# Patient Record
Sex: Female | Born: 1965
Health system: Southern US, Community
[De-identification: ages and names within clinical notes are randomized; demographics above are authoritative.]

## PROBLEM LIST (undated history)

## (undated) DIAGNOSIS — F419 Anxiety disorder, unspecified: Secondary | ICD-10-CM

## (undated) DIAGNOSIS — I10 Essential (primary) hypertension: Secondary | ICD-10-CM

## (undated) DIAGNOSIS — E039 Hypothyroidism, unspecified: Secondary | ICD-10-CM

## (undated) DIAGNOSIS — F329 Major depressive disorder, single episode, unspecified: Secondary | ICD-10-CM

## (undated) DIAGNOSIS — R002 Palpitations: Secondary | ICD-10-CM

## (undated) DIAGNOSIS — M069 Rheumatoid arthritis, unspecified: Secondary | ICD-10-CM

## (undated) DIAGNOSIS — K219 Gastro-esophageal reflux disease without esophagitis: Secondary | ICD-10-CM

## (undated) DIAGNOSIS — F32A Depression, unspecified: Secondary | ICD-10-CM

## (undated) DIAGNOSIS — E119 Type 2 diabetes mellitus without complications: Secondary | ICD-10-CM

## (undated) HISTORY — PX: CHOLECYSTECTOMY: SHX55

## (undated) HISTORY — DX: Gastro-esophageal reflux disease without esophagitis: K21.9

## (undated) HISTORY — PX: BLADDER SURGERY: SHX569

## (undated) HISTORY — DX: Essential (primary) hypertension: I10

## (undated) HISTORY — DX: Palpitations: R00.2

## (undated) HISTORY — DX: Major depressive disorder, single episode, unspecified: F32.9

## (undated) HISTORY — DX: Hypothyroidism, unspecified: E03.9

## (undated) HISTORY — DX: Depression, unspecified: F32.A

## (undated) HISTORY — DX: Anxiety disorder, unspecified: F41.9

---

## 2001-03-02 ENCOUNTER — Emergency Department (HOSPITAL_COMMUNITY): Admission: EM | Admit: 2001-03-02 | Discharge: 2001-03-02 | Payer: Self-pay | Admitting: Emergency Medicine

## 2001-07-10 ENCOUNTER — Emergency Department (HOSPITAL_COMMUNITY): Admission: EM | Admit: 2001-07-10 | Discharge: 2001-07-10 | Payer: Self-pay | Admitting: Emergency Medicine

## 2002-04-05 ENCOUNTER — Encounter: Admission: RE | Admit: 2002-04-05 | Discharge: 2002-04-05 | Payer: Self-pay | Admitting: Pulmonary Disease

## 2003-01-15 ENCOUNTER — Emergency Department (HOSPITAL_COMMUNITY): Admission: EM | Admit: 2003-01-15 | Discharge: 2003-01-15 | Payer: Self-pay | Admitting: Emergency Medicine

## 2003-01-21 ENCOUNTER — Encounter: Admission: RE | Admit: 2003-01-21 | Discharge: 2003-01-21 | Payer: Self-pay | Admitting: Pulmonary Disease

## 2003-02-09 ENCOUNTER — Ambulatory Visit (HOSPITAL_COMMUNITY): Admission: RE | Admit: 2003-02-09 | Discharge: 2003-02-09 | Payer: Self-pay | Admitting: Pulmonary Disease

## 2004-08-10 ENCOUNTER — Ambulatory Visit (HOSPITAL_BASED_OUTPATIENT_CLINIC_OR_DEPARTMENT_OTHER): Admission: RE | Admit: 2004-08-10 | Discharge: 2004-08-10 | Payer: Self-pay | Admitting: Urology

## 2004-08-10 ENCOUNTER — Ambulatory Visit (HOSPITAL_COMMUNITY): Admission: RE | Admit: 2004-08-10 | Discharge: 2004-08-10 | Payer: Self-pay | Admitting: Urology

## 2005-06-07 ENCOUNTER — Ambulatory Visit: Payer: Self-pay | Admitting: Cardiology

## 2005-06-19 ENCOUNTER — Ambulatory Visit: Payer: Self-pay | Admitting: Cardiology

## 2007-04-16 ENCOUNTER — Encounter: Admission: RE | Admit: 2007-04-16 | Discharge: 2007-04-16 | Payer: Self-pay | Admitting: Endocrinology

## 2007-07-01 ENCOUNTER — Ambulatory Visit: Payer: Self-pay | Admitting: Cardiology

## 2007-07-07 ENCOUNTER — Ambulatory Visit: Payer: Self-pay | Admitting: Cardiology

## 2007-07-28 ENCOUNTER — Ambulatory Visit: Payer: Self-pay | Admitting: Cardiology

## 2010-08-21 NOTE — Assessment & Plan Note (Signed)
[facility]                          [facility] OFFICE NOTE   DUHA, ABAIR                        MRN:          [id number]  DATE:07/28/2007                            DOB:          07-18-1965    HISTORY OF PRESENT ILLNESS:  The patient is a 45 year old female with a  history of a prior CT angiogram which __________.  The patient was  admitted with atypical chest pain on July 01, 2007, with a myocardial  infarction.  She underwent stress testing which was essentially within  normal limits.  The patient has been doing well.  She reports occasional  palpitations but no chest pain.  She does describe an epigastric pain.  I have recommended for her to go see the gastroenterologist.   MEDICATIONS:  Benicar/hydrochlorothiazide 20/12.5 mg q.a.m.   PHYSICAL EXAMINATION:  VITAL SIGNS:  Blood pressure 126/84, heart rate  84, weight 234 pounds.  NECK:  Normal carotid upstroke.  No carotid bruits.  LUNGS:  Clear.  Breath sounds bilaterally.  HEART:  A regular rate and rhythm with normal S1 and S2.  No murmurs,  rubs or gallops.  ABDOMEN:  Soft, nontender.  No rebound or guarding.  Good bowel sounds.  EXTREMITIES:  No clubbing, cyanosis or edema.  NEUROLOGIC:  The patient is alert and oriented and grossly nonfocal.   PROBLEMS:  1. Sinus tachycardia.  2. Atypical chest pain.  3. A lot of epigastric pain.   PLAN:  1. The patient was recommended to go see Dr. Lionel December for the      recurrent epigastric pain.  This does not appear to be angina or      chest pain.  2. The patient was given a prescription for atenolol 12.5 mg p.o.      daily.  3. The patient can follow up with [place] in six months to one year.     Learta Codding, MD,FACC  Electronically Signed    GED/MedQ  DD: 07/28/2007  DT: 07/28/2007  Job #: [id number]   cc:   Doreen Beam, MD

## 2010-08-24 NOTE — Op Note (Signed)
Linda Richmond, Linda Richmond                 ACCOUNT NO.:  [id number]   MEDICAL RECORD NO.:  [id number]          PATIENT TYPE:  AMB   LOCATION:  [facility]                         FACILITY:  [facility]   PHYSICIAN:  Jamison Neighbor, M.D.  DATE OF BIRTH:  [birth year]   DATE OF PROCEDURE:  08/10/2004  DATE OF DISCHARGE:                                 OPERATIVE REPORT   SERVICE:  Urology.   PREOPERATIVE DIAGNOSES:  1.  Interstitial cystitis.  2.  Hematuria.   POSTOPERATIVE DIAGNOSES:  1.  Interstitial cystitis.  2.  Hematuria.   PROCEDURE:  Cystoscopy, urethral calibration, hydrodistention of the  bladder, Marcaine and Pyridium installation, Marcaine and Kenalog injection.   SURGEON:  Jamison Neighbor, M.D.   ANESTHESIA:  General.   COMPLICATIONS:  None.   DRAINS:  None.   BRIEF HISTORY:  This 45 year old female has a past history of chronic pelvic  pain that was evaluated and found to be consistent with interstitial  cystitis. The patient was diagnosed by cystoscopy and hydrodistention and  had such a complete and total resolution of her symptoms that she did not  return for over five years. The patient had no symptoms until recently when  she began to develop recurrence of her pelvic pain, bloating, distention as  well as some pain on the right hand side. The patient underwent upper tract  evaluation with CT which showed no signs of stones or any other source for  hematuria. The hematuria settled down but the patient's pain is persistent.  She is now to undergo repeat hydrodistention to determine if she actually  still has problems with interstitial cystitis. She understands the risks and  benefits of the procedure. Specifically she realizes there is no guarantee  that her pain will respond in the same way that it did the last time and  that if she does a response it may not last as long. She also understands  that there are options for installation therapy and oral therapy to treat  this  disease long-term. She gave full and informed consent.   DESCRIPTION OF PROCEDURE:  After successful induction of general anesthesia,  the patient was placed in the dorsal lithotomy position, prepped with  Betadine and draped in the usual sterile fashion. Careful bimanual  examination revealed no cystocele or enterocele but there was a modest  rectocele. There were no masses on bimanual exam. The urethra was normal in  its appearance. There was no evidence of stenosis or stricture. It accepted  a 56 female urethral sound. The cystoscope was inserted, the bladder was  carefully inspected and was free of any tumor or stones. Both ureteral  orifices are normal in configuration and location. The patient had no  abnormalities of the mucosa. Hydrodistention of the bladder was performed.  The bladder was distended for 10 minutes at a pressure of 100 cmH2O. When  the bladder was drained, glomerulations were seen throughout the bladder,  the bladder capacity was normal at 1000 mL, there were no ulcers seen. The  patient did not require a biopsy. A mixture of Marcaine  and Pyridium was  left within the bladder, Marcaine and Kenalog were injected periurethrally.  The patient received intraoperative Toradol, zofran and a B&O suppository.  She will be sent home with Lorcet plus, Pyridium plus and Macrobid due to  numerous other drug allergies. We note that because of her multiple  allergies, she was given gentamycin preoperatively as opposed to a more  standard antibiotic prior to the cystoscopy. The patient will return to see  me in followup. If she wishes to embark on therapy will start her on a  combination of installation therapy plus Elmiron and Atarax in combination  with appropriate agents for pain management such as Effexor, Lyrica, Elavil,  etc.      RJE/MEDQ  D:  08/10/2004  T:  08/10/2004  Job:  [id number]   cc:   Oneal Deputy. Juanetta Gosling, M.D.  [address]  [place]  [place] [address]  Fax:  [phone]

## 2013-09-27 ENCOUNTER — Encounter: Payer: Self-pay | Admitting: *Deleted

## 2013-09-28 ENCOUNTER — Encounter: Payer: Self-pay | Admitting: *Deleted

## 2013-09-30 ENCOUNTER — Encounter: Payer: Self-pay | Admitting: Cardiology

## 2013-09-30 ENCOUNTER — Ambulatory Visit (INDEPENDENT_AMBULATORY_CARE_PROVIDER_SITE_OTHER): Payer: BC Managed Care – PPO | Admitting: Cardiology

## 2013-09-30 VITALS — BP 110/77 | HR 92 | Ht 61.0 in | Wt 258.0 lb

## 2013-09-30 DIAGNOSIS — R002 Palpitations: Secondary | ICD-10-CM

## 2013-09-30 MED ORDER — METOPROLOL SUCCINATE ER 50 MG PO TB24: 50.0000 mg | ORAL_TABLET | Freq: Every day | ORAL | Status: DC

## 2013-09-30 NOTE — Progress Notes (Signed)
Clinical Summary Ms. Gravette is a 48 y.o.female seen today as a new patient for palpitations  1. Palpitations - started several years ago, recently has become more frequent. States in her mid 86s had severe troubles with fast heart rates with ER visit, was lopressor for several years. She remembers the term "PAT", likely parox atach, being used around that time. Symptoms quited down and eventually she came off lopressor   -has had feeling of heart racing, + lightheadedness. Can have some nausea. Can last up to 5-20 minutes. Occuring 2-3 times a day. Can often be brought on by stress, but not exclusive to stress.  - TSH 6.3 Free T4 1.05. Reports symptoms often worst with synthroid, now off - no coffee, no sodas, only decaf tea. No EtOH - can have cramping feeling in left chest, down to left arm. Last < 1 min. Can happen at rest or with exertion. Occurs several times a week. Nothing makes better or worst - started on Toprol XL 25mg  daily without change in symptoms.   Past Medical History  Diagnosis Date  . Hypertension   . Palpitations   . Depression   . Anxiety   . GERD (gastroesophageal reflux disease)   . Hypothyroidism      Allergies  Allergen Reactions  . Ancef [Cefazolin]   . Aspirin   . Ciprofloxacin   . Doxycycline   . Penicillins   . Sulfa Antibiotics      Current Outpatient Prescriptions  Medication Sig Dispense Refill  . ALPRAZolam (XANAX) 0.5 MG tablet Take 0.25 mg by mouth every morning. & 0.5 mg at HS      . levothyroxine (SYNTHROID, LEVOTHROID) 137 MCG tablet Take 137 mcg by mouth daily before breakfast.      . metoprolol succinate (TOPROL-XL) 25 MG 24 hr tablet Take 25 mg by mouth daily.      olmesartan-hydrochlorothiazide (BENICAR HCT) 20-12.5 MG per tablet Take 1 tablet by mouth daily.      . pantoprazole (PROTONIX) 40 MG tablet Take 40 mg by mouth daily.       No current facility-administered medications for this visit.     Past Surgical  History  Procedure Laterality Date  . Cholecystectomy    . Bladder surgery      X's 3 for interstitial cystitis     Allergies  Allergen Reactions  . Ancef [Cefazolin]   . Aspirin   . Ciprofloxacin   . Doxycycline   . Penicillins   . Sulfa Antibiotics       No family history on file.   Social History Ms. Penman has no tobacco history on file. Ms. Langenderfer has no alcohol history on file.   Review of Systems CONSTITUTIONAL: No weight loss, fever, chills, weakness or fatigue.  HEENT: Eyes: No visual loss, blurred vision, double vision or yellow sclerae.No hearing loss, sneezing, congestion, runny nose or sore throat.  SKIN: No rash or itching.  CARDIOVASCULAR:per HPI  RESPIRATORY: No shortness of breath, cough or sputum.  GASTROINTESTINAL: No anorexia, nausea, vomiting or diarrhea. No abdominal pain or blood.  GENITOURINARY: No burning on urination, no polyuria NEUROLOGICAL: No headache, dizziness, syncope, paralysis, ataxia, numbness or tingling in the extremities. No change in bowel or bladder control.  MUSCULOSKELETAL: No muscle, back pain, joint pain or stiffness.  LYMPHATICS: No enlarged nodes. No history of splenectomy.  PSYCHIATRIC: No history of depression or anxiety.  ENDOCRINOLOGIC: No reports of sweating, cold or heat intolerance. No polyuria or polydipsia.  Claudette Laws  Physical Examination p 92 bp 110/77 Wt 258 lbs BMI 49 Gen: resting comfortably, no acute distress HEENT: no scleral icterus, pupils equal round and reactive, no palptable cervical adenopathy,  CV: RRR, no m/r/g, no JVD, no carotid bruits Resp: Clear to auscultation bilaterally GI: abdomen is soft, non-tender, non-distended, normal bowel sounds, no hepatosplenomegaly MSK: extremities are warm, no edema.  Skin: warm, no rash Neuro:  no focal deficits Psych: appropriate affect   Diagnostic Studies 03/2010 Echo LVEF "normal", possible diastolic dysfunction  EKG: NSR   Assessment and Plan  1.  Palpitations - will obtain 7 day event monitor - will increase Toprol XL to 50mg  daily   F/u 3-4 weeks      , M.D., F.A.C.C.

## 2013-09-30 NOTE — Patient Instructions (Signed)
   Increase Toprol XL to 50mg  daily  Continue all other medications.   Your physician has recommended that you wear an 7 day event monitor. Event monitors are medical devices that record the heart's electrical activity. Doctors most often these monitors to diagnose arrhythmias. Arrhythmias are problems with the speed or rhythm of the heartbeat. The monitor is a small, portable device. You can wear one while you do your normal daily activities. This is usually used to diagnose what is causing palpitations/syncope (passing out). Office will contact with results via phone or letter.   Follow up in  3-4 weeks

## 2013-10-05 ENCOUNTER — Other Ambulatory Visit: Payer: Self-pay | Admitting: *Deleted

## 2013-10-05 DIAGNOSIS — R002 Palpitations: Secondary | ICD-10-CM

## 2013-10-12 DIAGNOSIS — R002 Palpitations: Secondary | ICD-10-CM

## 2013-10-20 ENCOUNTER — Telehealth: Payer: Self-pay | Admitting: Cardiology

## 2013-10-20 NOTE — Telephone Encounter (Signed)
Discussed below with patient.  Stated that we have already received her end of service report.  Advised to keep her follow up as scheduled.  Patient verbalized understanding.

## 2013-10-20 NOTE — Telephone Encounter (Signed)
She just mailed off her monitor   Would like to know if she needs to reschedule her upcoming appointment

## 2013-10-25 ENCOUNTER — Encounter: Payer: Self-pay | Admitting: Cardiology

## 2013-10-25 ENCOUNTER — Ambulatory Visit (INDEPENDENT_AMBULATORY_CARE_PROVIDER_SITE_OTHER): Payer: BC Managed Care – PPO | Admitting: Cardiology

## 2013-10-25 VITALS — BP 136/80 | HR 75 | Ht 62.0 in | Wt 256.0 lb

## 2013-10-25 DIAGNOSIS — R002 Palpitations: Secondary | ICD-10-CM

## 2013-10-25 NOTE — Patient Instructions (Signed)
Continue all current medications. Your physician wants you to follow up in:  1 year.  You will receive a reminder letter in the mail one-two months in advance.  If you don't receive a letter, please call our office to schedule the follow up appointment   

## 2013-10-25 NOTE — Progress Notes (Signed)
Clinical Summary [name] is a 48 y.o.female seen today for follow up of the following medical problems.   1. Palpitations  - started several years ago,  recently have become more frequent. States in her mid 67s had severe troubles with fast heart rates with ER visit, was lopressor for several years. She remembers the term "PAT", likely parox atach, being used around that time. Symptoms quieted down and eventually she came off lopressor  -TSH 6.3 Free T4 1.05. Reports symptoms often worst with synthroid, now off  - no coffee, no sodas, only decaf tea. No EtOH   - since last visit comleted 7 day event monitor which showed no arrhythmias. We increased her Toprol XL, she reports significant improvement in her symptoms.    Past Medical History  Diagnosis Date  . Hypertension   . Palpitations   . Depression   . Anxiety   . GERD (gastroesophageal reflux disease)   . Hypothyroidism      Allergies  Allergen Reactions  . Ancef [Cefazolin]   . Aspirin   . Ciprofloxacin   . Doxycycline   . Penicillins   . Sulfa Antibiotics      Current Outpatient Prescriptions  Medication Sig Dispense Refill  . ALPRAZolam (XANAX) 0.5 MG tablet Take 0.25 mg by mouth as needed.       . metoprolol succinate (TOPROL-XL) 50 MG 24 hr tablet Take 1 tablet (50 mg total) by mouth daily.  30 tablet  6  . olmesartan (BENICAR) 20 MG tablet Take 20 mg by mouth daily. Except Tuesday and Friday      . olmesartan-hydrochlorothiazide (BENICAR HCT) 20-12.5 MG per tablet Take 1 tablet by mouth 2 (two) times a week. Tuesday and Friday      . pantoprazole (PROTONIX) 40 MG tablet Take 40 mg by mouth daily as needed.        No current facility-administered medications for this visit.     Past Surgical History  Procedure Laterality Date  . Cholecystectomy    . Bladder surgery      X's 3 for interstitial cystitis     Allergies  Allergen Reactions  . Ancef [Cefazolin]   . Aspirin   . Ciprofloxacin   .  Doxycycline   . Penicillins   . Sulfa Antibiotics       No family history on file.   Social History [name] reports that she has never smoked. She has never used smokeless tobacco. [name] has no alcohol history on file.   Review of Systems CONSTITUTIONAL: No weight loss, fever, chills, weakness or fatigue.  HEENT: Eyes: No visual loss, blurred vision, double vision or yellow sclerae.No hearing loss, sneezing, congestion, runny nose or sore throat.  SKIN: No rash or itching.  CARDIOVASCULAR: per HPI RESPIRATORY: No shortness of breath, cough or sputum.  GASTROINTESTINAL: No anorexia, nausea, vomiting or diarrhea. No abdominal pain or blood.  GENITOURINARY: No burning on urination, no polyuria NEUROLOGICAL: No headache, dizziness, syncope, paralysis, ataxia, numbness or tingling in the extremities. No change in bowel or bladder control.  MUSCULOSKELETAL: No muscle, back pain, joint pain or stiffness.  LYMPHATICS: No enlarged nodes. No history of splenectomy.  PSYCHIATRIC: No history of depression or anxiety.  ENDOCRINOLOGIC: No reports of sweating, cold or heat intolerance. No polyuria or polydipsia.  Marland Kitchen   Physical Examination p 75 bp 136/80 Wt 256 lbs BMI 47 Gen: resting comfortably, no acute distress HEENT: no scleral icterus, pupils equal round and reactive, no  palptable cervical adenopathy,  CV: RRR, no m/r/g, no JVD, no carotid bruits Resp: Clear to auscultation bilaterally GI: abdomen is soft, non-tender, non-distended, normal bowel sounds, no hepatosplenomegaly MSK: extremities are warm, no edema.  Skin: warm, no rash Neuro:  no focal deficits Psych: appropriate affect   Diagnostic Studies 03/2010 Echo  LVEF "normal", possible diastolic dysfunction   EKG: NSR  10/2013 7 Day Event Monitor No symptoms. No arrhythmias    Assessment and Plan  1. Palpitations  - no significant arrhythmias on recent event monitor - symptoms improved off synthroid and with  increased Toprol XL - continue current therapy   F/u 1 year     Antoine Poche, M.D., F.A.C.C.

## 2014-10-24 ENCOUNTER — Encounter: Payer: Self-pay | Admitting: *Deleted

## 2014-10-25 ENCOUNTER — Encounter: Payer: Self-pay | Admitting: Cardiology

## 2014-10-25 NOTE — Progress Notes (Signed)
Patient ID: Linda Richmond, female   DOB: Apr 07, 1966, 49 y.o.   MRN: [id number]   Encounter opened in error

## 2014-11-15 ENCOUNTER — Ambulatory Visit: Payer: Self-pay | Admitting: Cardiology

## 2015-10-09 ENCOUNTER — Emergency Department (HOSPITAL_COMMUNITY): Payer: Self-pay

## 2015-10-09 ENCOUNTER — Emergency Department (HOSPITAL_COMMUNITY)
Admission: EM | Admit: 2015-10-09 | Discharge: 2015-10-09 | Disposition: A | Discharge status: Home / Self Care | Payer: Self-pay | Attending: Emergency Medicine | Admitting: Emergency Medicine

## 2015-10-09 ENCOUNTER — Other Ambulatory Visit: Payer: Self-pay

## 2015-10-09 ENCOUNTER — Encounter (HOSPITAL_COMMUNITY): Payer: Self-pay | Admitting: *Deleted

## 2015-10-09 DIAGNOSIS — I10 Essential (primary) hypertension: Secondary | ICD-10-CM | POA: Insufficient documentation

## 2015-10-09 DIAGNOSIS — Z79899 Other long term (current) drug therapy: Secondary | ICD-10-CM | POA: Insufficient documentation

## 2015-10-09 DIAGNOSIS — R079 Chest pain, unspecified: Secondary | ICD-10-CM | POA: Insufficient documentation

## 2015-10-09 DIAGNOSIS — E119 Type 2 diabetes mellitus without complications: Secondary | ICD-10-CM | POA: Insufficient documentation

## 2015-10-09 DIAGNOSIS — F329 Major depressive disorder, single episode, unspecified: Secondary | ICD-10-CM | POA: Insufficient documentation

## 2015-10-09 DIAGNOSIS — E039 Hypothyroidism, unspecified: Secondary | ICD-10-CM | POA: Insufficient documentation

## 2015-10-09 HISTORY — DX: Type 2 diabetes mellitus without complications: E11.9

## 2015-10-09 LAB — CBC WITH DIFFERENTIAL/PLATELET
BASOS ABS: 0 10*3/uL (ref 0.0–0.1)
Basophils Relative: 0 %
EOS PCT: 2 %
Eosinophils Absolute: 0.2 10*3/uL (ref 0.0–0.7)
HEMATOCRIT: 46.1 % — AB (ref 36.0–46.0)
Hemoglobin: 15 g/dL (ref 12.0–15.0)
LYMPHS ABS: 3.6 10*3/uL (ref 0.7–4.0)
LYMPHS PCT: 32 %
MCH: 30 pg (ref 26.0–34.0)
MCHC: 32.5 g/dL (ref 30.0–36.0)
MCV: 92.2 fL (ref 78.0–100.0)
MONO ABS: 1.1 10*3/uL — AB (ref 0.1–1.0)
MONOS PCT: 10 %
NEUTROS ABS: 6.2 10*3/uL (ref 1.7–7.7)
Neutrophils Relative %: 56 %
Platelets: 210 10*3/uL (ref 150–400)
RBC: 5 MIL/uL (ref 3.87–5.11)
RDW: 14.4 % (ref 11.5–15.5)
WBC: 11.2 10*3/uL — ABNORMAL HIGH (ref 4.0–10.5)

## 2015-10-09 LAB — BASIC METABOLIC PANEL
Anion gap: 9 (ref 5–15)
BUN: 13 mg/dL (ref 6–20)
CHLORIDE: 104 mmol/L (ref 101–111)
CO2: 23 mmol/L (ref 22–32)
CREATININE: 0.67 mg/dL (ref 0.44–1.00)
Calcium: 8.7 mg/dL — ABNORMAL LOW (ref 8.9–10.3)
GFR calc Af Amer: >60 mL/min (ref >60)
GFR calc non Af Amer: >60 mL/min (ref >60)
GLUCOSE: 102 mg/dL — AB (ref 65–99)
POTASSIUM: 4.1 mmol/L (ref 3.5–5.1)
Sodium: 136 mmol/L (ref 135–145)

## 2015-10-09 LAB — HEPATIC FUNCTION PANEL
ALT: 16 U/L (ref 14–54)
AST: 21 U/L (ref 15–41)
Albumin: 3.7 g/dL (ref 3.5–5.0)
Alkaline Phosphatase: 55 U/L (ref 38–126)
BILIRUBIN DIRECT: 0.1 mg/dL (ref 0.1–0.5)
BILIRUBIN INDIRECT: 0.4 mg/dL (ref 0.3–0.9)
BILIRUBIN TOTAL: 0.5 mg/dL (ref 0.3–1.2)
Total Protein: 7.3 g/dL (ref 6.5–8.1)

## 2015-10-09 LAB — TROPONIN I: Troponin I: <0.03 ng/mL (ref <0.03)

## 2015-10-09 MED ORDER — NAPROXEN 500 MG PO TABS: 500.0000 mg | ORAL_TABLET | Freq: Two times a day (BID) | ORAL | Status: DC

## 2015-10-09 NOTE — ED Notes (Signed)
Gave crackers and po fluid.

## 2015-10-09 NOTE — ED Notes (Signed)
Pt comes in with chest pain starting yesterday morning. Pt denies any n/v/d or cough.

## 2015-10-09 NOTE — Discharge Instructions (Signed)
Follow up with your md later this week for recheck °

## 2015-10-09 NOTE — ED Provider Notes (Signed)
CSN: [id number]     Arrival date & time 10/09/15  1412 History   First MD Initiated Contact with Patient 10/09/15 1627     Chief Complaint  Patient presents with  . Chest Pain     (Consider location/radiation/quality/duration/timing/severity/associated sxs/prior Treatment) Patient is a 50 y.o. female presenting with chest pain. The history is provided by the patient (The patient states that she's been having chest pain off-and-on for 2 days now pain lasts for less than a minute.  Pain is not worse with inspiration).  Chest Pain Pain location:  R chest Pain quality: aching   Pain radiates to:  Does not radiate Pain radiates to the back: no   Pain severity:  Mild Onset quality:  Sudden Timing:  Intermittent   Past Medical History  Diagnosis Date  . Hypertension   . Palpitations   . Depression   . Anxiety   . GERD (gastroesophageal reflux disease)   . Hypothyroidism   . Diabetes mellitus without complication [facility])    Past Surgical History  Procedure Laterality Date  . Cholecystectomy    . Bladder surgery      X's 3 for interstitial cystitis   No family history on file. Social History  Substance Use Topics  . Smoking status: Never Smoker   . Smokeless tobacco: Never Used  . Alcohol Use: No   OB History    No data available     Review of Systems  Cardiovascular: Positive for chest pain.      Allergies  Ancef; Aspirin; Doxycycline; Sulfa antibiotics; Ciprofloxacin; and Penicillins  Home Medications   Prior to Admission medications   Medication Sig Start Date End Date Taking? Authorizing Provider  ALPRAZolam Prudy Feeler) 0.5 MG tablet Take 0.25-0.5 mg by mouth daily as needed for anxiety.    Yes Historical Provider, MD  olmesartan (BENICAR) 20 MG tablet Take 20 mg by mouth daily.    Yes Historical Provider, MD  pantoprazole (PROTONIX) 40 MG tablet Take 40 mg by mouth daily as needed (for GERD/Avid reflux).    Yes Historical Provider, MD  naproxen (NAPROSYN) 500 MG  tablet Take 1 tablet (500 mg total) by mouth 2 (two) times daily. 10/09/15   Bethann Berkshire, MD   BP 134/78 mmHg  Pulse 76  Temp(Src) 98.4 F (36.9 C) (Oral)  Resp 16  Ht 5\' 2"  (1.575 m)  Wt 241 lb (109.317 kg)  BMI 44.07 kg/m2  SpO2 100%  LMP 10/02/2015 Physical Exam  Constitutional: She is oriented to person, place, and time. She appears well-developed.  HENT:  Head: Normocephalic.  Eyes: Conjunctivae and EOM are normal. No scleral icterus.  Neck: Neck supple. No thyromegaly present.  Cardiovascular: Normal rate and regular rhythm.  Exam reveals no gallop and no friction rub.   No murmur heard. Pulmonary/Chest: No stridor. She has no wheezes. She has no rales. She exhibits no tenderness.  Abdominal: She exhibits no distension. There is no tenderness. There is no rebound.  Musculoskeletal: Normal range of motion. She exhibits no edema.  Lymphadenopathy:    She has no cervical adenopathy.  Neurological: She is oriented to person, place, and time. She exhibits normal muscle tone. Coordination normal.  Skin: No rash noted. No erythema.  Psychiatric: She has a normal mood and affect. Her behavior is normal.    ED Course  Procedures (including critical care time) Labs Review Labs Reviewed  BASIC METABOLIC PANEL - Abnormal; Notable for the following:    Glucose, Bld 102 (*)  Calcium 8.7 (*)    All other components within normal limits  CBC WITH DIFFERENTIAL/PLATELET - Abnormal; Notable for the following:    WBC 11.2 (*)    HCT 46.1 (*)    Monocytes Absolute 1.1 (*)    All other components within normal limits  TROPONIN I  HEPATIC FUNCTION PANEL  TROPONIN I  CBC    Imaging Review Dg Chest 2 View  10/09/2015  CLINICAL DATA:  Chest pain and weakness, diabetes mellitus, hypertension, history of palpitations and tachycardia EXAM: CHEST  2 VIEW COMPARISON:  08/23/2014 FINDINGS: Normal heart size, mediastinal contours, and pulmonary vascularity. Minimal chronic bronchitic  changes. No acute infiltrate, pleural effusion or pneumothorax. Bones unremarkable. IMPRESSION: No acute abnormalities. Minimal chronic bronchitic changes. Electronically Signed   By: Ulyses Southward M.D.   On: 10/09/2015 16:53   I have personally reviewed and evaluated these images and lab results as part of my medical decision-making.   EKG Interpretation   Date/Time:  Monday October 09 2015 14:17:46 EDT Ventricular Rate:  91 PR Interval:  134 QRS Duration: 82 QT Interval:  354 QTC Calculation: 435 R Axis:   9 Text Interpretation:  Normal sinus rhythm Cannot rule out Anterior infarct  , age undetermined Abnormal ECG Confirmed by Teofilo Lupinacci  MD, Shirah Roseman [phone]) on  10/09/2015 4:37:37 PM      MDM   Final diagnoses:  Chest pain at rest    Chest x-ray suggests bronchitis. Labs including 2 troponins were normal. EKG unremarkable. Doubt coronary artery disease. Also doubt PE since pain is not related to inspiration and only lasts for 30-45 seconds. Patient will be put on Naprosyn and will follow-up with her PCP    Bethann Berkshire, MD 10/09/15 2104

## 2016-04-11 ENCOUNTER — Encounter (INDEPENDENT_AMBULATORY_CARE_PROVIDER_SITE_OTHER): Payer: Self-pay | Admitting: Orthopaedic Surgery

## 2016-04-11 ENCOUNTER — Ambulatory Visit (INDEPENDENT_AMBULATORY_CARE_PROVIDER_SITE_OTHER): Payer: BLUE CROSS/BLUE SHIELD | Admitting: Orthopaedic Surgery

## 2016-04-11 ENCOUNTER — Ambulatory Visit (INDEPENDENT_AMBULATORY_CARE_PROVIDER_SITE_OTHER): Payer: BLUE CROSS/BLUE SHIELD

## 2016-04-11 VITALS — BP 119/80 | HR 90 | Ht 61.0 in | Wt 260.0 lb

## 2016-04-11 DIAGNOSIS — M542 Cervicalgia: Secondary | ICD-10-CM

## 2016-04-11 DIAGNOSIS — M25511 Pain in right shoulder: Secondary | ICD-10-CM | POA: Diagnosis not present

## 2016-04-11 DIAGNOSIS — M1A00X0 Idiopathic chronic gout, unspecified site, without tophus (tophi): Secondary | ICD-10-CM

## 2016-04-11 NOTE — Progress Notes (Signed)
Office Visit Note   Patient: Linda Richmond           Date of Birth: 11/08/1965           MRN: [id number] Visit Date: 04/11/2016              Requested by: Ignatius Specking, MD [address] [place], [place] [address] PCP: Ignatius Specking, MD   Assessment & Plan: Visit Diagnoses:  1. Right shoulder pain, unspecified chronicity   2. Neck pain   3. Idiopathic chronic gout without tophus, unspecified site     Plan: With patient's initial left shoulder problems and migratory to the right shoulder and problems with her neck also aching pains in her feet and past history of gout this likely is caused by some hyperuricemia. She switched insurance plans and stopped her L appear all around Thanksgiving. She'll take 2 Aleve twice a day and after 5 day she can start allopurinol 100 mg daily for one week then go to 2 tablets daily and after that she can talk with Dr. Marilynn Latino office about increasing this to 300 mg daily. Patient has some cervical spondylosis and on exam has some mild impingement of her shoulder but I think her principal problem is gout. I'll recheck her in a month.  Follow-Up Instructions: No Follow-up on file.   Orders:  Orders Placed This Encounter  Procedures  . XR Shoulder Right  . XR Cervical Spine 2 or 3 views   No orders of the defined types were placed in this encounter.     Procedures: No procedures performed   Clinical Data: No additional findings.   Subjective: Chief Complaint  Patient presents with  . Right Shoulder - Pain  . Left Shoulder - Pain    Patient has complaint of bilateral shoulder pain right greater than left. She states that she has no known injury, and that the pain is worse when her arm is dangling or when she is stretching.  She has had pain in the left for three weeks and pain in the right for 1 1/2 weeks.  She is using ice, heat, and icy hot.  She is unable to take NSAIDS due to a history of ulcers.    Review of Systems  Constitutional: Negative for  chills and diaphoresis.  HENT: Negative for ear discharge, ear pain and nosebleeds.   Eyes: Negative for discharge and visual disturbance.  Respiratory: Negative for cough, choking and shortness of breath.   Cardiovascular: Negative for chest pain and palpitations.  Gastrointestinal: Negative for abdominal distention and abdominal pain.  Endocrine: Negative for cold intolerance and heat intolerance.  Genitourinary: Negative for flank pain and hematuria.  Musculoskeletal:       Positive history of gout using her ankle sometimes in her knees. Patient's used to heat ice mineralized without relief. She complains of pain with turning her neck.  Skin: Negative for rash and wound.  Neurological: Negative for seizures and speech difficulty.  Hematological: Negative for adenopathy. Does not bruise/bleed easily.  Psychiatric/Behavioral: Negative for agitation and suicidal ideas.     Objective: Vital Signs: BP 119/80   Pulse 90   Ht 5\' 1"  (1.549 m)   Wt 260 lb (117.9 kg)   BMI 49.13 kg/m   Physical Exam  Constitutional: She is oriented to person, place, and time. She appears well-developed.  Positive for increased BMI  HENT:  Head: Normocephalic.  Right Ear: External ear normal.  Left Ear: External ear normal.  Eyes: Pupils are equal, round, and reactive to light.  Neck: No tracheal deviation present. No thyromegaly present.  Cardiovascular: Normal rate.   Pulmonary/Chest: Effort normal.  Abdominal: Soft.  Musculoskeletal:  Vision is discomfort with rotation of her neck but complex tenderness bilaterally increased pain with cervical compression some positive impingement both shoulders. Shoulder arm across her chest on the right side. Left shoulder is moving better than it was last week when her left shoulder is giving her significant pain. Normal heel toe gait ME reflexes are 2+ and symmetrical normal motor strength upper and lower extremity on exam.  Neurological: She is alert and  oriented to person, place, and time.  Skin: Skin is warm and dry.  Psychiatric: She has a normal mood and affect. Her behavior is normal.    Ortho Exam  Specialty Comments:  No specialty comments available.  Imaging: Xr Cervical Spine 2 Or 3 Views  Result Date: 04/11/2016 Spine x-rays were obtained AP and lateral. This shows cervical spondylosis with the anterior and posterior spurring disc space narrowing the mid cervical region and some loss of normal lordosis curvature. No spondylolisthesis. Impression: Mid cervical spondylosis. No acute changes.  Xr Shoulder Right  Result Date: 04/11/2016 2 views right shoulder obtained. This shows  normal glenohumeral joint. Joint is well located no spurring is noted no soft tissue calcification. Impression: Normal right shoulder x-rays.    PMFS History: Patient Active Problem List   Diagnosis Date Noted  . Neck pain 04/11/2016  . Right shoulder pain 04/11/2016  . Palpitations 09/30/2013   Past Medical History:  Diagnosis Date  . Anxiety   . Depression   . Diabetes mellitus without complication (HCC)   . GERD (gastroesophageal reflux disease)   . Hypertension   . Hypothyroidism   . Palpitations     No family history on file.  Past Surgical History:  Procedure Laterality Date  . BLADDER SURGERY     X's 3 for interstitial cystitis  . CHOLECYSTECTOMY     Social History   Occupational History  . Not on file.   Social History Main Topics  . Smoking status: Never Smoker  . Smokeless tobacco: Never Used  . Alcohol use No  . Drug use: No  . Sexual activity: Not on file

## 2016-05-30 ENCOUNTER — Ambulatory Visit (INDEPENDENT_AMBULATORY_CARE_PROVIDER_SITE_OTHER): Payer: BLUE CROSS/BLUE SHIELD | Admitting: Orthopaedic Surgery

## 2016-07-18 ENCOUNTER — Encounter (INDEPENDENT_AMBULATORY_CARE_PROVIDER_SITE_OTHER): Payer: Self-pay | Admitting: Orthopaedic Surgery

## 2016-07-18 ENCOUNTER — Ambulatory Visit (INDEPENDENT_AMBULATORY_CARE_PROVIDER_SITE_OTHER): Payer: BLUE CROSS/BLUE SHIELD | Admitting: Orthopaedic Surgery

## 2016-07-18 VITALS — BP 117/71 | HR 87 | Ht 61.0 in | Wt 270.0 lb

## 2016-07-18 DIAGNOSIS — M47812 Spondylosis without myelopathy or radiculopathy, cervical region: Secondary | ICD-10-CM

## 2016-07-18 DIAGNOSIS — M542 Cervicalgia: Secondary | ICD-10-CM | POA: Diagnosis not present

## 2016-07-21 NOTE — Progress Notes (Signed)
Office Visit Note   Patient: Linda Richmond           Date of Birth: 29-Nov-1965           MRN: [id number] Visit Date: 07/18/2016              Requested by: Ignatius Specking, MD [address] [place], [place] [address] PCP: Ignatius Specking, MD   Assessment & Plan: Visit Diagnoses:  1. Neck pain   2. Other spondylosis with myelopathy, cervical region     Plan: MRI ordered for midcervical spondylosis with radicular arm symptoms radiating to the wrist now bilateral.  Follow-Up Instructions: No Follow-up on file.   Orders:  Orders Placed This Encounter  Procedures  . MR Cervical Spine w/o contrast   No orders of the defined types were placed in this encounter.     Procedures: No procedures performed   Clinical Data: No additional findings.   Subjective: Chief Complaint  Patient presents with  . Neck - Pain  . Right Shoulder - Pain  . Left Elbow - Pain  . Left Wrist - Pain    HPI patient's had more than 4 month history of both right and left shoulder pain that radiates down to her hands. Initially was on the right now more on the left. She has difficulty lifting her left arm at times and feels like her left arm is weak. He has been taking her allopurinol on a daily basis 300 mg is not taking any anti-inflammatories. Symptoms do not wax and wane. She has pain when she turns and rotates her neck. She's noticed numbness that radiates down to her left hand on the radial side. She denies any leg weakness no gait disturbance. No bowel or bladder symptoms she denies fever and chills. Pain is worse if she is working and her next in a flexed position for an  extended period time.  Review of Systems  Constitutional: Negative for chills and diaphoresis.  HENT: Negative for ear discharge, ear pain and nosebleeds.   Eyes: Negative for discharge and visual disturbance.  Respiratory: Negative for cough, choking and shortness of breath.   Cardiovascular: Negative for chest pain and palpitations.    Gastrointestinal: Negative for abdominal distention and abdominal pain.  Endocrine: Negative for cold intolerance and heat intolerance.  Genitourinary: Negative for flank pain and hematuria.  Musculoskeletal: Positive for neck pain.       Positive for history of gout. Neck and left greater than right arm pain.  Skin: Negative for rash and wound.  Neurological: Negative for seizures and speech difficulty.  Hematological: Negative for adenopathy. Does not bruise/bleed easily.  Psychiatric/Behavioral: Negative for agitation and suicidal ideas.     Objective: Vital Signs: BP 117/71   Pulse 87   Ht 5\' 1"  (1.549 m)   Wt 270 lb (122.5 kg)   BMI 51.02 kg/m   Physical Exam  Constitutional: She is oriented to person, place, and time. She appears well-developed.  HENT:  Head: Normocephalic.  Right Ear: External ear normal.  Left Ear: External ear normal.  Eyes: Pupils are equal, round, and reactive to light.  Neck: No tracheal deviation present. No thyromegaly present.  Cardiovascular: Normal rate.   Pulmonary/Chest: Effort normal.  Abdominal: Soft.  Musculoskeletal:  Patient is for flexion chin 2 finger breaths the chest. Brachial plexus tenderness more on the left than right. Positive Spurling on the left negative on the right. Negative rate. M agree reflexes are 2+ and symmetrical.  Lower extremity reflexes are 2+ no hyperreflexia. No shoulder impingement. Lung of the biceps is nontender. Negative empty can test. Internal and external rotation is intact. It caught anterior tib gastrocsoleus strength. Tenderness in the ulnar nerve at the elbow median nerve at the wrist in the form is normal. Compression over the carpal canal negative Phalen's test. No biceps triceps atrophy. Wrist flexion extension takes the symmetrical resistance.  Neurological: She is alert and oriented to person, place, and time.  Skin: Skin is warm and dry.  Psychiatric: She has a normal mood and affect. Her behavior  is normal.    Ortho Exam  Specialty Comments:  No specialty comments available.  Imaging: No results found.   PMFS History: Patient Active Problem List   Diagnosis Date Noted  . Neck pain 04/11/2016  . Right shoulder pain 04/11/2016  . Palpitations 09/30/2013   Past Medical History:  Diagnosis Date  . Anxiety   . Depression   . Diabetes mellitus without complication (HCC)   . GERD (gastroesophageal reflux disease)   . Hypertension   . Hypothyroidism   . Palpitations     No family history on file.  Past Surgical History:  Procedure Laterality Date  . BLADDER SURGERY     X's 3 for interstitial cystitis  . CHOLECYSTECTOMY     Social History   Occupational History  . Not on file.   Social History Main Topics  . Smoking status: Never Smoker  . Smokeless tobacco: Never Used  . Alcohol use No  . Drug use: No  . Sexual activity: Not on file

## 2016-07-25 ENCOUNTER — Encounter (INDEPENDENT_AMBULATORY_CARE_PROVIDER_SITE_OTHER): Payer: Self-pay | Admitting: Orthopaedic Surgery

## 2016-07-25 ENCOUNTER — Ambulatory Visit (INDEPENDENT_AMBULATORY_CARE_PROVIDER_SITE_OTHER): Payer: BLUE CROSS/BLUE SHIELD | Admitting: Orthopaedic Surgery

## 2016-07-25 VITALS — BP 132/78 | HR 89 | Ht 61.0 in | Wt 270.0 lb

## 2016-07-25 DIAGNOSIS — M4722 Other spondylosis with radiculopathy, cervical region: Secondary | ICD-10-CM | POA: Diagnosis not present

## 2016-07-25 NOTE — Progress Notes (Signed)
Office Visit Note   Patient: Linda Richmond           Date of Birth: 08/10/1965           MRN: [id number] Visit Date: 07/25/2016              Requested by: Ignatius Specking, MD [address] [place], [place] [address] PCP: Ignatius Specking, MD   Assessment & Plan: Visit Diagnoses:  1. Other spondylosis with radiculopathy, cervical region            Disc protrusion right paracentral C5-6 with the nerve root impingement and to a lesser degree C6-7.  Plan: We'll proceed with physical therapy recheck her in 4 weeks. We reviewed the MRI scan. I gave her a copy of the report.Paracentral disc protrusion C5-6 likely the cause of her symptoms and she does have disease at C6-7 with disc protrusion as well. Tiny bulge at C4-5 without compression. Follow-Up Instructions: Return in about 4 weeks (around 08/22/2016).   Orders:  No orders of the defined types were placed in this encounter.  No orders of the defined types were placed in this encounter.     Procedures: No procedures performed   Clinical Data: No additional findings.   Subjective: Chief Complaint  Patient presents with  . Neck - Pain    HPI patient is having ongoing persistent symptoms in her neck. She is having more pain in her right shoulder and now more recently started have pain that radiates in her left shoulder down to her left elbow. She some numbness in her hand. 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 She has trouble going to sleep and bothers her on a daily basis. When she lifts heavy objects she has sharp pain in her neck that radiates into her arm. MRI scan has been obtained and is available for review. She she had weakness in her legs last week and had to borrow her grandmother's walker for a few days and states her legs are doing better at this point. She denies associated bowel or bladder symptoms. She does have gout and is on allopurinol 300 mg daily. She denies fever chills.  Review of  Systems review of systems updated from last office visit 7 days ago. She does have history of gout left greater than right arm pain neck pain with range of motion and weakness in her arm. She is taking her allopurinol regularly.   Objective: Vital Signs: BP 132/78   Pulse 89   Ht 5\' 1"  (1.549 m)   Wt 270 lb (122.5 kg)   BMI 51.02 kg/m   Physical Exam  Constitutional: She is oriented to person, place, and time. She appears well-developed.  HENT:  Head: Normocephalic.  Right Ear: External ear normal.  Left Ear: External ear normal.  Eyes: Pupils are equal, round, and reactive to light.  Neck: No tracheal deviation present. No thyromegaly present.  Cardiovascular: Normal rate.   Pulmonary/Chest: Effort normal.  Abdominal: Soft.  Neurological: She is alert and oriented to person, place, and time.  Skin: Skin is warm and dry.  Psychiatric: She has a normal mood and affect. Her behavior is normal.    Ortho Exam patient has pain with flexion to finger breast: The chest. She has pressure plexus tenderness on both sides but worse on the left. Negative Lhermitte. No supraclavicular lymphadenopathy. A tear cuff testing is normal right and left. Upper extremity tenderness over median nerve at  the forearm and wrist ulnar nerve at the elbow. Good flexion-extension resistance testing in her wrist, flexor profundi and interossei are normal.  Specialty Comments:  No specialty comments available.  Imaging: Patient has some mild disc bulge at C4-5. Significant disc bulge right paracentral into the foramina at C5-6 consistent with her symptoms. There is potential right C6 nerve root impingement. No significant central stenosis. C6-7 has disc bulge which is also right central. MRI was performed on 07/24/2016. Read by Dr. Beatriz Chancellor   PMFS History: Patient Active Problem List   Diagnosis Date Noted  . Neck pain 04/11/2016  . Right shoulder pain 04/11/2016  . Palpitations 09/30/2013   Past  Medical History:  Diagnosis Date  . Anxiety   . Depression   . Diabetes mellitus without complication (HCC)   . GERD (gastroesophageal reflux disease)   . Hypertension   . Hypothyroidism   . Palpitations     No family history on file.  Past Surgical History:  Procedure Laterality Date  . BLADDER SURGERY     X's 3 for interstitial cystitis  . CHOLECYSTECTOMY     Social History   Occupational History  . Not on file.   Social History Main Topics  . Smoking status: Never Smoker  . Smokeless tobacco: Never Used  . Alcohol use No  . Drug use: No  . Sexual activity: Not on file

## 2016-08-22 ENCOUNTER — Ambulatory Visit (INDEPENDENT_AMBULATORY_CARE_PROVIDER_SITE_OTHER): Payer: BLUE CROSS/BLUE SHIELD | Admitting: Orthopaedic Surgery

## 2016-08-23 ENCOUNTER — Ambulatory Visit (INDEPENDENT_AMBULATORY_CARE_PROVIDER_SITE_OTHER): Payer: BLUE CROSS/BLUE SHIELD | Admitting: Cardiology

## 2016-08-23 ENCOUNTER — Encounter: Payer: Self-pay | Admitting: Cardiology

## 2016-08-23 VITALS — BP 159/81 | HR 93 | Ht 62.0 in | Wt 279.2 lb

## 2016-08-23 DIAGNOSIS — R600 Localized edema: Secondary | ICD-10-CM | POA: Diagnosis not present

## 2016-08-23 DIAGNOSIS — I10 Essential (primary) hypertension: Secondary | ICD-10-CM

## 2016-08-23 DIAGNOSIS — R002 Palpitations: Secondary | ICD-10-CM | POA: Diagnosis not present

## 2016-08-23 MED ORDER — FUROSEMIDE 20 MG PO TABS: 20.0000 mg | ORAL_TABLET | Freq: Every day | ORAL | 6 refills | Status: AC | PRN

## 2016-08-23 NOTE — Progress Notes (Signed)
Clinical Summary Ms. Kelliher is a 51 y.o.female  seen today for follow up of the following medical problems.   1. Palpitations  - started several years ago,  recently have become more frequent. States in her mid 38s had severe troubles with fast heart rates with ER visit, was lopressor for several years. She remembers the term "PAT", likely parox atach, being used around that time. Symptoms quieted down and eventually she came off lopressor  -TSH 6.3 Free T4 1.05. Reports symptoms often worst with synthroid, now off  - no coffee, no sodas, only decaf tea. No EtOH     - no recent palpitations. Off synthroid. She is off Toprol for unclear reasons  2. HTN - home bp's have been elevated, typically around 150s/90s  3. LE edema - increasing over 2-3 weeks - occasional SOB, though fairly mild. -reports high salt intake.   4. Hypothyroidism - has appt with endocrine.    SH: works at [employer]  Past Medical History:  Diagnosis Date  . Anxiety   . Depression   . Diabetes mellitus without complication (HCC)   . GERD (gastroesophageal reflux disease)   . Hypertension   . Hypothyroidism   . Palpitations      Allergies  Allergen Reactions  . Ancef [Cefazolin] Hives  . Aspirin Other (See Comments)    GI Bleed  . Doxycycline Diarrhea and Nausea And Vomiting  . Sulfa Antibiotics Hives    Possible loss of consciousness  . Ciprofloxacin Rash    "feels like bugs are crawling in my head"  . Penicillins Rash     Current Outpatient Prescriptions  Medication Sig Dispense Refill  . allopurinol (ZYLOPRIM) 100 MG tablet   0  . ALPRAZolam (XANAX) 0.5 MG tablet Take 0.25-0.5 mg by mouth daily as needed for anxiety.     Mack Guise THYROID 15 MG tablet   0  . escitalopram (LEXAPRO) 10 MG tablet   5  . levothyroxine (SYNTHROID, LEVOTHROID) 50 MCG tablet   3  . naproxen (NAPROSYN) 500 MG tablet Take 1 tablet (500 mg total) by mouth 2 (two) times daily. (Patient not taking: Reported on  04/11/2016) 14 tablet 0  . olmesartan (BENICAR) 20 MG tablet Take 20 mg by mouth daily.     . pantoprazole (PROTONIX) 40 MG tablet Take 40 mg by mouth daily as needed (for GERD/Avid reflux).     . Vitamin D, Ergocalciferol, (DRISDOL) 50000 units CAPS capsule   0   No current facility-administered medications for this visit.      Past Surgical History:  Procedure Laterality Date  . BLADDER SURGERY     X's 3 for interstitial cystitis  . CHOLECYSTECTOMY       Allergies  Allergen Reactions  . Ancef [Cefazolin] Hives  . Aspirin Other (See Comments)    GI Bleed  . Doxycycline Diarrhea and Nausea And Vomiting  . Sulfa Antibiotics Hives    Possible loss of consciousness  . Ciprofloxacin Rash    "feels like bugs are crawling in my head"  . Penicillins Rash        Family History  Problem Relation Age of Onset  . High blood pressure Mother   . Pancreatic cancer Father     Social History Ms. Nicoletti reports that she has never smoked. She has never used smokeless tobacco. Ms. Slovacek reports that she does not drink alcohol.   Review of Systems CONSTITUTIONAL: No weight loss, fever, chills, weakness or fatigue.  HEENT:  Eyes: No visual loss, blurred vision, double vision or yellow sclerae.No hearing loss, sneezing, congestion, runny nose or sore throat.  SKIN: No rash or itching.  CARDIOVASCULAR: per hpi RESPIRATORY: No shortness of breath, cough or sputum.  GASTROINTESTINAL: No anorexia, nausea, vomiting or diarrhea. No abdominal pain or blood.  GENITOURINARY: No burning on urination, no polyuria NEUROLOGICAL: No headache, dizziness, syncope, paralysis, ataxia, numbness or tingling in the extremities. No change in bowel or bladder control.  MUSCULOSKELETAL: No muscle, back pain, joint pain or stiffness.  LYMPHATICS: No enlarged nodes. No history of splenectomy.  PSYCHIATRIC: No history of depression or anxiety.  ENDOCRINOLOGIC: No reports of sweating, cold or heat intolerance.  No polyuria or polydipsia.  Marland Kitchen   Physical Examination Vitals:   08/23/16 1141  BP: (!) 159/81  Pulse: 93   Vitals:   08/23/16 1141  Weight: 279 lb 3.2 oz (126.6 kg)  Height: 5\' 2"  (1.575 m)    Gen: resting comfortably, no acute distress HEENT: no scleral icterus, pupils equal round and reactive, no palptable cervical adenopathy,  CV: RRR, no m/r/g, no jvd Resp: Clear to auscultation bilaterally GI: abdomen is soft, non-tender, non-distended, normal bowel sounds, no hepatosplenomegaly MSK: extremities are warm, no edema.  Skin: warm, no rash Neuro:  no focal deficits Psych: appropriate affect   Diagnostic Studies  03/2010 Echo  LVEF "normal", possible diastolic dysfunction   EKG: NSR  10/2013 7 Day Event Monitor No symptoms. No arrhythmias   Assessment and Plan  1. Palpitations  - no significant arrhythmias on recent event monitor - EKG in clinic today shows NSR - no recent symptoms, continue to monitor  2. LE edema - will obtain echo, she would like to have done at [facility] where she works and will have arranged - start lasix 20mg  prn  3. HTN - elevated, follow with increased diuresis.    F/u 2 months    [facility] MD

## 2016-08-23 NOTE — Patient Instructions (Addendum)
Medication Instructions:   Begin Lasix 20mg  daily as needed for swelling.  Continue all other medications.    Labwork: none  Testing/Procedures: Your physician has requested that you have an echocardiogram. Echocardiography is a painless test that uses sound waves to create images of your heart. It provides your doctor with information about the size and shape of your heart and how well your heart's chambers and valves are working. This procedure takes approximately one hour. There are no restrictions for this procedure. - patient will have done at [facility] as she works there.   Follow-Up: 2 months   Any Other Special Instructions Will Be Listed Below (If Applicable).  If you need a refill on your cardiac medications before your next appointment, please call your pharmacy.

## 2016-09-17 ENCOUNTER — Telehealth: Payer: Self-pay | Admitting: *Deleted

## 2016-09-17 NOTE — Telephone Encounter (Signed)
-----   Message from Antoine Poche, MD sent at 09/13/2016  3:42 PM EDT ----- We received echo from [facility]. Heart pumping function is normal. There is some evidence that her heart muscle is a little stiffer than normal, this can happen with aging or HTN. This can cause some swelling and SOB, lasix is typically the treatment for it. How is her swelling doing?  Linda Ferry MD

## 2016-09-17 NOTE — Telephone Encounter (Signed)
Pt says swelling and SOB are much better - says she took lasix for 3 days as per LOV and has only taken it once since LOV. Says she has cut down on sodium and processed foods and thinks this has helped with swelling.

## 2016-10-15 ENCOUNTER — Telehealth: Payer: Self-pay | Admitting: Cardiology

## 2016-10-15 NOTE — Telephone Encounter (Signed)
Patient states she has not felt exactly right for about 7-10 days. Patient states she is having increased pain in legs at night. Patient states she spoke with PCP regarding this and he suggested she back off the fluid pill and only take it a couple of days. Patient did not take the lasix today and is having extreme swelling in feet and still very tired and give out. Patient is supposed to have blood work done tomorrow (CMP) per PCP. 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 Sherril Croon.

## 2016-10-15 NOTE — Telephone Encounter (Signed)
Mrs. Linda Richmond called stating that she is "not feeling right"  States that she feels completely wiped out. States that Her BP went up approximately 1045am 168/92.  She took an extra BP pill. No shortness of breath, no chest pains. Please call [phone] (cell)

## 2016-10-16 ENCOUNTER — Encounter: Payer: Self-pay | Admitting: Cardiology

## 2016-10-16 NOTE — Telephone Encounter (Signed)
Pt denies any weight gain, had labs drawn today and says will have results faxed to [place] tomorrow when results were available (done at [facility])

## 2016-10-16 NOTE — Telephone Encounter (Signed)
Has her weight gone up at all? I think the first step is getting back the lab results today, if her potassium or magnesium or low from the lasix that could cause these symptoms. Next steps depends on labs, please have them fax to [place] when available and send to [place] office.   Dominga Ferry MD

## 2016-10-17 NOTE — Telephone Encounter (Signed)
Magnesium still pending - says they will fax over when resulted

## 2016-10-17 NOTE — Telephone Encounter (Signed)
Do we have the labs back?   Dominga Ferry MD

## 2016-10-18 NOTE — Telephone Encounter (Signed)
Labs scanned in chart

## 2016-10-18 NOTE — Telephone Encounter (Signed)
Mrs. Ebey called the office to check on the labs that were faxed to our office.   Please call her on cell # [phone]

## 2016-10-18 NOTE — Telephone Encounter (Signed)
Pt aware that labs were received and sent to Dr Wyline Mood

## 2016-10-21 NOTE — Telephone Encounter (Signed)
Pt says she will keep regular f/u for Friday 7/20 as scheduled

## 2016-10-21 NOTE — Telephone Encounter (Signed)
Labs look ok. If she is still not feeling well I can see her Wed at 340pm to go over everything in more detail   Dominga Ferry MD

## 2016-10-25 ENCOUNTER — Encounter: Payer: Self-pay | Admitting: Cardiology

## 2016-10-25 ENCOUNTER — Ambulatory Visit (INDEPENDENT_AMBULATORY_CARE_PROVIDER_SITE_OTHER): Payer: BLUE CROSS/BLUE SHIELD | Admitting: Cardiology

## 2016-10-25 VITALS — Ht 62.0 in | Wt 267.0 lb

## 2016-10-25 DIAGNOSIS — R600 Localized edema: Secondary | ICD-10-CM

## 2016-10-25 DIAGNOSIS — R42 Dizziness and giddiness: Secondary | ICD-10-CM | POA: Diagnosis not present

## 2016-10-25 NOTE — Progress Notes (Signed)
Clinical Summary Linda Richmond is a 51 y.o.female seen today for follow up of the following medical problems. This is a focused visit on recent issues with LE edema, for more detailed history please see previous clinic notes.   1. LE edema - taking lasix 20mg  down to 3 times a week. If skips additional days can have worsening swelling.  - no significant SOB or DOE.  - leg cramping recently, bilateral. Pain can radiate down thigh.  - feels lightheaded or dizzy with standing, bending over which is new for her.  - yesterday cramping like pain across chest. Lasted about 30-45 minutes, second episode lasted about 3 hours.    2. Hypothyroidism - followed by endocrine.  - 08/2016 TSH 5.26   SH: works at [employer]   Past Medical History:  Diagnosis Date  . Anxiety   . Depression   . Diabetes mellitus without complication (HCC)   . GERD (gastroesophageal reflux disease)   . Hypertension   . Hypothyroidism   . Palpitations      Allergies  Allergen Reactions  . Ancef [Cefazolin] Hives  . Aspirin Other (See Comments)    GI Bleed  . Doxycycline Diarrhea and Nausea And Vomiting  . Sulfa Antibiotics Hives    Possible loss of consciousness  . Synthroid [Levothyroxine] Other (See Comments)    Facial flushing, numbness in lips   . Ciprofloxacin Rash    "feels like bugs are crawling in my head"  . Penicillins Rash     Current Outpatient Prescriptions  Medication Sig Dispense Refill  . ALPRAZolam (XANAX) 0.5 MG tablet Take 0.25-0.5 mg by mouth daily as needed for anxiety.     [employer] THYROID 15 MG tablet   0  . escitalopram (LEXAPRO) 10 MG tablet   5  . furosemide (LASIX) 20 MG tablet Take 1 tablet (20 mg total) by mouth daily as needed for edema (swelling). 30 tablet 6  . Lesinurad-Allopurinol 200-200 MG TABS Take by mouth.    . olmesartan (BENICAR) 20 MG tablet Take 20 mg by mouth daily.     . pantoprazole (PROTONIX) 40 MG tablet Take 40 mg by mouth daily as needed (for  GERD/Avid reflux).     . Vitamin D, Ergocalciferol, (DRISDOL) 50000 units CAPS capsule   0   No current facility-administered medications for this visit.      Past Surgical History:  Procedure Laterality Date  . BLADDER SURGERY     X's 3 for interstitial cystitis  . CHOLECYSTECTOMY       Allergies  Allergen Reactions  . Ancef [Cefazolin] Hives  . Aspirin Other (See Comments)    GI Bleed  . Doxycycline Diarrhea and Nausea And Vomiting  . Sulfa Antibiotics Hives    Possible loss of consciousness  . Synthroid [Levothyroxine] Other (See Comments)    Facial flushing, numbness in lips   . Ciprofloxacin Rash    "feels like bugs are crawling in my head"  . Penicillins Rash      Family History  Problem Relation Age of Onset  . High blood pressure Mother   . Pancreatic cancer Father      Social History Linda Richmond reports that she has never smoked. She has never used smokeless tobacco. Linda Richmond reports that she does not drink alcohol.   Review of Systems CONSTITUTIONAL: No weight loss, fever, chills, weakness or fatigue.  HEENT: Eyes: No visual loss, blurred vision, double vision or yellow sclerae.No hearing loss, sneezing, congestion, runny  nose or sore throat.  SKIN: No rash or itching.  CARDIOVASCULAR: per hpi RESPIRATORY: No shortness of breath, cough or sputum.  GASTROINTESTINAL: No anorexia, nausea, vomiting or diarrhea. No abdominal pain or blood.  GENITOURINARY: No burning on urination, no polyuria NEUROLOGICAL: No headache, dizziness, syncope, paralysis, ataxia, numbness or tingling in the extremities. No change in bowel or bladder control.  MUSCULOSKELETAL: No muscle, back pain, joint pain or stiffness.  LYMPHATICS: No enlarged nodes. No history of splenectomy.  PSYCHIATRIC: No history of depression or anxiety.  ENDOCRINOLOGIC: No reports of sweating, cold or heat intolerance. No polyuria or polydipsia.  Marland Kitchen   Physical Examination  Vitals:   10/25/16 0821   Weight: 267 lb (121.1 kg)  Height: 5\' 2"  (1.575 m)    Gen: resting comfortably, no acute distress HEENT: no scleral icterus, pupils equal round and reactive, no palptable cervical adenopathy,  CV: RRR, no m/r/g, no jvd Resp: Clear to auscultation bilaterally GI: abdomen is soft, non-tender, non-distended, normal bowel sounds, no hepatosplenomegaly MSK: extremities are warm, no edema.  Skin: warm, no rash Neuro:  no focal deficits Psych: appropriate affect   Diagnostic Studies 03/2010 Echo LVEF "normal", possible diastolic dysfunction   EKG: NSR  10/2013 7 Day Event Monitor No symptoms. No arrhythmias  09/2016 echo LVEF 55%, evidence of diastolci dysfunction  Assessment and Plan  1.LE edema - some improvement intially with lasix. Now she is having muscle cramping, found to be orthstatic in clinic. SBP down from 137 to 114 with standing, HR up 74 to 95. She is having orthostatic dizziness and muscle cramping - evidence suggests despite her LE edema she is actually intravascularly depleted. This suggests a component of her LE edema may be due to either obesity, venous insufficiency, or her ongoing hypothryoidism - given Rx for compressoin stockings, she will cut back on lasix.     F/u 3 months  10/2016, M.D.

## 2016-10-25 NOTE — Patient Instructions (Signed)
Your physician recommends that you schedule a follow-up appointment in: 3 MONTHS WITH DR [facility]  Your physician has recommended you make the following change in your medication:   WE HAVE GIVEN YOU RX FOR COMPRESSION STOCKINGS  Thank you for choosing [facility]!!

## 2017-01-30 ENCOUNTER — Ambulatory Visit: Payer: BLUE CROSS/BLUE SHIELD | Admitting: Cardiology

## 2018-03-18 ENCOUNTER — Encounter: Payer: Self-pay | Admitting: Cardiology

## 2018-04-21 ENCOUNTER — Ambulatory Visit: Payer: BLUE CROSS/BLUE SHIELD | Admitting: Cardiology

## 2018-10-23 ENCOUNTER — Other Ambulatory Visit: Payer: BLUE CROSS/BLUE SHIELD

## 2018-10-23 ENCOUNTER — Other Ambulatory Visit: Payer: Self-pay

## 2018-10-23 DIAGNOSIS — Z20822 Contact with and (suspected) exposure to covid-19: Secondary | ICD-10-CM

## 2018-10-29 LAB — NOVEL CORONAVIRUS, NAA: SARS-CoV-2, NAA: NOT DETECTED

## 2019-01-22 ENCOUNTER — Other Ambulatory Visit: Payer: Self-pay

## 2019-01-22 DIAGNOSIS — Z20822 Contact with and (suspected) exposure to covid-19: Secondary | ICD-10-CM

## 2019-01-23 LAB — NOVEL CORONAVIRUS, NAA: SARS-CoV-2, NAA: NOT DETECTED

## 2019-02-02 ENCOUNTER — Other Ambulatory Visit: Payer: Self-pay | Admitting: *Deleted

## 2019-02-02 DIAGNOSIS — Z20822 Contact with and (suspected) exposure to covid-19: Secondary | ICD-10-CM

## 2019-02-03 LAB — NOVEL CORONAVIRUS, NAA: SARS-CoV-2, NAA: NOT DETECTED

## 2019-04-13 ENCOUNTER — Ambulatory Visit: Payer: 59 | Attending: Internal Medicine

## 2019-04-13 ENCOUNTER — Other Ambulatory Visit: Payer: Self-pay

## 2019-04-13 DIAGNOSIS — Z20822 Contact with and (suspected) exposure to covid-19: Secondary | ICD-10-CM

## 2019-04-15 LAB — NOVEL CORONAVIRUS, NAA: SARS-CoV-2, NAA: NOT DETECTED

## 2019-04-19 ENCOUNTER — Ambulatory Visit (HOSPITAL_COMMUNITY)
Admission: RE | Admit: 2019-04-19 | Discharge: 2019-04-19 | Disposition: A | Discharge status: Home / Self Care | Payer: 59 | Source: Ambulatory Visit | Attending: Internal Medicine | Admitting: Internal Medicine

## 2019-04-19 ENCOUNTER — Other Ambulatory Visit (HOSPITAL_COMMUNITY): Payer: Self-pay | Admitting: Internal Medicine

## 2019-04-19 ENCOUNTER — Other Ambulatory Visit: Payer: Self-pay

## 2019-04-19 DIAGNOSIS — R062 Wheezing: Secondary | ICD-10-CM | POA: Insufficient documentation

## 2019-10-15 ENCOUNTER — Other Ambulatory Visit: Payer: Self-pay | Admitting: Nurse Practitioner

## 2019-10-15 DIAGNOSIS — R928 Other abnormal and inconclusive findings on diagnostic imaging of breast: Secondary | ICD-10-CM

## 2019-10-18 ENCOUNTER — Other Ambulatory Visit: Payer: Self-pay | Admitting: Nurse Practitioner

## 2019-10-18 DIAGNOSIS — N6320 Unspecified lump in the left breast, unspecified quadrant: Secondary | ICD-10-CM

## 2019-11-02 ENCOUNTER — Ambulatory Visit
Admission: RE | Admit: 2019-11-02 | Discharge: 2019-11-02 | Disposition: A | Discharge status: Home / Self Care | Payer: 59 | Source: Ambulatory Visit | Attending: Nurse Practitioner | Admitting: Nurse Practitioner

## 2019-11-02 ENCOUNTER — Other Ambulatory Visit: Payer: Self-pay

## 2019-11-02 ENCOUNTER — Ambulatory Visit: Payer: 59

## 2019-11-02 DIAGNOSIS — N6320 Unspecified lump in the left breast, unspecified quadrant: Secondary | ICD-10-CM

## 2019-12-01 NOTE — Progress Notes (Deleted)
Psychiatric Initial Adult Assessment   Patient Identification: Linda Richmond MRN:  [id number] Date of Evaluation:  12/01/2019 Referral Source: *** Chief Complaint:   Visit Diagnosis: No diagnosis found.  History of Present Illness:   Linda Richmond is a 54 y.o. year old female with a history of depression, anxiety, hypothyroidism, who is referred for          Associated Signs/Symptoms: Depression Symptoms:  {DEPRESSION SYMPTOMS:20000} (Hypo) Manic Symptoms:  {BHH MANIC SYMPTOMS:22872} Anxiety Symptoms:  {BHH ANXIETY SYMPTOMS:22873} Psychotic Symptoms:  {BHH PSYCHOTIC SYMPTOMS:22874} PTSD Symptoms: {BHH PTSD SYMPTOMS:22875}  Past Psychiatric History:  Outpatient:  Psychiatry admission:  Previous suicide attempt:  Past trials of medication:  History of violence:   Previous Psychotropic Medications: {YES/NO:21197}  Substance Abuse History in the last 12 months:  {yes no:314532}  Consequences of Substance Abuse: {BHH CONSEQUENCES OF SUBSTANCE ABUSE:22880}  Past Medical History:  Past Medical History:  Diagnosis Date  . Anxiety   . Depression   . Diabetes mellitus without complication (HCC)   . GERD (gastroesophageal reflux disease)   . Hypertension   . Hypothyroidism   . Palpitations     Past Surgical History:  Procedure Laterality Date  . BLADDER SURGERY     X's 3 for interstitial cystitis  . CHOLECYSTECTOMY      Family Psychiatric History: ***  Family History:  Family History  Problem Relation Age of Onset  . High blood pressure Mother   . Pancreatic cancer Father     Social History:   Social History   Socioeconomic History  . Marital status: Single    Spouse name: Not on file  . Number of children: Not on file  . Years of education: Not on file  . Highest education level: Not on file  Occupational History  . Not on file  Tobacco Use  . Smoking status: Never Smoker  . Smokeless tobacco: Never Used  Substance and Sexual Activity  . Alcohol  use: No  . Drug use: No  . Sexual activity: Not on file  Other Topics Concern  . Not on file  Social History Narrative  . Not on file   Social Determinants of Health   Financial Resource Strain:   . Difficulty of Paying Living Expenses: Not on file  Food Insecurity:   . Worried About Programme researcher, broadcasting/film/video in the Last Year: Not on file  . Ran Out of Food in the Last Year: Not on file  Transportation Needs:   . Lack of Transportation (Medical): Not on file  . Lack of Transportation (Non-Medical): Not on file  Physical Activity:   . Days of Exercise per Week: Not on file  . Minutes of Exercise per Session: Not on file  Stress:   . Feeling of Stress : Not on file  Social Connections:   . Frequency of Communication with Friends and Family: Not on file  . Frequency of Social Gatherings with Friends and Family: Not on file  . Attends Religious Services: Not on file  . Active Member of Clubs or Organizations: Not on file  . Attends Banker Meetings: Not on file  . Marital Status: Not on file    Additional Social History: ***  Allergies:   Allergies  Allergen Reactions  . Ancef [Cefazolin] Hives  . Aspirin Other (See Comments)    GI Bleed  . Doxycycline Diarrhea and Nausea And Vomiting  . Sulfa Antibiotics Hives    Possible loss of consciousness  . Synthroid [  Levothyroxine] Other (See Comments)    Facial flushing, numbness in lips   . Ciprofloxacin Rash    "feels like bugs are crawling in my head"  . Penicillins Rash    Metabolic Disorder Labs: No results found for: HGBA1C, MPG No results found for: PROLACTIN No results found for: CHOL, TRIG, HDL, CHOLHDL, VLDL, LDLCALC No results found for: TSH  Therapeutic Level Labs: No results found for: LITHIUM No results found for: CBMZ No results found for: VALPROATE  Current Medications: Current Outpatient Medications  Medication Sig Dispense Refill  . ALPRAZolam (XANAX) 0.5 MG tablet Take 0.25-0.5 mg by mouth  daily as needed for anxiety.     Marland Kitchen escitalopram (LEXAPRO) 10 MG tablet   5  . furosemide (LASIX) 20 MG tablet Take 1 tablet (20 mg total) by mouth daily as needed for edema (swelling). 30 tablet 6  . Lesinurad-Allopurinol 200-200 MG TABS Take by mouth.    . olmesartan (BENICAR) 20 MG tablet Take 20 mg by mouth 2 (two) times daily.    . pantoprazole (PROTONIX) 40 MG tablet Take 40 mg by mouth daily as needed (for GERD/Avid reflux).     Marland Kitchen thyroid (ARMOUR) 30 MG tablet Take 30 mg by mouth daily before breakfast.    . Vitamin D, Ergocalciferol, (DRISDOL) 50000 units CAPS capsule   0   No current facility-administered medications for this visit.    Musculoskeletal: Strength & Muscle Tone: N/A Gait & Station: N?A Patient leans: N/A  Psychiatric Specialty Exam: Review of Systems  Last menstrual period 04/18/2018.There is no height or weight on file to calculate BMI.  General Appearance: {Appearance:22683}  Eye Contact:  {BHH EYE CONTACT:22684}  Speech:  Clear and Coherent  Volume:  Normal  Mood:  {BHH MOOD:22306}  Affect:  {Affect (PAA):22687}  Thought Process:  Coherent  Orientation:  Full (Time, Place, and Person)  Thought Content:  Logical  Suicidal Thoughts:  {ST/HT (PAA):22692}  Homicidal Thoughts:  {ST/HT (PAA):22692}  Memory:  Immediate;   Good  Judgement:  {Judgement (PAA):22694}  Insight:  {Insight (PAA):22695}  Psychomotor Activity:  Normal  Concentration:  Concentration: Good and Attention Span: Good  Recall:  Good  Fund of Knowledge:Good  Language: Good  Akathisia:  No  Handed:  Right  AIMS (if indicated):  not done  Assets:  Communication Skills Desire for Improvement  ADL's:  Intact  Cognition: WNL  Sleep:  {BHH GOOD/FAIR/POOR:22877}   Screenings:   Assessment and Plan:    Plan  The patient demonstrates the following risk factors for suicide: Chronic risk factors for suicide include: {Chronic Risk Factors for VBTYOMA:00459977}. Acute risk factors for  suicide include: {Acute Risk Factors for SFSELTR:32023343}. Protective factors for this patient include: {Protective Factors for Suicide HWYS:16837290}. Considering these factors, the overall suicide risk at this point appears to be {Desc; low/moderate/high:110033}. Patient {ACTION; IS/IS SXJ:15520802} appropriate for outpatient follow up.     Neysa Hotter, MD 8/25/20212:59 PM

## 2019-12-05 ENCOUNTER — Encounter: Payer: Self-pay | Admitting: Internal Medicine

## 2019-12-07 ENCOUNTER — Telehealth (HOSPITAL_COMMUNITY): Payer: 59 | Admitting: Psychiatry

## 2019-12-07 ENCOUNTER — Telehealth (HOSPITAL_COMMUNITY): Payer: Self-pay | Admitting: Psychiatry

## 2019-12-07 ENCOUNTER — Other Ambulatory Visit: Payer: Self-pay

## 2019-12-07 ENCOUNTER — Ambulatory Visit (HOSPITAL_COMMUNITY): Payer: Self-pay | Admitting: Psychiatry

## 2019-12-07 NOTE — Telephone Encounter (Signed)
Sent link for video visit through Epic. Patient did not sign in. Called the patient  for appointment scheduled today. The patient did not answer the phone. Left voice message to contact the office.  

## 2019-12-10 ENCOUNTER — Emergency Department (HOSPITAL_COMMUNITY): Payer: 59

## 2019-12-10 ENCOUNTER — Encounter (HOSPITAL_COMMUNITY): Payer: Self-pay

## 2019-12-10 ENCOUNTER — Inpatient Hospital Stay (HOSPITAL_COMMUNITY)
Admission: EM | Admit: 2019-12-10 | Discharge: 2020-02-07 | DRG: 207 | Disposition: E | Payer: 59 | Attending: Pulmonary Disease | Admitting: Pulmonary Disease

## 2019-12-10 ENCOUNTER — Other Ambulatory Visit: Payer: Self-pay

## 2019-12-10 ENCOUNTER — Other Ambulatory Visit (HOSPITAL_COMMUNITY): Payer: Self-pay

## 2019-12-10 DIAGNOSIS — Z9289 Personal history of other medical treatment: Secondary | ICD-10-CM

## 2019-12-10 DIAGNOSIS — Z6841 Body Mass Index (BMI) 40.0 and over, adult: Secondary | ICD-10-CM

## 2019-12-10 DIAGNOSIS — J982 Interstitial emphysema: Secondary | ICD-10-CM | POA: Diagnosis present

## 2019-12-10 DIAGNOSIS — Z515 Encounter for palliative care: Secondary | ICD-10-CM | POA: Diagnosis not present

## 2019-12-10 DIAGNOSIS — J9621 Acute and chronic respiratory failure with hypoxia: Secondary | ICD-10-CM | POA: Diagnosis not present

## 2019-12-10 DIAGNOSIS — L8990 Pressure ulcer of unspecified site, unspecified stage: Secondary | ICD-10-CM | POA: Insufficient documentation

## 2019-12-10 DIAGNOSIS — R0902 Hypoxemia: Secondary | ICD-10-CM | POA: Diagnosis present

## 2019-12-10 DIAGNOSIS — J069 Acute upper respiratory infection, unspecified: Secondary | ICD-10-CM

## 2019-12-10 DIAGNOSIS — Z66 Do not resuscitate: Secondary | ICD-10-CM | POA: Diagnosis not present

## 2019-12-10 DIAGNOSIS — Z7189 Other specified counseling: Secondary | ICD-10-CM

## 2019-12-10 DIAGNOSIS — E039 Hypothyroidism, unspecified: Secondary | ICD-10-CM | POA: Diagnosis present

## 2019-12-10 DIAGNOSIS — Z9119 Patient's noncompliance with other medical treatment and regimen: Secondary | ICD-10-CM

## 2019-12-10 DIAGNOSIS — K089 Disorder of teeth and supporting structures, unspecified: Secondary | ICD-10-CM | POA: Diagnosis not present

## 2019-12-10 DIAGNOSIS — I480 Paroxysmal atrial fibrillation: Secondary | ICD-10-CM | POA: Diagnosis not present

## 2019-12-10 DIAGNOSIS — I4891 Unspecified atrial fibrillation: Secondary | ICD-10-CM | POA: Diagnosis not present

## 2019-12-10 DIAGNOSIS — R040 Epistaxis: Secondary | ICD-10-CM | POA: Diagnosis not present

## 2019-12-10 DIAGNOSIS — E872 Acidosis: Secondary | ICD-10-CM | POA: Diagnosis present

## 2019-12-10 DIAGNOSIS — J9622 Acute and chronic respiratory failure with hypercapnia: Secondary | ICD-10-CM | POA: Diagnosis not present

## 2019-12-10 DIAGNOSIS — F32A Depression, unspecified: Secondary | ICD-10-CM | POA: Diagnosis present

## 2019-12-10 DIAGNOSIS — T380X5A Adverse effect of glucocorticoids and synthetic analogues, initial encounter: Secondary | ICD-10-CM | POA: Diagnosis present

## 2019-12-10 DIAGNOSIS — I10 Essential (primary) hypertension: Secondary | ICD-10-CM | POA: Diagnosis present

## 2019-12-10 DIAGNOSIS — M0600 Rheumatoid arthritis without rheumatoid factor, unspecified site: Secondary | ICD-10-CM | POA: Diagnosis present

## 2019-12-10 DIAGNOSIS — K219 Gastro-esophageal reflux disease without esophagitis: Secondary | ICD-10-CM | POA: Diagnosis present

## 2019-12-10 DIAGNOSIS — Z79899 Other long term (current) drug therapy: Secondary | ICD-10-CM | POA: Diagnosis not present

## 2019-12-10 DIAGNOSIS — J9690 Respiratory failure, unspecified, unspecified whether with hypoxia or hypercapnia: Secondary | ICD-10-CM

## 2019-12-10 DIAGNOSIS — M069 Rheumatoid arthritis, unspecified: Secondary | ICD-10-CM | POA: Diagnosis not present

## 2019-12-10 DIAGNOSIS — J80 Acute respiratory distress syndrome: Secondary | ICD-10-CM | POA: Diagnosis present

## 2019-12-10 DIAGNOSIS — N179 Acute kidney failure, unspecified: Secondary | ICD-10-CM | POA: Diagnosis not present

## 2019-12-10 DIAGNOSIS — G4733 Obstructive sleep apnea (adult) (pediatric): Secondary | ICD-10-CM | POA: Diagnosis present

## 2019-12-10 DIAGNOSIS — J9620 Acute and chronic respiratory failure, unspecified whether with hypoxia or hypercapnia: Secondary | ICD-10-CM | POA: Diagnosis not present

## 2019-12-10 DIAGNOSIS — G9349 Other encephalopathy: Secondary | ICD-10-CM | POA: Diagnosis not present

## 2019-12-10 DIAGNOSIS — D539 Nutritional anemia, unspecified: Secondary | ICD-10-CM | POA: Diagnosis not present

## 2019-12-10 DIAGNOSIS — U071 COVID-19: Secondary | ICD-10-CM | POA: Diagnosis present

## 2019-12-10 DIAGNOSIS — F419 Anxiety disorder, unspecified: Secondary | ICD-10-CM | POA: Diagnosis present

## 2019-12-10 DIAGNOSIS — Z978 Presence of other specified devices: Secondary | ICD-10-CM

## 2019-12-10 DIAGNOSIS — E1165 Type 2 diabetes mellitus with hyperglycemia: Secondary | ICD-10-CM | POA: Diagnosis present

## 2019-12-10 DIAGNOSIS — J9383 Other pneumothorax: Secondary | ICD-10-CM | POA: Diagnosis not present

## 2019-12-10 DIAGNOSIS — R7989 Other specified abnormal findings of blood chemistry: Secondary | ICD-10-CM

## 2019-12-10 DIAGNOSIS — J1282 Pneumonia due to coronavirus disease 2019: Secondary | ICD-10-CM | POA: Diagnosis present

## 2019-12-10 DIAGNOSIS — M2630 Unspecified anomaly of tooth position of fully erupted tooth or teeth: Secondary | ICD-10-CM | POA: Diagnosis not present

## 2019-12-10 DIAGNOSIS — J939 Pneumothorax, unspecified: Secondary | ICD-10-CM

## 2019-12-10 DIAGNOSIS — E11649 Type 2 diabetes mellitus with hypoglycemia without coma: Secondary | ICD-10-CM | POA: Diagnosis not present

## 2019-12-10 DIAGNOSIS — R000 Tachycardia, unspecified: Secondary | ICD-10-CM | POA: Diagnosis not present

## 2019-12-10 DIAGNOSIS — E870 Hyperosmolality and hypernatremia: Secondary | ICD-10-CM | POA: Diagnosis not present

## 2019-12-10 DIAGNOSIS — Z4659 Encounter for fitting and adjustment of other gastrointestinal appliance and device: Secondary | ICD-10-CM

## 2019-12-10 DIAGNOSIS — Z452 Encounter for adjustment and management of vascular access device: Secondary | ICD-10-CM

## 2019-12-10 DIAGNOSIS — E119 Type 2 diabetes mellitus without complications: Secondary | ICD-10-CM

## 2019-12-10 DIAGNOSIS — J690 Pneumonitis due to inhalation of food and vomit: Secondary | ICD-10-CM | POA: Diagnosis not present

## 2019-12-10 DIAGNOSIS — D72819 Decreased white blood cell count, unspecified: Secondary | ICD-10-CM | POA: Diagnosis present

## 2019-12-10 DIAGNOSIS — R0602 Shortness of breath: Secondary | ICD-10-CM

## 2019-12-10 DIAGNOSIS — Z0184 Encounter for antibody response examination: Secondary | ICD-10-CM

## 2019-12-10 DIAGNOSIS — Z9049 Acquired absence of other specified parts of digestive tract: Secondary | ICD-10-CM

## 2019-12-10 DIAGNOSIS — Z79890 Hormone replacement therapy (postmenopausal): Secondary | ICD-10-CM

## 2019-12-10 DIAGNOSIS — J9601 Acute respiratory failure with hypoxia: Secondary | ICD-10-CM | POA: Diagnosis not present

## 2019-12-10 HISTORY — DX: Rheumatoid arthritis, unspecified: M06.9

## 2019-12-10 LAB — CBC WITH DIFFERENTIAL/PLATELET
Abs Immature Granulocytes: 0.02 10*3/uL (ref 0.00–0.07)
Basophils Absolute: 0 10*3/uL (ref 0.0–0.1)
Basophils Relative: 0 %
Eosinophils Absolute: 0 10*3/uL (ref 0.0–0.5)
Eosinophils Relative: 0 %
HCT: 48.1 % — ABNORMAL HIGH (ref 36.0–46.0)
Hemoglobin: 15.7 g/dL — ABNORMAL HIGH (ref 12.0–15.0)
Immature Granulocytes: 1 %
Lymphocytes Relative: 32 %
Lymphs Abs: 0.9 10*3/uL (ref 0.7–4.0)
MCH: 30.1 pg (ref 26.0–34.0)
MCHC: 32.6 g/dL (ref 30.0–36.0)
MCV: 92.3 fL (ref 80.0–100.0)
Monocytes Absolute: 0.2 10*3/uL (ref 0.1–1.0)
Monocytes Relative: 7 %
Neutro Abs: 1.7 10*3/uL (ref 1.7–7.7)
Neutrophils Relative %: 60 %
Platelets: 179 10*3/uL (ref 150–400)
RBC: 5.21 MIL/uL — ABNORMAL HIGH (ref 3.87–5.11)
RDW: 13.4 % (ref 11.5–15.5)
WBC: 2.8 10*3/uL — ABNORMAL LOW (ref 4.0–10.5)
nRBC: 0 % (ref 0.0–0.2)

## 2019-12-10 LAB — HEPATIC FUNCTION PANEL
ALT: 32 U/L (ref 0–44)
AST: 44 U/L — ABNORMAL HIGH (ref 15–41)
Albumin: 3.4 g/dL — ABNORMAL LOW (ref 3.5–5.0)
Alkaline Phosphatase: 53 U/L (ref 38–126)
Bilirubin, Direct: 0.1 mg/dL (ref 0.0–0.2)
Indirect Bilirubin: 0.5 mg/dL (ref 0.3–0.9)
Total Bilirubin: 0.6 mg/dL (ref 0.3–1.2)
Total Protein: 7.4 g/dL (ref 6.5–8.1)

## 2019-12-10 LAB — BASIC METABOLIC PANEL
Anion gap: 13 (ref 5–15)
BUN: 10 mg/dL (ref 6–20)
CO2: 22 mmol/L (ref 22–32)
Calcium: 8.1 mg/dL — ABNORMAL LOW (ref 8.9–10.3)
Chloride: 102 mmol/L (ref 98–111)
Creatinine, Ser: 0.73 mg/dL (ref 0.44–1.00)
GFR calc Af Amer: >60 mL/min (ref >60)
GFR calc non Af Amer: >60 mL/min (ref >60)
Glucose, Bld: 310 mg/dL — ABNORMAL HIGH (ref 70–99)
Potassium: 4 mmol/L (ref 3.5–5.1)
Sodium: 137 mmol/L (ref 135–145)

## 2019-12-10 LAB — PROCALCITONIN: Procalcitonin: <0.1 ng/mL

## 2019-12-10 LAB — CBG MONITORING, ED
Glucose-Capillary: 299 mg/dL — ABNORMAL HIGH (ref 70–99)
Glucose-Capillary: 217 mg/dL — ABNORMAL HIGH (ref 70–99)
Glucose-Capillary: 367 mg/dL — ABNORMAL HIGH (ref 70–99)

## 2019-12-10 LAB — D-DIMER, QUANTITATIVE: D-Dimer, Quant: 0.46 ug/mL-FEU (ref 0.00–0.50)

## 2019-12-10 LAB — SARS CORONAVIRUS 2 BY RT PCR (HOSPITAL ORDER, PERFORMED IN ~~LOC~~ HOSPITAL LAB): SARS Coronavirus 2: POSITIVE — AB

## 2019-12-10 LAB — C-REACTIVE PROTEIN: CRP: 12.1 mg/dL — ABNORMAL HIGH

## 2019-12-10 LAB — LACTIC ACID, PLASMA: Lactic Acid, Venous: 1.6 mmol/L (ref 0.5–1.9)

## 2019-12-10 LAB — FERRITIN: Ferritin: 487 ng/mL — ABNORMAL HIGH (ref 11–307)

## 2019-12-10 LAB — HIV ANTIBODY (ROUTINE TESTING W REFLEX): HIV Screen 4th Generation wRfx: NONREACTIVE

## 2019-12-10 LAB — TRIGLYCERIDES: Triglycerides: 152 mg/dL — ABNORMAL HIGH

## 2019-12-10 LAB — HEMOGLOBIN A1C
Hgb A1c MFr Bld: 10.8 % — ABNORMAL HIGH (ref 4.8–5.6)
Mean Plasma Glucose: 263.26 mg/dL

## 2019-12-10 LAB — LACTATE DEHYDROGENASE: LDH: 319 U/L — ABNORMAL HIGH (ref 98–192)

## 2019-12-10 LAB — FIBRINOGEN: Fibrinogen: 584 mg/dL — ABNORMAL HIGH (ref 210–475)

## 2019-12-10 MED ORDER — ALPRAZOLAM 0.5 MG PO TABS
0.2500 mg | ORAL_TABLET | Freq: Every day | ORAL | Status: DC | PRN
  Administered 2019-12-10: 0.5 mg via ORAL
  Filled 2019-12-10: qty 1

## 2019-12-10 MED ORDER — SODIUM CHLORIDE 0.9 % IV BOLUS
1000.0000 mL | Freq: Once | INTRAVENOUS | Status: AC
  Administered 2019-12-10: 1000 mL via INTRAVENOUS

## 2019-12-10 MED ORDER — SODIUM CHLORIDE 0.9 % IV SOLN
100.0000 mg | INTRAVENOUS | Status: AC
  Administered 2019-12-10 (×2): 100 mg via INTRAVENOUS
  Filled 2019-12-10 (×2): qty 20

## 2019-12-10 MED ORDER — INSULIN ASPART 100 UNIT/ML ~~LOC~~ SOLN
0.0000 [IU] | Freq: Three times a day (TID) | SUBCUTANEOUS | Status: DC
  Administered 2019-12-10: 5 [IU] via SUBCUTANEOUS
  Administered 2019-12-11: 8 [IU] via SUBCUTANEOUS
  Filled 2019-12-10 (×2): qty 1

## 2019-12-10 MED ORDER — ESCITALOPRAM OXALATE 10 MG PO TABS
10.0000 mg | ORAL_TABLET | Freq: Every day | ORAL | Status: DC
  Filled 2019-12-10 (×2): qty 1

## 2019-12-10 MED ORDER — HYDROCOD POLST-CPM POLST ER 10-8 MG/5ML PO SUER
5.0000 mL | Freq: Two times a day (BID) | ORAL | Status: DC | PRN
  Administered 2019-12-10 – 2019-12-20 (×2): 5 mL via ORAL
  Filled 2019-12-10 (×2): qty 5

## 2019-12-10 MED ORDER — VITAMIN D (ERGOCALCIFEROL) 1.25 MG (50000 UNIT) PO CAPS
50000.0000 [IU] | ORAL_CAPSULE | ORAL | Status: DC
  Administered 2019-12-11 – 2020-01-08 (×4): 50000 [IU] via ORAL
  Filled 2019-12-10 (×8): qty 1

## 2019-12-10 MED ORDER — ACETAMINOPHEN 325 MG PO TABS
650.0000 mg | ORAL_TABLET | Freq: Four times a day (QID) | ORAL | Status: DC | PRN
  Administered 2019-12-21 – 2019-12-23 (×2): 650 mg via ORAL
  Filled 2019-12-10 (×2): qty 2

## 2019-12-10 MED ORDER — THYROID 30 MG PO TABS
30.0000 mg | ORAL_TABLET | Freq: Every day | ORAL | Status: DC
  Administered 2019-12-21 – 2020-01-02 (×11): 30 mg via ORAL
  Filled 2019-12-10 (×24): qty 1

## 2019-12-10 MED ORDER — ALBUTEROL SULFATE HFA 108 (90 BASE) MCG/ACT IN AERS
2.0000 | INHALATION_SPRAY | Freq: Once | RESPIRATORY_TRACT | Status: AC
  Administered 2019-12-10: 2 via RESPIRATORY_TRACT
  Filled 2019-12-10: qty 6.7

## 2019-12-10 MED ORDER — SODIUM CHLORIDE 0.9 % IV SOLN: 100.0000 mg | Freq: Every day | INTRAVENOUS | Status: DC

## 2019-12-10 MED ORDER — GUAIFENESIN-DM 100-10 MG/5ML PO SYRP
10.0000 mL | ORAL_SOLUTION | ORAL | Status: DC | PRN
  Administered 2019-12-21 – 2019-12-23 (×2): 10 mL via ORAL
  Filled 2019-12-10 (×4): qty 10

## 2019-12-10 MED ORDER — IPRATROPIUM-ALBUTEROL 20-100 MCG/ACT IN AERS
1.0000 | INHALATION_SPRAY | Freq: Four times a day (QID) | RESPIRATORY_TRACT | Status: DC
  Administered 2019-12-10 – 2019-12-29 (×76): 1 via RESPIRATORY_TRACT
  Filled 2019-12-10: qty 4

## 2019-12-10 MED ORDER — INSULIN DETEMIR 100 UNIT/ML ~~LOC~~ SOLN
5.0000 [IU] | Freq: Every day | SUBCUTANEOUS | Status: DC
  Administered 2019-12-10: 5 [IU] via SUBCUTANEOUS
  Filled 2019-12-10 (×4): qty 0.05

## 2019-12-10 MED ORDER — SODIUM CHLORIDE 0.9 % IV SOLN: 200.0000 mg | Freq: Once | INTRAVENOUS | Status: DC

## 2019-12-10 MED ORDER — ENOXAPARIN SODIUM 40 MG/0.4ML ~~LOC~~ SOLN
40.0000 mg | SUBCUTANEOUS | Status: DC
  Administered 2019-12-10: 40 mg via SUBCUTANEOUS
  Filled 2019-12-10: qty 0.4

## 2019-12-10 MED ORDER — LINAGLIPTIN 5 MG PO TABS
5.0000 mg | ORAL_TABLET | Freq: Every day | ORAL | Status: DC
  Administered 2019-12-10 – 2019-12-31 (×22): 5 mg via ORAL
  Filled 2019-12-10 (×26): qty 1

## 2019-12-10 MED ORDER — DEXAMETHASONE SODIUM PHOSPHATE 10 MG/ML IJ SOLN
10.0000 mg | INTRAMUSCULAR | Status: DC
  Administered 2019-12-10: 10 mg via INTRAVENOUS
  Filled 2019-12-10: qty 1

## 2019-12-10 MED ORDER — INSULIN ASPART 100 UNIT/ML ~~LOC~~ SOLN
0.0000 [IU] | Freq: Every day | SUBCUTANEOUS | Status: DC
  Administered 2019-12-10: 5 [IU] via SUBCUTANEOUS
  Filled 2019-12-10: qty 1

## 2019-12-10 MED ORDER — SODIUM CHLORIDE 0.9 % IV SOLN
100.0000 mg | Freq: Every day | INTRAVENOUS | Status: AC
  Administered 2019-12-11 – 2019-12-14 (×4): 100 mg via INTRAVENOUS
  Filled 2019-12-10 (×4): qty 20

## 2019-12-10 MED ORDER — IRBESARTAN 75 MG PO TABS
37.5000 mg | ORAL_TABLET | Freq: Every day | ORAL | Status: DC
  Administered 2019-12-10 – 2019-12-15 (×6): 37.5 mg via ORAL
  Filled 2019-12-10 (×5): qty 0.5
  Filled 2019-12-10: qty 1
  Filled 2019-12-10 (×2): qty 0.5
  Filled 2019-12-10: qty 1

## 2019-12-10 MED ORDER — PANTOPRAZOLE SODIUM 40 MG PO TBEC
40.0000 mg | DELAYED_RELEASE_TABLET | Freq: Every day | ORAL | Status: DC | PRN
  Administered 2019-12-10 – 2019-12-25 (×2): 40 mg via ORAL
  Filled 2019-12-10 (×2): qty 1

## 2019-12-10 NOTE — ED Notes (Signed)
Walked pt without o2, pt desat to 83% on RA after ambulation. 3L  given to pt to return sats to normal. Pt now sitting in bed at rest on room air at 94%. PA made aware.

## 2019-12-10 NOTE — ED Triage Notes (Signed)
Pt to er, pt states that she was tested a week ago for covid, states that she is covid positive, pt states that she is here for cough, shortness of breath and fatigue.  States that she is not vaccinated.

## 2019-12-10 NOTE — ED Provider Notes (Signed)
[facility] EMERGENCY DEPARTMENT Provider Note   CSN: [id number] Arrival date & time: 12/18/2019  1239     History Chief Complaint  Patient presents with  . Shortness of Breath    Linda Richmond is a 54 y.o. female with PMHx anxiety, depression, diabetes, GERD, HTN, and RA who presents to the ED today with complaints of gradual onset, constant, worsening, shortness of breath that began yesterday. Pt reports that 1 week ago she began having body aches and a headaches. She went to get tested for COVID 19 on 08/27 and received POSITIVE results on 08/29. She states that she was prescribed a Zpack and Prednisone; both which she has finished. Pt states that she was doing okay at home until yesterday and felt so short of breath today she wanted to come to the ED for further evaluation. She states her son is currently admitted upstairs for COVID. Pt is unvaccinated. She reports she has a pulse ox at home and she is ranging around 92% on RA however she noticed today when she would get up to use the restroom or move around her house the pulse ox would drop to 86%. She states once she sat down it would go back up to 90% however would take some time getting back up above 92% which concerned her. Pt has also been having diarrhea however she states this did not start until after starting the Zpack. She reports she has not been eating or drinking much over the past 3 days due to a bad taste in her mouth. She has also not been taking any of her medications.   The history is provided by the patient and medical records.       Past Medical History:  Diagnosis Date  . Anxiety   . Depression   . Diabetes mellitus without complication (HCC)   . GERD (gastroesophageal reflux disease)   . Hypertension   . Hypothyroidism   . Palpitations   . Rheumatoid arthritis [facility])     Patient Active Problem List   Diagnosis Date Noted  . Neck pain 04/11/2016  . Right shoulder pain 04/11/2016  . Palpitations 09/30/2013     Past Surgical History:  Procedure Laterality Date  . BLADDER SURGERY     X's 3 for interstitial cystitis  . CHOLECYSTECTOMY       OB History   No obstetric history on file.     Family History  Problem Relation Age of Onset  . High blood pressure Mother   . Pancreatic cancer Father     Social History   Tobacco Use  . Smoking status: Never Smoker  . Smokeless tobacco: Never Used  Substance Use Topics  . Alcohol use: No  . Drug use: No    Home Medications Prior to Admission medications   Medication Sig Start Date End Date Taking? Authorizing Provider  ALPRAZolam Prudy Feeler) 0.5 MG tablet Take 0.25-0.5 mg by mouth daily as needed for anxiety.     [provider]  escitalopram (LEXAPRO) 10 MG tablet  07/02/16   [provider]  furosemide (LASIX) 20 MG tablet Take 1 tablet (20 mg total) by mouth daily as needed for edema (swelling). 08/23/16 11/21/16  Antoine Poche, MD  Lesinurad-Allopurinol 200-200 MG TABS Take by mouth.    [provider]  olmesartan (BENICAR) 20 MG tablet Take 20 mg by mouth 2 (two) times daily.    [provider]  pantoprazole (PROTONIX) 40 MG tablet Take 40 mg by mouth  daily as needed (for GERD/Avid reflux).     [provider]  thyroid (ARMOUR) 30 MG tablet Take 30 mg by mouth daily before breakfast.    [provider]  Vitamin D, Ergocalciferol, (DRISDOL) 50000 units CAPS capsule  07/17/16   [provider]    Allergies    Ancef [cefazolin], Aspirin, Doxycycline, Sulfa antibiotics, Synthroid [levothyroxine], Ciprofloxacin, and Penicillins  Review of Systems   Review of Systems  Constitutional: Positive for chills and fatigue.  Respiratory: Positive for cough, chest tightness and shortness of breath.   Gastrointestinal: Positive for diarrhea and nausea. Negative for vomiting.  Musculoskeletal: Positive for myalgias.  All other systems reviewed and are negative.   Physical  Exam Updated Vital Signs BP (!) 146/91   Pulse 91   Temp 99.4 F (37.4 C) (Oral)   Resp 13   Ht 5\' 2"  (1.575 m)   Wt 118.8 kg   LMP 04/18/2018   SpO2 91%   BMI 47.92 kg/m   Physical Exam Vitals and nursing note reviewed.  Constitutional:      Appearance: She is obese. She is not ill-appearing or diaphoretic.  HENT:     Head: Normocephalic and atraumatic.  Eyes:     Conjunctiva/sclera: Conjunctivae normal.  Cardiovascular:     Rate and Rhythm: Normal rate and regular rhythm.     Pulses: Normal pulses.  Pulmonary:     Effort: Pulmonary effort is normal.     Breath sounds: Decreased breath sounds present. No wheezing, rhonchi or rales.  Abdominal:     Palpations: Abdomen is soft.     Tenderness: There is no abdominal tenderness. There is no guarding or rebound.  Musculoskeletal:     Cervical back: Neck supple.     Right lower leg: No edema.     Left lower leg: No edema.  Skin:    General: Skin is warm and dry.  Neurological:     Mental Status: She is alert.     ED Results / Procedures / Treatments   Labs (all labs ordered are listed, but only abnormal results are displayed) Labs Reviewed  BASIC METABOLIC PANEL - Abnormal; Notable for the following components:      Result Value   Glucose, Bld 310 (*)    Calcium 8.1 (*)    All other components within normal limits  CBC WITH DIFFERENTIAL/PLATELET - Abnormal; Notable for the following components:   WBC 2.8 (*)    RBC 5.21 (*)    Hemoglobin 15.7 (*)    HCT 48.1 (*)    All other components within normal limits  CBG MONITORING, ED - Abnormal; Notable for the following components:   Glucose-Capillary 299 (*)    All other components within normal limits  CULTURE, BLOOD (ROUTINE X 2)  CULTURE, BLOOD (ROUTINE X 2)  LACTIC ACID, PLASMA  LACTIC ACID, PLASMA  D-DIMER, QUANTITATIVE (NOT AT [facility])  PROCALCITONIN  LACTATE DEHYDROGENASE  FERRITIN  TRIGLYCERIDES  FIBRINOGEN  C-REACTIVE PROTEIN     EKG None  Radiology DG Chest Port 1 View  Result Date: 12/12/2019 CLINICAL DATA:  COVID positive EXAM: PORTABLE CHEST 1 VIEW COMPARISON:  04/19/2019 chest radiograph and prior. FINDINGS: Hypoinflated lungs. Bilateral mid to lower lung patchy opacities, new since prior exam. No pneumothorax. Trace bilateral effusions. Cardiomediastinal silhouette within normal limits. IMPRESSION: New bilateral mid to lower lung patchy opacities. Hypoinflated lungs. Electronically Signed   By: 06/17/2019 M.D.   On: 12/26/2019 13:57    Procedures Procedures (including  critical care time)  Medications Ordered in ED Medications  sodium chloride 0.9 % bolus 1,000 mL (0 mLs Intravenous Stopped 12/29/2019 1503)  albuterol (VENTOLIN HFA) 108 (90 Base) MCG/ACT inhaler 2 puff (2 puffs Inhalation Given 12/25/2019 1346)    ED Course  I have reviewed the triage vital signs and the nursing notes.  Pertinent labs & imaging results that were available during my care of the patient were reviewed by me and considered in my medical decision making (see chart for details).    MDM Rules/Calculators/A&P                          KAMSIYOCHUKWU FRIDDLE was evaluated in Emergency Department on 12/29/2019 for the symptoms described in the history of present illness. She was evaluated in the context of the global COVID-19 pandemic, which necessitated consideration that the patient might be at risk for infection with the SARS-CoV-2 virus that causes COVID-19. Institutional protocols and algorithms that pertain to the evaluation of patients at risk for COVID-19 are in a state of rapid change based on information released by regulatory bodies including the CDC and federal and state organizations. These policies and algorithms were followed during the patient's care in the ED.  54 year old female who is Covid positive (08/27) presenting to the ED today with worsening shortness of breath that started yesterday.  Patient was able to drive  herself to the ED however on arrival she was noted to be hypoxic at 89% on room air.  Patient was placed on 6 L with increased to 96%.  She is not on oxygen at home.  While in the room patient initially weaned down to 4 L with stable O2 sats at 95%, wean down to 2 L and then essentially taken off of her oxygen.  O2 sats remained at 93%.  Patient states she feels too weak to ambulate currently however we will plan to ambulate with pulse ox.  She states she has a pulse ox at home and has been checking it and she drops down to 86% with ambulation.  Patient is otherwise well-appearing.  She does appear anxious today however she is able to speak in full sentences without difficulty and make jokes with staff.  We will plan to obtain lab work given she has not been eating or drinking much, provide fluids, obtain EKG, x-ray.  Will plan for albuterol inhaler and reassessment.   EKG NSR; no acute ischemic changes CXR with bilateral infiltrates consistent with COVID diagnosis CBC with leukopenia 2.8, Hgb and HCT 15.7/48.1. Appears slightly hemoconcentrated; pt receiving fluids BMP with glucose 310. Bicarb 22, no gap. Creatinine 0.73.   Nursing staff attempted to ambulate patient. With ambulation pt desatted to 83%. Placed on 3L [place] to return sats to normal; pt now sitting in bed at 94% on RA. Pt will need to be admitted at this time for oxygen requirement.   Discussed case with Dr. Randol Kern [facility] who agrees to evaluate patient for admission.   This note was prepared using Dragon voice recognition software and may include unintentional dictation errors due to the inherent limitations of voice recognition software.  Final Clinical Impression(s) / ED Diagnoses Final diagnoses:  Pneumonia due to COVID-19 virus  Hypoxia    Rx / DC Orders ED Discharge Orders    None       Tanda Rockers, PA-C 12/16/2019 1525    Bethann Berkshire, MD 12/14/19 [phone]

## 2019-12-10 NOTE — ED Triage Notes (Signed)
Pt placed on 6L O2 via [place] pt now satting low to mid 90s, pt satting 95% on 6L O2 via [place]

## 2019-12-10 NOTE — H&P (Addendum)
TRH H&P   Patient Demographics:    Linda Richmond, is a 54 y.o. female  MRN: [id number]   DOB - [birth year]  Admit Date - 12/11/2019  Outpatient Primary MD for the patient is Ignatius Specking, MD  Referring MD/NP/PA: [facility]  Patient coming from: home  Chief Complaint  Patient presents with  . Shortness of Breath      HPI:    Linda Richmond  is a 54 y.o. female, with past medical history of anxiety, depression, diabetes, GERD, hypertension, vitamin D deficiency, negative rheumatoid arthritis, she presents to ED secondary to shortness of breath, reported began yesterday, patient reports for last week she started to have Covid symptoms including generalized body ache, headache, runny nose, nasal congestion, she was tested positive for COVID-19 on 8/27, prescribed Z-Pak and prednisone, she has finished both without much improvement, reports she started to have dyspnea yesterday, her son is admitted to [facility] due to COVID-19 as well, patient is unvaccinated, patient denies any chest pain, hemoptysis. -In ED patient saturating 86% on room air at rest, 83% on room air with activity, chest x-ray significant for multifocal opacities due to COVID-19 pneumonia, glucose was elevated at 310, triage hospitalist consulted to admit.    Review of systems:    In addition to the HPI above,  She does report fever and chills, fatigue, No Headache, No changes with Vision or hearing, No problems swallowing food or Liquids, Reports cough and shortness of breath No Abdominal pain, reports nausea and diarrhea, no vomiting No Blood in stool or Urine, No dysuria, No new skin rashes or bruises, Does report generalized body ache No new weakness, tingling, numbness in any extremity, No recent weight gain or loss, No polyuria, polydypsia or polyphagia, No significant Mental Stressors.  A full 10  point Review of Systems was done, except as stated above, all other Review of Systems were negative.   With Past History of the following :    Past Medical History:  Diagnosis Date  . Anxiety   . Depression   . Diabetes mellitus without complication (HCC)   . GERD (gastroesophageal reflux disease)   . Hypertension   . Hypothyroidism   . Palpitations   . Rheumatoid arthritis [facility])       Past Surgical History:  Procedure Laterality Date  . BLADDER SURGERY     X's 3 for interstitial cystitis  . CHOLECYSTECTOMY        Social History:     Social History   Tobacco Use  . Smoking status: Never Smoker  . Smokeless tobacco: Never Used  Substance Use Topics  . Alcohol use: No       Family History :     Family History  Problem Relation Age of Onset  . High blood pressure Mother   . Pancreatic cancer Father       Home Medications:  Prior to Admission medications   Medication Sig Start Date End Date Taking? Authorizing Provider  ALPRAZolam Prudy Feeler) 0.5 MG tablet Take 0.25-0.5 mg by mouth daily as needed for anxiety.     [provider]  escitalopram (LEXAPRO) 10 MG tablet  07/02/16   [provider]  furosemide (LASIX) 20 MG tablet Take 1 tablet (20 mg total) by mouth daily as needed for edema (swelling). 08/23/16 11/21/16  Antoine Poche, MD  Lesinurad-Allopurinol 200-200 MG TABS Take by mouth.    [provider]  olmesartan (BENICAR) 20 MG tablet Take 20 mg by mouth 2 (two) times daily.    [provider]  pantoprazole (PROTONIX) 40 MG tablet Take 40 mg by mouth daily as needed (for GERD/Avid reflux).     [provider]  thyroid (ARMOUR) 30 MG tablet Take 30 mg by mouth daily before breakfast.    [provider]  Vitamin D, Ergocalciferol, (DRISDOL) 50000 units CAPS capsule  07/17/16   [provider]     Allergies:     Allergies  Allergen Reactions  . Ancef [Cefazolin] Hives  . Aspirin Other  (See Comments)    GI Bleed  . Doxycycline Diarrhea and Nausea And Vomiting  . Sulfa Antibiotics Hives    Possible loss of consciousness  . Synthroid [Levothyroxine] Other (See Comments)    Facial flushing, numbness in lips   . Ciprofloxacin Rash    "feels like bugs are crawling in my head"  . Penicillins Rash     Physical Exam:   Vitals  Blood pressure 137/79, pulse 82, temperature 99.4 F (37.4 C), temperature source Oral, resp. rate 19, height 5\' 2"  (1.575 m), weight 118.8 kg, last menstrual period 04/18/2018, SpO2 93 %.   1. General lying in bed in NAD,   2. Normal affect and insight, Not Suicidal or Homicidal, Awake Alert, Oriented X 3.  3. No F.N deficits, ALL C.Nerves Intact, Strength 5/5 all 4 extremities, Sensation intact all 4 extremities, Plantars down going.  4. Ears and Eyes appear Normal, Conjunctivae clear, PERRLA. Moist Oral Mucosa.  5. Supple Neck, No JVD, No cervical lymphadenopathy appriciated, No Carotid Bruits.  6. Symmetrical Chest wall movement, Good air movement bilaterally, CTAB.  7. RRR, No Gallops, Rubs or Murmurs, No Parasternal Heave.  8. Positive Bowel Sounds, Abdomen Soft, No tenderness, No organomegaly appriciated,No rebound -guarding or rigidity.  9.  No Cyanosis, Normal Skin Turgor, No Skin Rash or Bruise.  10. Good muscle tone,  joints appear normal , no effusions, Normal ROM.  11. No Palpable Lymph Nodes in Neck or Axillae    Data Review:    CBC Recent Labs  Lab 01/04/2020 1330  WBC 2.8*  HGB 15.7*  HCT 48.1*  PLT 179  MCV 92.3  MCH 30.1  MCHC 32.6  RDW 13.4  LYMPHSABS 0.9  MONOABS 0.2  EOSABS 0.0  BASOSABS 0.0   ------------------------------------------------------------------------------------------------------------------  Chemistries  Recent Labs  Lab 12/26/2019 1330  NA 137  K 4.0  CL 102  CO2 22  GLUCOSE 310*  BUN 10  CREATININE 0.73  CALCIUM 8.1*    ------------------------------------------------------------------------------------------------------------------ estimated creatinine clearance is 96.2 mL/min (by C-G formula based on SCr of 0.73 mg/dL). ------------------------------------------------------------------------------------------------------------------ No results for input(s): TSH, T4TOTAL, T3FREE, THYROIDAB in the last 72 hours.  Invalid input(s): FREET3  Coagulation profile No results for input(s): INR, PROTIME in the last 168 hours. ------------------------------------------------------------------------------------------------------------------- No results for input(s): DDIMER in the last 72 hours. -------------------------------------------------------------------------------------------------------------------  Cardiac Enzymes No  results for input(s): CKMB, TROPONINI, MYOGLOBIN in the last 168 hours.  Invalid input(s): CK ------------------------------------------------------------------------------------------------------------------ No results found for: BNP   ---------------------------------------------------------------------------------------------------------------  Urinalysis No results found for: COLORURINE, APPEARANCEUR, LABSPEC, PHURINE, GLUCOSEU, HGBUR, BILIRUBINUR, KETONESUR, PROTEINUR, UROBILINOGEN, NITRITE, LEUKOCYTESUR  ----------------------------------------------------------------------------------------------------------------      Imaging Results:    DG Chest Port 1 View  Result Date: 06-Jan-2020 CLINICAL DATA:  COVID positive EXAM: PORTABLE CHEST 1 VIEW COMPARISON:  04/19/2019 chest radiograph and prior. FINDINGS: Hypoinflated lungs. Bilateral mid to lower lung patchy opacities, new since prior exam. No pneumothorax. Trace bilateral effusions. Cardiomediastinal silhouette within normal limits. IMPRESSION: New bilateral mid to lower lung patchy opacities. Hypoinflated lungs.  Electronically Signed   By: Stana Bunting M.D.   On: 01/06/2020 13:57    My personal review of EKG: Rhythm NSR, Rate  99 /min, QTc453   Assessment & Plan:    Active Problems:   Acute respiratory disease due to COVID-19 virus   Essential hypertension   Hypothyroid  Acute hypoxic respiratory failure due to COVID-19 pneumonia -She is unvaccinated. -Chest x-ray significant for multifocal bilateral opacity, he tested positive for COVID-19 8/27 at outside facility, -She is 83% on room air with activity, 86% at rest, continue with oxygen to keep oxygen saturation 93 or above -Continue with IV remdesivir  -We will start on IV Decadron 10 mg IV daily. -Given minimal oxygen requirement will hold on baricitinib or Actemra -Was encouraged use incentive spirometry, flutter valve, to prone and to get out of bed to chair -Continue to trend inflammatory markers -Check procalcitonin, if elevated will start on Rocephin and azithromycin.  Hypertension -Continue with home medications  Diabetes mellitus -Check A1c, hold Metformin, start Tradjenta, start 5 of Levemir daily, and sliding scale.  Seronegative rheumatoid arthritis -Following with [facility] rheumatology  Hypothyroidism -Continue with Synthroid  GERD -Continue with PPI   DVT Prophylaxis   Lovenox   AM Labs Ordered, also please review Full Orders  Family Communication: Admission, patients condition and plan of care including tests being ordered have been discussed with the patient  who indicate understanding and agree with the plan and Code Status.  Code Status Full  Likely DC to  home  Condition GUARDED    Consults called: none  Admission status: inpatient  Time spent: 60 minutes  Huey Bienenstock M.D on 2020-01-06 at 3:30 PM   [facility] - Office  [phone]

## 2019-12-11 DIAGNOSIS — M069 Rheumatoid arthritis, unspecified: Secondary | ICD-10-CM

## 2019-12-11 DIAGNOSIS — K219 Gastro-esophageal reflux disease without esophagitis: Secondary | ICD-10-CM | POA: Diagnosis present

## 2019-12-11 DIAGNOSIS — J1282 Pneumonia due to coronavirus disease 2019: Secondary | ICD-10-CM

## 2019-12-11 DIAGNOSIS — E119 Type 2 diabetes mellitus without complications: Secondary | ICD-10-CM

## 2019-12-11 LAB — BLOOD CULTURE ID PANEL (REFLEXED) - BCID2

## 2019-12-11 LAB — COMPREHENSIVE METABOLIC PANEL
ALT: 33 U/L (ref 0–44)
AST: 40 U/L (ref 15–41)
Albumin: 3.2 g/dL — ABNORMAL LOW (ref 3.5–5.0)
Alkaline Phosphatase: 49 U/L (ref 38–126)
Anion gap: 13 (ref 5–15)
BUN: 13 mg/dL (ref 6–20)
CO2: 21 mmol/L — ABNORMAL LOW (ref 22–32)
Calcium: 8 mg/dL — ABNORMAL LOW (ref 8.9–10.3)
Chloride: 102 mmol/L (ref 98–111)
Creatinine, Ser: 0.68 mg/dL (ref 0.44–1.00)
GFR calc Af Amer: >60 mL/min (ref >60)
GFR calc non Af Amer: >60 mL/min (ref >60)
Glucose, Bld: 305 mg/dL — ABNORMAL HIGH (ref 70–99)
Potassium: 3.9 mmol/L (ref 3.5–5.1)
Sodium: 136 mmol/L (ref 135–145)
Total Bilirubin: 0.9 mg/dL (ref 0.3–1.2)
Total Protein: 7.1 g/dL (ref 6.5–8.1)

## 2019-12-11 LAB — CBC WITH DIFFERENTIAL/PLATELET
Abs Immature Granulocytes: 0.02 10*3/uL (ref 0.00–0.07)
Basophils Absolute: 0 10*3/uL (ref 0.0–0.1)
Basophils Relative: 0 %
Eosinophils Absolute: 0 10*3/uL (ref 0.0–0.5)
Eosinophils Relative: 0 %
HCT: 47.7 % — ABNORMAL HIGH (ref 36.0–46.0)
Hemoglobin: 15.3 g/dL — ABNORMAL HIGH (ref 12.0–15.0)
Immature Granulocytes: 1 %
Lymphocytes Relative: 32 %
Lymphs Abs: 0.6 10*3/uL — ABNORMAL LOW (ref 0.7–4.0)
MCH: 29.9 pg (ref 26.0–34.0)
MCHC: 32.1 g/dL (ref 30.0–36.0)
MCV: 93.2 fL (ref 80.0–100.0)
Monocytes Absolute: 0.3 10*3/uL (ref 0.1–1.0)
Monocytes Relative: 16 %
Neutro Abs: 1 10*3/uL — ABNORMAL LOW (ref 1.7–7.7)
Neutrophils Relative %: 51 %
Platelets: 194 10*3/uL (ref 150–400)
RBC: 5.12 MIL/uL — ABNORMAL HIGH (ref 3.87–5.11)
RDW: 13.3 % (ref 11.5–15.5)
WBC: 2 10*3/uL — ABNORMAL LOW (ref 4.0–10.5)
nRBC: 0 % (ref 0.0–0.2)

## 2019-12-11 LAB — C-REACTIVE PROTEIN: CRP: 12.4 mg/dL — ABNORMAL HIGH (ref <1.0)

## 2019-12-11 LAB — CBG MONITORING, ED
Glucose-Capillary: 283 mg/dL — ABNORMAL HIGH (ref 70–99)
Glucose-Capillary: 309 mg/dL — ABNORMAL HIGH (ref 70–99)
Glucose-Capillary: 258 mg/dL — ABNORMAL HIGH (ref 70–99)

## 2019-12-11 LAB — GLUCOSE, CAPILLARY: Glucose-Capillary: 324 mg/dL — ABNORMAL HIGH (ref 70–99)

## 2019-12-11 LAB — D-DIMER, QUANTITATIVE: D-Dimer, Quant: 0.41 ug/mL-FEU (ref 0.00–0.50)

## 2019-12-11 MED ORDER — DEXAMETHASONE SODIUM PHOSPHATE 10 MG/ML IJ SOLN
6.0000 mg | INTRAMUSCULAR | Status: DC
  Administered 2019-12-11: 6 mg via INTRAVENOUS
  Filled 2019-12-11: qty 1

## 2019-12-11 MED ORDER — ALPRAZOLAM 0.25 MG PO TABS
0.2500 mg | ORAL_TABLET | Freq: Three times a day (TID) | ORAL | Status: DC | PRN
  Administered 2019-12-11: 0.25 mg via ORAL
  Filled 2019-12-11: qty 1

## 2019-12-11 MED ORDER — INSULIN DETEMIR 100 UNIT/ML ~~LOC~~ SOLN
15.0000 [IU] | Freq: Two times a day (BID) | SUBCUTANEOUS | Status: DC
  Administered 2019-12-11: 15 [IU] via SUBCUTANEOUS
  Filled 2019-12-11 (×4): qty 0.15

## 2019-12-11 MED ORDER — ENOXAPARIN SODIUM 60 MG/0.6ML ~~LOC~~ SOLN
60.0000 mg | SUBCUTANEOUS | Status: DC
  Administered 2019-12-11 – 2019-12-15 (×5): 60 mg via SUBCUTANEOUS
  Filled 2019-12-11 (×5): qty 0.6

## 2019-12-11 MED ORDER — INSULIN ASPART 100 UNIT/ML ~~LOC~~ SOLN
10.0000 [IU] | Freq: Three times a day (TID) | SUBCUTANEOUS | Status: DC
  Administered 2019-12-11 (×2): 10 [IU] via SUBCUTANEOUS
  Filled 2019-12-11: qty 1

## 2019-12-11 MED ORDER — INSULIN ASPART 100 UNIT/ML ~~LOC~~ SOLN
0.0000 [IU] | Freq: Every day | SUBCUTANEOUS | Status: DC
  Administered 2019-12-11: 4 [IU] via SUBCUTANEOUS
  Administered 2019-12-12: 3 [IU] via SUBCUTANEOUS
  Administered 2019-12-13: 4 [IU] via SUBCUTANEOUS
  Administered 2019-12-14: 2 [IU] via SUBCUTANEOUS
  Administered 2019-12-15 – 2019-12-16 (×2): 4 [IU] via SUBCUTANEOUS
  Administered 2019-12-17: 2 [IU] via SUBCUTANEOUS
  Administered 2019-12-20: 3 [IU] via SUBCUTANEOUS
  Administered 2019-12-21: 2 [IU] via SUBCUTANEOUS
  Administered 2019-12-22: 4 [IU] via SUBCUTANEOUS
  Administered 2019-12-23 – 2019-12-24 (×2): 3 [IU] via SUBCUTANEOUS
  Administered 2019-12-25: 4 [IU] via SUBCUTANEOUS
  Administered 2019-12-26: 2 [IU] via SUBCUTANEOUS
  Administered 2019-12-27: 5 [IU] via SUBCUTANEOUS

## 2019-12-11 MED ORDER — INSULIN ASPART 100 UNIT/ML ~~LOC~~ SOLN
0.0000 [IU] | Freq: Three times a day (TID) | SUBCUTANEOUS | Status: DC
  Administered 2019-12-11: 11 [IU] via SUBCUTANEOUS
  Administered 2019-12-12: 15 [IU] via SUBCUTANEOUS
  Administered 2019-12-12 – 2019-12-13 (×4): 11 [IU] via SUBCUTANEOUS
  Administered 2019-12-13: 15 [IU] via SUBCUTANEOUS
  Administered 2019-12-14 (×2): 11 [IU] via SUBCUTANEOUS
  Administered 2019-12-14: 7 [IU] via SUBCUTANEOUS
  Administered 2019-12-15: 4 [IU] via SUBCUTANEOUS
  Administered 2019-12-15: 15 [IU] via SUBCUTANEOUS
  Administered 2019-12-15: 7 [IU] via SUBCUTANEOUS
  Administered 2019-12-16: 11 [IU] via SUBCUTANEOUS
  Administered 2019-12-16: 15 [IU] via SUBCUTANEOUS
  Administered 2019-12-16: 11 [IU] via SUBCUTANEOUS
  Administered 2019-12-17 (×3): 7 [IU] via SUBCUTANEOUS
  Administered 2019-12-18 (×3): 4 [IU] via SUBCUTANEOUS
  Administered 2019-12-19: 3 [IU] via SUBCUTANEOUS
  Administered 2019-12-20 – 2019-12-21 (×2): 7 [IU] via SUBCUTANEOUS
  Administered 2019-12-21: 15 [IU] via SUBCUTANEOUS
  Administered 2019-12-21 – 2019-12-22 (×2): 7 [IU] via SUBCUTANEOUS
  Administered 2019-12-22: 3 [IU] via SUBCUTANEOUS
  Administered 2019-12-22: 4 [IU] via SUBCUTANEOUS
  Administered 2019-12-23: 15 [IU] via SUBCUTANEOUS
  Administered 2019-12-23: 3 [IU] via SUBCUTANEOUS
  Administered 2019-12-23: 7 [IU] via SUBCUTANEOUS
  Administered 2019-12-24: 20 [IU] via SUBCUTANEOUS
  Administered 2019-12-24: 4 [IU] via SUBCUTANEOUS
  Administered 2019-12-25: 7 [IU] via SUBCUTANEOUS
  Administered 2019-12-25: 20 [IU] via SUBCUTANEOUS
  Administered 2019-12-26: 3 [IU] via SUBCUTANEOUS
  Administered 2019-12-26: 11 [IU] via SUBCUTANEOUS
  Administered 2019-12-27: 3 [IU] via SUBCUTANEOUS
  Administered 2019-12-27: 11 [IU] via SUBCUTANEOUS
  Administered 2019-12-27: 20 [IU] via SUBCUTANEOUS
  Administered 2019-12-28 (×2): 7 [IU] via SUBCUTANEOUS

## 2019-12-11 NOTE — Progress Notes (Addendum)
PROGRESS NOTE   Linda Richmond  FGH:[id number] DOB: 05-22-1965 DOA: 2019-12-27 PCP: Ignatius Specking, MD   Chief Complaint  Patient presents with   Shortness of Breath    Brief Admission History:  54 y.o. female, with past medical history of anxiety, depression, diabetes, GERD, hypertension, vitamin D deficiency, negative rheumatoid arthritis, she presents to ED secondary to shortness of breath, reported began yesterday, patient reports for last week she started to have Covid symptoms including generalized body ache, headache, runny nose, nasal congestion, she was tested positive for COVID-19 on 8/27, prescribed Z-Pak and prednisone, she has finished both without much improvement, reports she started to have dyspnea yesterday, her son is admitted to [facility] due to COVID-19 as well, patient is unvaccinated, patient denies any chest pain, hemoptysis.  In ED patient saturating 86% on room air at rest, 83% on room air with activity, chest x-ray significant for multifocal opacities due to COVID-19 pneumonia, glucose was elevated at 310, triage hospitalist consulted to admit.  Assessment & Plan:   Active Problems:   Acute respiratory disease due to COVID-19 virus   Essential hypertension   Hypothyroid   Diabetes mellitus without complication (HCC)   GERD (gastroesophageal reflux disease)   Rheumatoid arthritis (HCC)  Acute respiratory failure with hypoxia secondary to Covid 19 infection - Pt remains hypoxic requiring at least 3 L/min supplemental oxygen. Covid pneumonia - bilateral viral infiltrates seen on chest xray with multifocal opacities noted. Inflammatory markers remain high.  Type 2 diabetes mellitus with steroid induced hyperglycemia - started levemir 15 units twice daily plus novolog 10 units TIDAC plus resistant SSI coverage and CBG monitoring 5 times per day.  A1C pending.  Essential hypertension - she has been resumed on home blood pressure medications.  GERD - protonix  ordered for GI protection.  Rheumatoid arthritis - stable, follow up with outpatient rheumatologist in [place].  Leukopenia - from viral infection, follow.  Hypothyroidism - resumed home armour thyroid.   Positive blood culture - 1/2 positive for G+ cocci, I suspect this is likely a contaminant, will wait for final ID.  Morbid obesity - BMI>40.    DVT prophylaxis:  lovenox  Code Status:  Full  Family Communication: daughter updated telephone 9/4 Disposition:   Status is: Inpatient  Remains inpatient appropriate because:IV treatments appropriate due to intensity of illness or inability to take PO and Inpatient level of care appropriate due to severity of illness   Dispo: The patient is from: Home              Anticipated d/c is to: Home              Anticipated d/c date is: 3 days              Patient currently is not medically stable to d/c.  Consultants:  n/a  Procedures:  n/a  Antimicrobials:  Remdesivir 9/3>>   Subjective: Pt reports that she is SOB mostly with ambulation to bedside commode but at rest not very Short of breath.    Objective: Vitals:   12/11/19 0200 12/11/19 0908 12/11/19 1106 12/11/19 1148  BP: 118/66  (!) 141/91   Pulse: 69  80   Resp: (!) 26  (!) 24   Temp:    98.1 F (36.7 C)  TempSrc:    Oral  SpO2: 91% (!) 88% 90%   Weight:      Height:        Intake/Output Summary (Last 24 hours)  at 12/11/2019 1228 Last data filed at 12/11/2019 1147 Gross per 24 hour  Intake 1150.51 ml  Output --  Net 1150.51 ml   Filed Weights   12/22/2019 1248  Weight: 118.8 kg   Examination:  General exam: Appears calm and comfortable  Respiratory system: rales heard posteriorly at bases. Respiratory effort normal. Cardiovascular system: normal S1 & S2 heard, RRR. No JVD, murmurs, rubs, gallops or clicks. No pedal edema. Gastrointestinal system: Abdomen is nondistended, soft and nontender. No organomegaly or masses felt. Normal bowel sounds heard. Central  nervous system: Alert and oriented. No focal neurological deficits. Extremities: Symmetric 5 x 5 power. Skin: No rashes, lesions or ulcers Psychiatry: Judgement and insight appear normal. Mood & affect appropriate.   Data Reviewed: I have personally reviewed following labs and imaging studies  CBC: Recent Labs  Lab 12/30/2019 1330 12/11/19 0851  WBC 2.8* 2.0*  NEUTROABS 1.7 1.0*  HGB 15.7* 15.3*  HCT 48.1* 47.7*  MCV 92.3 93.2  PLT 179 194    Basic Metabolic Panel: Recent Labs  Lab 12/18/2019 1330 12/11/19 0851  NA 137 136  K 4.0 3.9  CL 102 102  CO2 22 21*  GLUCOSE 310* 305*  BUN 10 13  CREATININE 0.73 0.68  CALCIUM 8.1* 8.0*    GFR: Estimated Creatinine Clearance: 96.2 mL/min (by C-G formula based on SCr of 0.68 mg/dL).  Liver Function Tests: Recent Labs  Lab 12/18/2019 1544 12/11/19 0851  AST 44* 40  ALT 32 33  ALKPHOS 53 49  BILITOT 0.6 0.9  PROT 7.4 7.1  ALBUMIN 3.4* 3.2*    CBG: Recent Labs  Lab 12/23/2019 1332 12/08/2019 1726 01/06/2020 2148 12/11/19 0806 12/11/19 1150  GLUCAP 299* 217* 367* 283* 309*    Recent Results (from the past 240 hour(s))  SARS Coronavirus 2 by RT PCR (hospital order, performed in [facility] hospital lab) Nasopharyngeal Nasopharyngeal Swab     Status: Abnormal   Collection Time: 12/12/2019  3:32 PM   Specimen: Nasopharyngeal Swab  Result Value Ref Range Status   SARS Coronavirus 2 POSITIVE (A) NEGATIVE Final    Comment: RESULT CALLED TO, READ BACK BY AND VERIFIED WITH: TALBOTT,T @ 1846 ON 01/06/2020 BY JUW (NOTE) SARS-CoV-2 target nucleic acids are DETECTED  SARS-CoV-2 RNA is generally detectable in upper respiratory specimens  during the acute phase of infection.  Positive results are indicative  of the presence of the identified virus, but do not rule out bacterial infection or co-infection with other pathogens not detected by the test.  Clinical correlation with patient history and  other diagnostic information is  necessary to determine patient infection status.  The expected result is negative.  Fact Sheet for Patients:   BoilerBrush.com.cy   Fact Sheet for Healthcare Providers:   https://pope.com/    This test is not yet approved or cleared by the [place] FDA and  has been authorized for detection and/or diagnosis of SARS-CoV-2 by FDA under an Emergency Use Authorization (EUA).  This EUA will remain in effect (meaning this tes t can be used) for the duration of  the COVID-19 declaration under Section 564(b)(1) of the Act, 21 U.S.C. section 360-bbb-3(b)(1), unless the authorization is terminated or revoked sooner.  Performed at [facility], [address] [place], [place] [address]   Blood Culture (routine x 2)     Status: None (Preliminary result)   Collection Time: 12/22/2019  3:43 PM   Specimen: Right Antecubital; Blood  Result Value Ref Range Status  Specimen Description RIGHT ANTECUBITAL  Final   Special Requests   Final    BOTTLES DRAWN AEROBIC AND ANAEROBIC Blood Culture adequate volume   Culture  Setup Time   Final    GRAM POSITIVE COCCI ANAEROBIC BOTTLE ONLY Gram Stain Report Called to,Read Back By and Verified With: BAYNES @ 1220 ON [id number] BY HENDERSON L.    Culture   Final    NO GROWTH < 24 HOURS Performed at [facility], [address] [place], [place] [address]    Report Status PENDING  Incomplete  Blood Culture (routine x 2)     Status: None (Preliminary result)   Collection Time: 12-11-19  3:44 PM   Specimen: Left Antecubital; Blood  Result Value Ref Range Status   Specimen Description LEFT ANTECUBITAL  Final   Special Requests   Final    BOTTLES DRAWN AEROBIC ONLY Blood Culture results may not be optimal due to an inadequate volume of blood received in culture bottles   Culture   Final    NO GROWTH < 24 HOURS Performed at [facility], [address] [place], [place] [address]    Report Status PENDING   Incomplete     Radiology Studies: DG Chest Port 1 View  Result Date: 2019-12-11 CLINICAL DATA:  COVID positive EXAM: PORTABLE CHEST 1 VIEW COMPARISON:  04/19/2019 chest radiograph and prior. FINDINGS: Hypoinflated lungs. Bilateral mid to lower lung patchy opacities, new since prior exam. No pneumothorax. Trace bilateral effusions. Cardiomediastinal silhouette within normal limits. IMPRESSION: New bilateral mid to lower lung patchy opacities. Hypoinflated lungs. Electronically Signed   By: Stana Bunting M.D.   On: 2019-12-11 13:57   Scheduled Meds:  dexamethasone (DECADRON) injection  6 mg Intravenous Q24H   enoxaparin (LOVENOX) injection  60 mg Subcutaneous Q24H   insulin aspart  0-20 Units Subcutaneous TID WC   insulin aspart  0-5 Units Subcutaneous QHS   insulin aspart  10 Units Subcutaneous TID WC   insulin detemir  15 Units Subcutaneous BID   Ipratropium-Albuterol  1 puff Inhalation Q6H   irbesartan  37.5 mg Oral Daily   linagliptin  5 mg Oral Daily   thyroid  30 mg Oral QAC breakfast   Vitamin D (Ergocalciferol)  50,000 Units Oral Q7 days   Continuous Infusions:  remdesivir 100 mg in NS 100 mL Stopped (12/11/19 1143)    LOS: 1 day   Time spent: 32 minutes   Kylan Liberati Laural Benes, MD How to contact the [facility] Attending or Consulting provider 7A - 7P or covering provider during after hours 7P -7A, for this patient?  Check the care team in [facility] and look for a) attending/consulting [facility] provider listed and b) the [facility] team listed Log into [email or web address] and use [place] universal password to access. If you do not have the password, please contact the hospital operator. Locate the [facility] provider you are looking for under [facility] and page to a number that you can be directly reached. If you still have difficulty reaching the provider, please page the [facility] (Director on Call) for the Hospitalists listed on amion for assistance.  12/11/2019, 12:28 PM

## 2019-12-11 NOTE — ED Notes (Signed)
Pt placed in prone position.  sats increased to 90% with 4 L/M

## 2019-12-11 NOTE — Progress Notes (Signed)
1/4 positive blood cultures reflex ID came back showing Staph epidermidis, this is most likely contaminant specially with normal procalcitonin, will continue to watch off antibiotics. Huey Bienenstock MD

## 2019-12-11 NOTE — ED Notes (Signed)
Date and time results received: 12/11/19 1222 (use smartphrase ".now" to insert current time)  Test: blood culture  Critical Value: gram + cocci  Name of Provider Notified: Dr Laural Benes  Orders Received? Or Actions Taken?: na

## 2019-12-11 NOTE — ED Notes (Signed)
Date and time results received: 12/11/19 1925(use smartphrase ".now" to insert current time)  Test: Staphylococcus species and epidermidis Critical Value: detected  Name of Provider Notified: Dr. Randol Kern   Orders Received? Or Actions Taken?: na

## 2019-12-11 NOTE — ED Notes (Signed)
Increased O2 up to 4 L/M due to sats 86-87%.

## 2019-12-12 LAB — COMPREHENSIVE METABOLIC PANEL
ALT: 30 U/L (ref 0–44)
AST: 36 U/L (ref 15–41)
Albumin: 3.1 g/dL — ABNORMAL LOW (ref 3.5–5.0)
Alkaline Phosphatase: 50 U/L (ref 38–126)
Anion gap: 14 (ref 5–15)
BUN: 17 mg/dL (ref 6–20)
CO2: 19 mmol/L — ABNORMAL LOW (ref 22–32)
Calcium: 8.4 mg/dL — ABNORMAL LOW (ref 8.9–10.3)
Chloride: 104 mmol/L (ref 98–111)
Creatinine, Ser: 0.65 mg/dL (ref 0.44–1.00)
GFR calc Af Amer: >60 mL/min (ref >60)
GFR calc non Af Amer: >60 mL/min (ref >60)
Glucose, Bld: 297 mg/dL — ABNORMAL HIGH (ref 70–99)
Potassium: 4.1 mmol/L (ref 3.5–5.1)
Sodium: 137 mmol/L (ref 135–145)
Total Bilirubin: 0.6 mg/dL (ref 0.3–1.2)
Total Protein: 7.1 g/dL (ref 6.5–8.1)

## 2019-12-12 LAB — CBC WITH DIFFERENTIAL/PLATELET
Abs Immature Granulocytes: 0.02 10*3/uL (ref 0.00–0.07)
Basophils Absolute: 0 10*3/uL (ref 0.0–0.1)
Basophils Relative: 0 %
Eosinophils Absolute: 0 10*3/uL (ref 0.0–0.5)
Eosinophils Relative: 0 %
HCT: 53 % — ABNORMAL HIGH (ref 36.0–46.0)
Hemoglobin: 16.8 g/dL — ABNORMAL HIGH (ref 12.0–15.0)
Immature Granulocytes: 1 %
Lymphocytes Relative: 32 %
Lymphs Abs: 0.8 10*3/uL (ref 0.7–4.0)
MCH: 30.1 pg (ref 26.0–34.0)
MCHC: 31.7 g/dL (ref 30.0–36.0)
MCV: 95 fL (ref 80.0–100.0)
Monocytes Absolute: 0.3 10*3/uL (ref 0.1–1.0)
Monocytes Relative: 12 %
Neutro Abs: 1.4 10*3/uL — ABNORMAL LOW (ref 1.7–7.7)
Neutrophils Relative %: 55 %
Platelets: 210 10*3/uL (ref 150–400)
RBC: 5.58 MIL/uL — ABNORMAL HIGH (ref 3.87–5.11)
RDW: 13.2 % (ref 11.5–15.5)
WBC: 2.6 10*3/uL — ABNORMAL LOW (ref 4.0–10.5)
nRBC: 0 % (ref 0.0–0.2)

## 2019-12-12 LAB — D-DIMER, QUANTITATIVE: D-Dimer, Quant: 0.32 ug/mL-FEU (ref 0.00–0.50)

## 2019-12-12 LAB — GLUCOSE, CAPILLARY
Glucose-Capillary: 284 mg/dL — ABNORMAL HIGH (ref 70–99)
Glucose-Capillary: 335 mg/dL — ABNORMAL HIGH (ref 70–99)
Glucose-Capillary: 289 mg/dL — ABNORMAL HIGH (ref 70–99)
Glucose-Capillary: 256 mg/dL — ABNORMAL HIGH (ref 70–99)

## 2019-12-12 LAB — C-REACTIVE PROTEIN: CRP: 9.1 mg/dL — ABNORMAL HIGH (ref <1.0)

## 2019-12-12 MED ORDER — BARICITINIB 2 MG PO TABS
4.0000 mg | ORAL_TABLET | Freq: Every day | ORAL | Status: AC
  Administered 2019-12-12 – 2019-12-25 (×14): 4 mg via ORAL
  Filled 2019-12-12 (×14): qty 2

## 2019-12-12 MED ORDER — INSULIN DETEMIR 100 UNIT/ML ~~LOC~~ SOLN: 20.0000 [IU] | Freq: Two times a day (BID) | SUBCUTANEOUS | Status: DC

## 2019-12-12 MED ORDER — INSULIN ASPART 100 UNIT/ML ~~LOC~~ SOLN
14.0000 [IU] | Freq: Three times a day (TID) | SUBCUTANEOUS | Status: DC
  Administered 2019-12-12: 14 [IU] via SUBCUTANEOUS

## 2019-12-12 MED ORDER — SODIUM CHLORIDE 0.9 % IV SOLN
INTRAVENOUS | Status: DC | PRN
  Administered 2019-12-12 – 2019-12-28 (×2): 250 mL via INTRAVENOUS
  Administered 2019-12-29 – 2020-01-01 (×2): 500 mL via INTRAVENOUS
  Administered 2020-01-03 (×2): 250 mL via INTRAVENOUS
  Administered 2020-01-05: 1000 mL via INTRAVENOUS

## 2019-12-12 MED ORDER — INSULIN DETEMIR 100 UNIT/ML ~~LOC~~ SOLN
25.0000 [IU] | Freq: Two times a day (BID) | SUBCUTANEOUS | Status: DC
  Administered 2019-12-12 (×2): 25 [IU] via SUBCUTANEOUS
  Filled 2019-12-12 (×3): qty 0.25

## 2019-12-12 MED ORDER — METHYLPREDNISOLONE SODIUM SUCC 125 MG IJ SOLR
50.0000 mg | Freq: Two times a day (BID) | INTRAMUSCULAR | Status: DC
  Administered 2019-12-12 – 2019-12-17 (×11): 50 mg via INTRAVENOUS
  Filled 2019-12-12 (×11): qty 2

## 2019-12-12 MED ORDER — ALPRAZOLAM 0.5 MG PO TABS
0.5000 mg | ORAL_TABLET | Freq: Three times a day (TID) | ORAL | Status: DC | PRN
  Administered 2019-12-12 – 2019-12-26 (×7): 0.5 mg via ORAL
  Filled 2019-12-12 (×8): qty 1

## 2019-12-12 NOTE — Progress Notes (Signed)
Patient's O2 saturation 85-87% on HFNC 9L. Patient was placed on 10 L HFNC  With saturations 90% in side lying position. Patient has been educated on the importance of laying in the prone position and the use of incentive spirometer and flutter valve. Will continue to monitor patient.

## 2019-12-12 NOTE — Progress Notes (Signed)
MD and RT notified of pt O2 sat 74% on 5L. O2 increased to 8L [place] while pt proning, and sats improved to 87%. MD ordered high flow to maintain sats above 85%

## 2019-12-12 NOTE — Progress Notes (Signed)
PROGRESS NOTE   Linda Richmond  BMS:[id number] DOB: [birth year] DOA: 12-Dec-2019 PCP: Ignatius Specking, MD   Chief Complaint  Patient presents with  . Shortness of Breath   Brief Admission History:  54 y.o. female, with past medical history of anxiety, depression, diabetes, GERD, hypertension, vitamin D deficiency, negative rheumatoid arthritis, she presents to ED secondary to shortness of breath, reported began yesterday, patient reports for last week she started to have Covid symptoms including generalized body ache, headache, runny nose, nasal congestion, she was tested positive for COVID-19 on 8/27, prescribed Z-Pak and prednisone, she has finished both without much improvement, reports she started to have dyspnea yesterday, her son is admitted to [facility] due to COVID-19 as well, patient is unvaccinated, patient denies any chest pain, hemoptysis.  In ED patient saturating 86% on room air at rest, 83% on room air with activity, chest x-ray significant for multifocal opacities due to COVID-19 pneumonia, glucose was elevated at 310, triage hospitalist consulted to admit.  Assessment & Plan:   Active Problems:   Acute respiratory disease due to COVID-19 virus   Essential hypertension   Hypothyroid   Diabetes mellitus without complication (HCC)   GERD (gastroesophageal reflux disease)   Rheumatoid arthritis (HCC)  1. Acute respiratory failure with hypoxia secondary to Covid 19 infection - Pt has had further respiratory decompensation and hypoxic requiring at least 10 L/min supplemental oxygen.  She has been started on baricitinib.  2. Covid pneumonia - bilateral viral infiltrates seen on chest xray with multifocal opacities noted. Inflammatory markers remain high.  3. Type 2 diabetes mellitus with steroid induced hyperglycemia - Increased levemir 25 units twice daily plus novolog 14 units TIDAC plus resistant SSI coverage and CBG monitoring 5 times per day.  A1C 10.8% which is evidence of  poorly controlled disease at baseline.   4. Essential hypertension - she has been resumed on home blood pressure medications.  5. GERD - protonix ordered for GI protection.  6. Rheumatoid arthritis - stable, follow up with outpatient rheumatologist in [place].  7. Leukopenia - from viral infection, follow.  8. Hypothyroidism - resumed home armour thyroid.   9. Positive blood culture - 1/2 positive for staph epidermidis which is a contaminant.  10. Morbid obesity - BMI>40.    DVT prophylaxis:  lovenox  Code Status:  Full  Family Communication: daughter updated telephone 9/4 Disposition: TBD  Status is: Inpatient  Remains inpatient appropriate because:IV treatments appropriate due to intensity of illness or inability to take PO and Inpatient level of care appropriate due to severity of illness  Dispo: The patient is from: Home              Anticipated d/c is to: Home              Anticipated d/c date is: > 3 days              Patient currently is not medically stable to d/c.  Consultants:   n/a  Procedures:   n/a  Antimicrobials:  Remdesivir 9/3>>   Subjective: Pt reports that she has terrible cough, chest congestion and shortness of breath, she is now on 10L HFNC.     Objective: Vitals:   12/12/19 0110 12/12/19 0355 12/12/19 0857 12/12/19 0900  BP:  110/64    Pulse:  63    Resp:  (!) 22    Temp:  98.1 F (36.7 C)    TempSrc:  Oral    SpO2: Marland Kitchen)  80% 90% 94% 94%  Weight:      Height:       No intake or output data in the 24 hours ending 12/12/19 1314 Filed Weights   01/04/2020 1248  Weight: 118.8 kg   Examination:  General exam: Appears calm and comfortable, NAD.   Respiratory system: rales heard posteriorly at bases unchanged. Respiratory effort normal. Cardiovascular system: normal S1 & S2 heard. No JVD, murmurs, rubs, gallops or clicks. No pedal edema. Gastrointestinal system: Abdomen is nondistended, soft and nontender. No organomegaly or masses felt.  Normal bowel sounds heard. Central nervous system: Alert and oriented. No focal neurological deficits. Extremities: Symmetric 5 x 5 power. Skin: No rashes, lesions or ulcers Psychiatry: Judgement and insight appear normal. Mood & affect appropriate.   Data Reviewed: I have personally reviewed following labs and imaging studies  CBC: Recent Labs  Lab 12/19/2019 1330 12/11/19 0851 12/12/19 0755  WBC 2.8* 2.0* 2.6*  NEUTROABS 1.7 1.0* 1.4*  HGB 15.7* 15.3* 16.8*  HCT 48.1* 47.7* 53.0*  MCV 92.3 93.2 95.0  PLT 179 194 210    Basic Metabolic Panel: Recent Labs  Lab 01/04/2020 1330 12/11/19 0851 12/12/19 0755  NA 137 136 137  K 4.0 3.9 4.1  CL 102 102 104  CO2 22 21* 19*  GLUCOSE 310* 305* 297*  BUN 10 13 17   CREATININE 0.73 0.68 0.65  CALCIUM 8.1* 8.0* 8.4*    GFR: Estimated Creatinine Clearance: 96.2 mL/min (by C-G formula based on SCr of 0.65 mg/dL).  Liver Function Tests: Recent Labs  Lab 12/09/2019 1544 12/11/19 0851 12/12/19 0755  AST 44* 40 36  ALT 32 33 30  ALKPHOS 53 49 50  BILITOT 0.6 0.9 0.6  PROT 7.4 7.1 7.1  ALBUMIN 3.4* 3.2* 3.1*    CBG: Recent Labs  Lab 12/11/19 1150 12/11/19 1702 12/11/19 2221 12/12/19 0727 12/12/19 1210  GLUCAP 309* 258* 324* 284* 335*    Recent Results (from the past 240 hour(s))  SARS Coronavirus 2 by RT PCR (hospital order, performed in [facility] hospital lab) Nasopharyngeal Nasopharyngeal Swab     Status: Abnormal   Collection Time: 12/31/2019  3:32 PM   Specimen: Nasopharyngeal Swab  Result Value Ref Range Status   SARS Coronavirus 2 POSITIVE (A) NEGATIVE Final    Comment: RESULT CALLED TO, READ BACK BY AND VERIFIED WITH: TALBOTT,T @ 1846 ON 12/16/2019 BY JUW (NOTE) SARS-CoV-2 target nucleic acids are DETECTED  SARS-CoV-2 RNA is generally detectable in upper respiratory specimens  during the acute phase of infection.  Positive results are indicative  of the presence of the identified virus, but do not rule  out bacterial infection or co-infection with other pathogens not detected by the test.  Clinical correlation with patient history and  other diagnostic information is necessary to determine patient infection status.  The expected result is negative.  Fact Sheet for Patients:   02/09/20   Fact Sheet for Healthcare Providers:   BoilerBrush.com.cy    This test is not yet approved or cleared by the https://pope.com/ FDA and  has been authorized for detection and/or diagnosis of SARS-CoV-2 by FDA under an Emergency Use Authorization (EUA).  This EUA will remain in effect (meaning this tes t can be used) for the duration of  the COVID-19 declaration under Section 564(b)(1) of the Act, 21 U.S.C. section 360-bbb-3(b)(1), unless the authorization is terminated or revoked sooner.  Performed at [facility], [address] [place], [place]   Blood Culture (routine  x 2)     Status: Abnormal (Preliminary result)   Collection Time: 01/04/2020  3:43 PM   Specimen: Right Antecubital; Blood  Result Value Ref Range Status   Specimen Description   Final    RIGHT ANTECUBITAL Performed at [facility], [address], [place], [place] [address]    Special Requests   Final    BOTTLES DRAWN AEROBIC AND ANAEROBIC Blood Culture adequate volume Performed at [facility], [address], [place], [place] [address]    Culture  Setup Time   Final    GRAM POSITIVE COCCI ANAEROBIC BOTTLE ONLY Gram Stain Report Called to,Read Back By and Verified With: BAYNES @ 1220 ON [id number] BY HENDERSON L. CRITICAL RESULT CALLED TO, READ BACK BY AND VERIFIED WITH: RN BRANDY BAYNES 1836 [phone] FCP    Culture (A)  Final    STAPHYLOCOCCUS EPIDERMIDIS THE SIGNIFICANCE OF ISOLATING THIS ORGANISM FROM A SINGLE SET OF BLOOD CULTURES WHEN MULTIPLE SETS ARE DRAWN IS UNCERTAIN. PLEASE NOTIFY THE MICROBIOLOGY DEPARTMENT WITHIN ONE WEEK IF SPECIATION AND SENSITIVITIES ARE  REQUIRED. Performed at [facility], [address] [place], [place] [address]    Report Status PENDING  Incomplete  Blood Culture ID Panel (Reflexed)     Status: Abnormal   Collection Time: 12/19/2019  3:43 PM  Result Value Ref Range Status   Enterococcus faecalis NOT DETECTED NOT DETECTED Final   Enterococcus Faecium NOT DETECTED NOT DETECTED Final   Listeria monocytogenes NOT DETECTED NOT DETECTED Final   Staphylococcus species DETECTED (A) NOT DETECTED Final    Comment: CRITICAL RESULT CALLED TO, READ BACK BY AND VERIFIED WITH: RN BRANDY BAYNES 1836 [phone] FCP    Staphylococcus aureus (BCID) NOT DETECTED NOT DETECTED Final   Staphylococcus epidermidis DETECTED (A) NOT DETECTED Final    Comment: CRITICAL RESULT CALLED TO, READ BACK BY AND VERIFIED WITH: RN BRANDY BAYNES 1836 [phone] FCP    Staphylococcus lugdunensis NOT DETECTED NOT DETECTED Final   Streptococcus species NOT DETECTED NOT DETECTED Final   Streptococcus agalactiae NOT DETECTED NOT DETECTED Final   Streptococcus pneumoniae NOT DETECTED NOT DETECTED Final   Streptococcus pyogenes NOT DETECTED NOT DETECTED Final   A.calcoaceticus-baumannii NOT DETECTED NOT DETECTED Final   Bacteroides fragilis NOT DETECTED NOT DETECTED Final   Enterobacterales NOT DETECTED NOT DETECTED Final   Enterobacter cloacae complex NOT DETECTED NOT DETECTED Final   Escherichia coli NOT DETECTED NOT DETECTED Final   Klebsiella aerogenes NOT DETECTED NOT DETECTED Final   Klebsiella oxytoca NOT DETECTED NOT DETECTED Final   Klebsiella pneumoniae NOT DETECTED NOT DETECTED Final   Proteus species NOT DETECTED NOT DETECTED Final   Salmonella species NOT DETECTED NOT DETECTED Final   Serratia marcescens NOT DETECTED NOT DETECTED Final   Haemophilus influenzae NOT DETECTED NOT DETECTED Final   Neisseria meningitidis NOT DETECTED NOT DETECTED Final   Pseudomonas aeruginosa NOT DETECTED NOT DETECTED Final   Stenotrophomonas maltophilia NOT  DETECTED NOT DETECTED Final   Candida albicans NOT DETECTED NOT DETECTED Final   Candida auris NOT DETECTED NOT DETECTED Final   Candida glabrata NOT DETECTED NOT DETECTED Final   Candida krusei NOT DETECTED NOT DETECTED Final   Candida parapsilosis NOT DETECTED NOT DETECTED Final   Candida tropicalis NOT DETECTED NOT DETECTED Final   Cryptococcus neoformans/gattii NOT DETECTED NOT DETECTED Final   Methicillin resistance mecA/C NOT DETECTED NOT DETECTED Final    Comment: Performed at [facility], [address], [place], [place] [address]  Blood  Culture (routine x 2)     Status: None (Preliminary result)   Collection Time: 12/15/2019  3:44 PM   Specimen: Left Antecubital; Blood  Result Value Ref Range Status   Specimen Description LEFT ANTECUBITAL  Final   Special Requests   Final    BOTTLES DRAWN AEROBIC ONLY Blood Culture results may not be optimal due to an inadequate volume of blood received in culture bottles   Culture   Final    NO GROWTH < 24 HOURS Performed at [facility], [address] [place], [place] [address]    Report Status PENDING  Incomplete     Radiology Studies: DG Chest Port 1 View  Result Date: 12/13/2019 CLINICAL DATA:  COVID positive EXAM: PORTABLE CHEST 1 VIEW COMPARISON:  04/19/2019 chest radiograph and prior. FINDINGS: Hypoinflated lungs. Bilateral mid to lower lung patchy opacities, new since prior exam. No pneumothorax. Trace bilateral effusions. Cardiomediastinal silhouette within normal limits. IMPRESSION: New bilateral mid to lower lung patchy opacities. Hypoinflated lungs. Electronically Signed   By: Stana Bunting M.D.   On: 12/21/2019 13:57   Scheduled Meds: . baricitinib  4 mg Oral Daily  . enoxaparin (LOVENOX) injection  60 mg Subcutaneous Q24H  . insulin aspart  0-20 Units Subcutaneous TID WC  . insulin aspart  0-5 Units Subcutaneous QHS  . insulin aspart  14 Units Subcutaneous TID WC  . insulin detemir  25 Units Subcutaneous BID  .  Ipratropium-Albuterol  1 puff Inhalation Q6H  . irbesartan  37.5 mg Oral Daily  . linagliptin  5 mg Oral Daily  . methylPREDNISolone (SOLU-MEDROL) injection  50 mg Intravenous Q12H  . thyroid  30 mg Oral QAC breakfast  . Vitamin D (Ergocalciferol)  50,000 Units Oral Q7 days   Continuous Infusions: . sodium chloride 250 mL (12/12/19 0851)  . remdesivir 100 mg in NS 100 mL 100 mg (12/12/19 0856)    LOS: 2 days   Critical Care Procedure Note Authorized and Performed by: Maryln Manuel MD  Total Critical Care time:  33 mins Due to a high probability of clinically significant, life threatening deterioration, the patient required my highest level of preparedness to intervene emergently and I personally spent this critical care time directly and personally managing the patient.  This critical care time included obtaining a history; examining the patient, pulse oximetry; ordering and review of studies; arranging urgent treatment with development of a management plan; evaluation of patient's response of treatment; frequent reassessment; and discussions with other providers.  This critical care time was performed to assess and manage the high probability of imminent and life threatening deterioration that could result in multi-organ failure.  It was exclusive of separately billable procedures and treating other patients and teaching time.    Standley Dakins, MD How to contact the [facility] Attending or Consulting provider 7A - 7P or covering provider during after hours 7P -7A, for this patient?  1. Check the care team in [facility] and look for a) attending/consulting TRH provider listed and b) the [facility] team listed 2. Log into [email or web address] and use [place]'s universal password to access. If you do not have the password, please contact the hospital operator. 3. Locate the [facility] provider you are looking for under [facility] and page to a number that you can be directly reached. 4. If you still have difficulty  reaching the provider, please page the [facility] (Director on Call) for the Hospitalists listed on amion for assistance.  12/12/2019, 1:14 PM

## 2019-12-12 NOTE — Progress Notes (Signed)
Patient saturations 91% HFNC 10 L. Patient resting in bed but has dyspnea with any exertion. Vitals are as follows.    12/12/19 1342  Vitals  Temp 98.9 F (37.2 C)  Temp Source Oral  BP 140/76  BP Location Left Arm  BP Method Automatic  Patient Position (if appropriate) Lying  Pulse Rate 89  Pulse Rate Source Dinamap  Resp 20  MEWS COLOR  MEWS Score Color Green  Oxygen Therapy  SpO2 91 %  O2 Device HFNC  O2 Flow Rate (L/min) 10 L/min  Patient Activity (if Appropriate) In bed  Pain Assessment  Pain Scale 0-10  Pain Score 0  MEWS Score  MEWS Temp 0  MEWS Systolic 0  MEWS Pulse 0  MEWS RR 0  MEWS LOC 0  MEWS Score 0   Order from MD to transfer patient to ICU stepdown.

## 2019-12-12 NOTE — Progress Notes (Signed)
Pt placed on HFNC 9L, sats 89-90%. Pt in prone position. Pt was educated on importance of staying in prone and using respiratory devices (IS and FV).

## 2019-12-12 NOTE — Progress Notes (Signed)
Pt found to be on 10lpm spo2 81-84. I increased to 15lpm and got patient to self prone. Her SPO2 increased to 87-88%. She is in no distress and RR is in low to mid 20s. Pt is comfortable at this time.

## 2019-12-12 NOTE — Progress Notes (Signed)
Patient currently in Prone position and resting comfortably.

## 2019-12-13 LAB — GLUCOSE, CAPILLARY
Glucose-Capillary: 290 mg/dL — ABNORMAL HIGH (ref 70–99)
Glucose-Capillary: 307 mg/dL — ABNORMAL HIGH (ref 70–99)
Glucose-Capillary: 297 mg/dL — ABNORMAL HIGH (ref 70–99)
Glucose-Capillary: 288 mg/dL — ABNORMAL HIGH (ref 70–99)
Glucose-Capillary: 308 mg/dL — ABNORMAL HIGH (ref 70–99)

## 2019-12-13 LAB — CULTURE, BLOOD (ROUTINE X 2): Special Requests: ADEQUATE

## 2019-12-13 MED ORDER — INSULIN ASPART 100 UNIT/ML ~~LOC~~ SOLN
18.0000 [IU] | Freq: Three times a day (TID) | SUBCUTANEOUS | Status: DC
  Administered 2019-12-13 – 2019-12-19 (×13): 18 [IU] via SUBCUTANEOUS

## 2019-12-13 MED ORDER — INSULIN DETEMIR 100 UNIT/ML ~~LOC~~ SOLN
30.0000 [IU] | Freq: Two times a day (BID) | SUBCUTANEOUS | Status: DC
  Administered 2019-12-13 (×2): 30 [IU] via SUBCUTANEOUS
  Filled 2019-12-13 (×6): qty 0.3

## 2019-12-13 NOTE — Progress Notes (Signed)
Tele monitor displayed 82% oxygen sat. Upon assessing pt resting on her right side sating 90% on 15L HFNC. No increased wob, pt stated she was resting well.

## 2019-12-13 NOTE — Progress Notes (Signed)
Patient to be transferred to [facility]. Report has been called and given to [facility]. All questions were answered and no further questions at this time. Patient's daughter Eula Jaster has been notified of patient's transfer to [facility]. Patient is currently stable and in no acute distress at this time and will be transported via [facility].

## 2019-12-13 NOTE — Discharge Instructions (Addendum)
Carbohydrate Counting For People With Diabetes  Foods with carbohydrates make your blood glucose level go up. Learning how to count carbohydrates can help you control your blood glucose levels. First, identify the foods you eat that contain carbohydrates. Then, using the Foods with Carbohydrates chart, determine about how much carbohydrates are in your meals and snacks. Make sure you are eating foods with fiber, protein, and healthy fat along with your carbohydrate foods. Foods with Carbohydrates The following table shows carbohydrate foods that have about 15 grams of carbohydrate each. Using measuring cups, spoons, or a food scale when you first begin learning about carbohydrate counting can help you learn about the portion sizes you typically eat. The following foods have 15 grams carbohydrate each:  Grains . 1 slice bread (1 ounce)  . 1 small tortilla (6-inch size)  .  large bagel (1 ounce)  . 1/3 cup pasta or rice (cooked)  .  hamburger or hot dog bun ( ounce)  .  cup cooked cereal  .  to  cup ready-to-eat cereal  . 2 taco shells (5-inch size) Fruit . 1 small fresh fruit ( to 1 cup)  .  medium banana  . 17 small grapes (3 ounces)  . 1 cup melon or berries  .  cup canned or frozen fruit  . 2 tablespoons dried fruit (blueberries, cherries, cranberries, raisins)  .  cup unsweetened fruit juice  Starchy Vegetables .  cup cooked beans, peas, corn, potatoes/sweet potatoes  .  large baked potato (3 ounces)  . 1 cup acorn or butternut squash  Snack Foods . 3 to 6 crackers  . 8 potato chips or 13 tortilla chips ( ounce to 1 ounce)  . 3 cups popped popcorn  Dairy . 3/4 cup (6 ounces) nonfat plain yogurt, or yogurt with sugar-free sweetener  . 1 cup milk  . 1 cup plain rice, soy, coconut or flavored almond milk Sweets and Desserts .  cup ice cream or frozen yogurt  . 1 tablespoon jam, jelly, pancake syrup, table sugar, or honey  . 2 tablespoons light pancake syrup  . 1 inch  square of frosted cake or 2 inch square of unfrosted cake  . 2 small cookies (2/3 ounce each) or  large cookie  Sometimes you'll have to estimate carbohydrate amounts if you don't know the exact recipe. One cup of mixed foods like soups can have 1 to 2 carbohydrate servings, while some casseroles might have 2 or more servings of carbohydrate. Foods that have less than 20 calories in each serving can be counted as "free" foods. Count 1 cup raw vegetables, or  cup cooked non-starchy vegetables as "free" foods. If you eat 3 or more servings at one meal, then count them as 1 carbohydrate serving.  Foods without Carbohydrates  Not all foods contain carbohydrates. Meat, some dairy, fats, non-starchy vegetables, and many beverages don't contain carbohydrate. So when you count carbohydrates, you can generally exclude chicken, pork, beef, fish, seafood, eggs, tofu, cheese, butter, sour cream, avocado, nuts, seeds, olives, mayonnaise, water, black coffee, unsweetened tea, and zero-calorie drinks. Vegetables with no or low carbohydrate include green beans, cauliflower, tomatoes, and onions. How much carbohydrate should I eat at each meal?  Carbohydrate counting can help you plan your meals and manage your weight. Following are some starting points for carbohydrate intake at each meal. Work with your registered dietitian nutritionist to find the best range that works for your blood glucose and weight.   To Lose Weight To  Maintain Weight  Women 2 - 3 carb servings 3 - 4 carb servings  Men 3 - 4 carb servings 4 - 5 carb servings  Checking your blood glucose after meals will help you know if you need to adjust the timing, type, or number of carbohydrate servings in your meal plan. Achieve and keep a healthy body weight by balancing your food intake and physical activity.  Tips How should I plan my meals?  Plan for half the food on your plate to include non-starchy vegetables, like salad greens, broccoli, or  carrots. Try to eat 3 to 5 servings of non-starchy vegetables every day. Have a protein food at each meal. Protein foods include chicken, fish, meat, eggs, or beans (note that beans contain carbohydrate). These two food groups (non-starchy vegetables and proteins) are low in carbohydrate. If you fill up your plate with these foods, you will eat less carbohydrate but still fill up your stomach. Try to limit your carbohydrate portion to  of the plate.  What fats are healthiest to eat?  Diabetes increases risk for heart disease. To help protect your heart, eat more healthy fats, such as olive oil, nuts, and avocado. Eat less saturated fats like butter, cream, and high-fat meats, like bacon and sausage. Avoid trans fats, which are in all foods that list "partially hydrogenated oil" as an ingredient. What should I drink?  Choose drinks that are not sweetened with sugar. The healthiest choices are water, carbonated or seltzer waters, and tea and coffee without added sugars.  Sweet drinks will make your blood glucose go up very quickly. One serving of soda or energy drink is  cup. It is best to drink these beverages only if your blood glucose is low.  Artificially sweetened, or diet drinks, typically do not increase your blood glucose if they have zero calories in them. Read labels of beverages, as some diet drinks do have carbohydrate and will raise your blood glucose. Label Reading Tips Read Nutrition Facts labels to find out how many grams of carbohydrate are in a food you want to eat. Don't forget: sometimes serving sizes on the label aren't the same as how much food you are going to eat, so you may need to calculate how much carbohydrate is in the food you are serving yourself.   Carbohydrate Counting for People with Diabetes Sample 1-Day Menu  Breakfast  cup yogurt, low fat, low sugar (1 carbohydrate serving)   cup cereal, ready-to-eat, unsweetened (1 carbohydrate serving)  1 cup strawberries (1  carbohydrate serving)   cup almonds ( carbohydrate serving)  Lunch 1, 5 ounce can chunk light tuna  2 ounces cheese, low fat cheddar  6 whole wheat crackers (1 carbohydrate serving)  1 small apple (1 carbohydrate servings)   cup carrots ( carbohydrate serving)   cup snap peas  1 cup 1% milk (1 carbohydrate serving)   Evening Meal Stir fry made with: 3 ounces chicken  1 cup brown rice (3 carbohydrate servings)   cup broccoli ( carbohydrate serving)   cup green beans   cup onions  1 tablespoon olive oil  2 tablespoons teriyaki sauce ( carbohydrate serving)  Evening Snack 1 extra small banana (1 carbohydrate serving)  1 tablespoon peanut butter   Carbohydrate Counting for People with Diabetes Vegan Sample 1-Day Menu  Breakfast 1 cup cooked oatmeal (2 carbohydrate servings)   cup blueberries (1 carbohydrate serving)  2 tablespoons flaxseeds  1 cup soymilk fortified with calcium and vitamin D  1  cup coffee  Lunch 2 slices whole wheat bread (2 carbohydrate servings)   cup baked tofu   cup lettuce  2 slices tomato  2 slices avocado   cup baby carrots ( carbohydrate serving)  1 orange (1 carbohydrate serving)  1 cup soymilk fortified with calcium and vitamin D   Evening Meal Burrito made with: 1 6-inch corn tortilla (1 carbohydrate serving)  1 cup refried vegetarian beans (2 carbohydrate servings)   cup chopped tomatoes   cup lettuce   cup salsa  1/3 cup brown rice (1 carbohydrate serving)  1 tablespoon olive oil for rice   cup zucchini   Evening Snack 6 small whole grain crackers (1 carbohydrate serving)  2 apricots ( carbohydrate serving)   cup unsalted peanuts ( carbohydrate serving)    Carbohydrate Counting for People with Diabetes Vegetarian (Lacto-Ovo) Sample 1-Day Menu  Breakfast 1 cup cooked oatmeal (2 carbohydrate servings)   cup blueberries (1 carbohydrate serving)  2 tablespoons flaxseeds  1 egg  1 cup 1% milk (1 carbohydrate serving)  1  cup coffee  Lunch 2 slices whole wheat bread (2 carbohydrate servings)  2 ounces low-fat cheese   cup lettuce  2 slices tomato  2 slices avocado   cup baby carrots ( carbohydrate serving)  1 orange (1 carbohydrate serving)  1 cup unsweetened tea  Evening Meal Burrito made with: 1 6-inch corn tortilla (1 carbohydrate serving)   cup refried vegetarian beans (1 carbohydrate serving)   cup tomatoes   cup lettuce   cup salsa  1/3 cup brown rice (1 carbohydrate serving)  1 tablespoon olive oil for rice   cup zucchini  1 cup 1% milk (1 carbohydrate serving)  Evening Snack 6 small whole grain crackers (1 carbohydrate serving)  2 apricots ( carbohydrate serving)   cup unsalted peanuts ( carbohydrate serving)    Copyright 2020  [employer]. All rights reserved.  Using Nutrition Labels: Carbohydrate  . Serving Size  . Look at the serving size. All the information on the label is based on this portion. Jolyne Loa Per Container  . The number of servings contained in the package. . Guidelines for Carbohydrate  . Look at the total grams of carbohydrate in the serving size.  . 1 carbohydrate choice = 15 grams of carbohydrate. Range of Carbohydrate Grams Per Choice  Carbohydrate Grams/Choice Carbohydrate Choices  6-10   11-20 1  21-25 1  26-35 2  36-40 2  41-50 3  51-55 3  56-65 4  66-70 4  71-80 5    Copyright 2020  [employer]. All rights reserved.   Information on my medicine - ELIQUIS (apixaban)  This medication education was reviewed with me or my healthcare representative as part of my discharge preparation.  The pharmacist that spoke with me during my hospital stay was:  Ulyses Southward, RPH-CPP  Why was Eliquis prescribed for you? Eliquis was prescribed for you to reduce the risk of a blood clot forming that can cause a stroke if you have a medical condition called atrial fibrillation (a type of irregular  heartbeat).  What do You need to know about Eliquis ? Take your Eliquis TWICE DAILY - one tablet in the morning and one tablet in the evening with or without food. If you have difficulty swallowing the tablet whole please discuss with your pharmacist how to take the medication safely.  Take Eliquis exactly as prescribed by your doctor and DO NOT stop  taking Eliquis without talking to the doctor who prescribed the medication.  Stopping may increase your risk of developing a stroke.  Refill your prescription before you run out.  After discharge, you should have regular check-up appointments with your healthcare provider that is prescribing your Eliquis.  In the future your dose may need to be changed if your kidney function or weight changes by a significant amount or as you get older.  What do you do if you miss a dose? If you miss a dose, take it as soon as you remember on the same day and resume taking twice daily.  Do not take more than one dose of ELIQUIS at the same time to make up a missed dose.  Important Safety Information A possible side effect of Eliquis is bleeding. You should call your healthcare provider right away if you experience any of the following: ? Bleeding from an injury or your nose that does not stop. ? Unusual colored urine (red or dark brown) or unusual colored stools (red or black). ? Unusual bruising for unknown reasons. ? A serious fall or if you hit your head (even if there is no bleeding).  Some medicines may interact with Eliquis and might increase your risk of bleeding or clotting while on Eliquis. To help avoid this, consult your healthcare provider or pharmacist prior to using any new prescription or non-prescription medications, including herbals, vitamins, non-steroidal anti-inflammatory drugs (NSAIDs) and supplements.  This website has more information on Eliquis (apixaban): http://[email or web address].com/eliquis/home

## 2019-12-13 NOTE — Progress Notes (Signed)
Insulin aspart (novoLOG) injection 18 units TID with meals has been ordered for patient. Patient has not consumed any breakfast or lunch today. novoLOG 18 units has not been given.

## 2019-12-13 NOTE — Progress Notes (Addendum)
PROGRESS NOTE  Linda Richmond  LYY:[id number] DOB: 17-Aug-1965 DOA: 12/17/2019 PCP: Ignatius Specking, MD   Chief Complaint  Patient presents with  . Shortness of Breath   Brief Admission History:  54 y.o. female, with past medical history of anxiety, depression, diabetes, GERD, hypertension, vitamin D deficiency, negative rheumatoid arthritis, she presents to ED secondary to shortness of breath, reported began yesterday, patient reports for last week she started to have Covid symptoms including generalized body ache, headache, runny nose, nasal congestion, she was tested positive for COVID-19 on 8/27, prescribed Z-Pak and prednisone, she has finished both without much improvement, reports she started to have dyspnea yesterday, her son is admitted to [facility] due to COVID-19 as well, patient is unvaccinated, patient denies any chest pain, hemoptysis.  In ED patient saturating 86% on room air at rest, 83% on room air with activity, chest x-ray significant for multifocal opacities due to COVID-19 pneumonia, glucose was elevated at 310, triage hospitalist consulted to admit.  Assessment & Plan:   Active Problems:   Acute respiratory disease due to COVID-19 virus   Essential hypertension   Hypothyroid   Diabetes mellitus without complication (HCC)   GERD (gastroesophageal reflux disease)   Rheumatoid arthritis (HCC)  1. Acute respiratory failure with hypoxia secondary to Covid 19 infection - Pt has had further respiratory decompensation and hypoxic requiring at least 15 L/min supplemental oxygen. Briefly placed on NRB this AM and ordered for heated high flow.   She was started on baricitinib 9/5.  She is being transferred to progressive unit at [facility] when bed available.  2. Covid pneumonia - bilateral viral appearing infiltrates seen on chest xray with multifocal opacities noted. Inflammatory markers remain high.  3. Type 2 diabetes mellitus with steroid induced hyperglycemia - Increased  levemir 30 units twice daily plus novolog 18 units TIDAC plus resistant SSI coverage and CBG monitoring 5 times per day.  A1C 10.8% which is evidence of poorly controlled disease at baseline.   4. Essential hypertension - she has been resumed on home blood pressure medications.  5. GERD - protonix ordered for GI protection.  6. Rheumatoid arthritis - stable, follow up with outpatient rheumatologist in [place].  7. Leukopenia - from viral infection, follow.  8. Hypothyroidism - resumed home armour thyroid.   9. Positive blood culture - 1/2 positive for staph epidermidis which is presumably a contaminant.  10. Morbid obesity - BMI>40.    DVT prophylaxis:  lovenox  Code Status:  Full  Family Communication: daughter updated telephone 9/4, 9/5 Disposition: Transfer to progressive unit at [facility]  Status is: Inpatient  Remains inpatient appropriate because:IV treatments appropriate due to intensity of illness or inability to take PO and Inpatient level of care appropriate due to severity of illness  Dispo: The patient is from: Home              Anticipated d/c is to: Home              Anticipated d/c date is: > 3 days              Patient currently is not medically stable to d/c.  Consultants:   n/a  Procedures:   n/a  Antimicrobials:  Remdesivir 9/3>>   Subjective: Pt was getting to [facility] this morning and unfortunately sats dropped and unable to get them up, ended up on heated high flow Anna.     Objective: Vitals:   12/13/19 0309 12/13/19 0311 12/13/19  0554 12/13/19 0901  BP: 130/67     Pulse: 80 73 69   Resp: 20     Temp: 97.8 F (36.6 C)     TempSrc: Oral     SpO2: (!) 89% 90% 90% (!) 84%  Weight:      Height:        Intake/Output Summary (Last 24 hours) at 12/13/2019 1138 Last data filed at 12/12/2019 1500 Gross per 24 hour  Intake 88.94 ml  Output --  Net 88.94 ml   Filed Weights   01/01/2020 1248  Weight: 118.8 kg   Examination:  General exam: Pt is lying  in prone position.  Appears calm and comfortable, NAD.  Pt speaking in short sentences.  Respiratory system: rales heard posteriorly at bases unchanged. Moderate increased work of breathing. Cardiovascular system: normal S1 & S2 heard. No JVD, murmurs, rubs, gallops or clicks. No pedal edema. Gastrointestinal system: Abdomen is nondistended, soft and nontender. No organomegaly or masses felt. Normal bowel sounds heard. Central nervous system: Alert and oriented. No focal neurological deficits. Extremities: Symmetric 5 x 5 power. Skin: No rashes, lesions or ulcers.  Psychiatry: Judgement and insight appear normal. Mood & affect appropriate.   Data Reviewed: I have personally reviewed following labs and imaging studies  CBC: Recent Labs  Lab 12/22/2019 1330 12/11/19 0851 12/12/19 0755  WBC 2.8* 2.0* 2.6*  NEUTROABS 1.7 1.0* 1.4*  HGB 15.7* 15.3* 16.8*  HCT 48.1* 47.7* 53.0*  MCV 92.3 93.2 95.0  PLT 179 194 210    Basic Metabolic Panel: Recent Labs  Lab 12/16/2019 1330 12/11/19 0851 12/12/19 0755  NA 137 136 137  K 4.0 3.9 4.1  CL 102 102 104  CO2 22 21* 19*  GLUCOSE 310* 305* 297*  BUN 10 13 17   CREATININE 0.73 0.68 0.65  CALCIUM 8.1* 8.0* 8.4*    GFR: Estimated Creatinine Clearance: 96.2 mL/min (by C-G formula based on SCr of 0.65 mg/dL).  Liver Function Tests: Recent Labs  Lab 12/26/2019 1544 12/11/19 0851 12/12/19 0755  AST 44* 40 36  ALT 32 33 30  ALKPHOS 53 49 50  BILITOT 0.6 0.9 0.6  PROT 7.4 7.1 7.1  ALBUMIN 3.4* 3.2* 3.1*    CBG: Recent Labs  Lab 12/12/19 1210 12/12/19 1635 12/12/19 2136 12/13/19 0311 12/13/19 0758  GLUCAP 335* 289* 256* 290* 307*    Recent Results (from the past 240 hour(s))  SARS Coronavirus 2 by RT PCR (hospital order, performed in [facility] hospital lab) Nasopharyngeal Nasopharyngeal Swab     Status: Abnormal   Collection Time: 12/27/2019  3:32 PM   Specimen: Nasopharyngeal Swab  Result Value Ref Range Status   SARS  Coronavirus 2 POSITIVE (A) NEGATIVE Final    Comment: RESULT CALLED TO, READ BACK BY AND VERIFIED WITH: TALBOTT,T @ 1846 ON 01/03/2020 BY JUW (NOTE) SARS-CoV-2 target nucleic acids are DETECTED  SARS-CoV-2 RNA is generally detectable in upper respiratory specimens  during the acute phase of infection.  Positive results are indicative  of the presence of the identified virus, but do not rule out bacterial infection or co-infection with other pathogens not detected by the test.  Clinical correlation with patient history and  other diagnostic information is necessary to determine patient infection status.  The expected result is negative.  Fact Sheet for Patients:   BoilerBrush.com.cy   Fact Sheet for Healthcare Providers:   https://pope.com/    This test is not yet approved or cleared by the [place] FDA and  has been authorized for detection and/or diagnosis of SARS-CoV-2 by FDA under an Emergency Use Authorization (EUA).  This EUA will remain in effect (meaning this tes t can be used) for the duration of  the COVID-19 declaration under Section 564(b)(1) of the Act, 21 U.S.C. section 360-bbb-3(b)(1), unless the authorization is terminated or revoked sooner.  Performed at [facility], [address] [place], [place] [address]   Blood Culture (routine x 2)     Status: Abnormal   Collection Time: 12/19/2019  3:43 PM   Specimen: Right Antecubital; Blood  Result Value Ref Range Status   Specimen Description   Final    RIGHT ANTECUBITAL Performed at [facility], [address] [place], [place] [address]    Special Requests   Final    BOTTLES DRAWN AEROBIC AND ANAEROBIC Blood Culture adequate volume Performed at [facility], [address], [place], [place] [address]    Culture  Setup Time   Final    GRAM POSITIVE COCCI ANAEROBIC BOTTLE ONLY Gram Stain Report Called to,Read Back By and Verified With: BAYNES @ 1220 ON [id number] BY  HENDERSON L. CRITICAL RESULT CALLED TO, READ BACK BY AND VERIFIED WITH: RN BRANDY BAYNES 1836 [phone] FCP    Culture (A)  Final    STAPHYLOCOCCUS EPIDERMIDIS THE SIGNIFICANCE OF ISOLATING THIS ORGANISM FROM A SINGLE SET OF BLOOD CULTURES WHEN MULTIPLE SETS ARE DRAWN IS UNCERTAIN. PLEASE NOTIFY THE MICROBIOLOGY DEPARTMENT WITHIN ONE WEEK IF SPECIATION AND SENSITIVITIES ARE REQUIRED. Performed at [facility], [address] [place], [place] [address]    Report Status 12/13/2019 FINAL  Final  Blood Culture ID Panel (Reflexed)     Status: Abnormal   Collection Time: 12/23/2019  3:43 PM  Result Value Ref Range Status   Enterococcus faecalis NOT DETECTED NOT DETECTED Final   Enterococcus Faecium NOT DETECTED NOT DETECTED Final   Listeria monocytogenes NOT DETECTED NOT DETECTED Final   Staphylococcus species DETECTED (A) NOT DETECTED Final    Comment: CRITICAL RESULT CALLED TO, READ BACK BY AND VERIFIED WITH: RN BRANDY BAYNES 1836 [phone] FCP    Staphylococcus aureus (BCID) NOT DETECTED NOT DETECTED Final   Staphylococcus epidermidis DETECTED (A) NOT DETECTED Final    Comment: CRITICAL RESULT CALLED TO, READ BACK BY AND VERIFIED WITH: RN BRANDY BAYNES 1836 [phone] FCP    Staphylococcus lugdunensis NOT DETECTED NOT DETECTED Final   Streptococcus species NOT DETECTED NOT DETECTED Final   Streptococcus agalactiae NOT DETECTED NOT DETECTED Final   Streptococcus pneumoniae NOT DETECTED NOT DETECTED Final   Streptococcus pyogenes NOT DETECTED NOT DETECTED Final   A.calcoaceticus-baumannii NOT DETECTED NOT DETECTED Final   Bacteroides fragilis NOT DETECTED NOT DETECTED Final   Enterobacterales NOT DETECTED NOT DETECTED Final   Enterobacter cloacae complex NOT DETECTED NOT DETECTED Final   Escherichia coli NOT DETECTED NOT DETECTED Final   Klebsiella aerogenes NOT DETECTED NOT DETECTED Final   Klebsiella oxytoca NOT DETECTED NOT DETECTED Final   Klebsiella pneumoniae NOT DETECTED NOT DETECTED  Final   Proteus species NOT DETECTED NOT DETECTED Final   Salmonella species NOT DETECTED NOT DETECTED Final   Serratia marcescens NOT DETECTED NOT DETECTED Final   Haemophilus influenzae NOT DETECTED NOT DETECTED Final   Neisseria meningitidis NOT DETECTED NOT DETECTED Final   Pseudomonas aeruginosa NOT DETECTED NOT DETECTED Final   Stenotrophomonas maltophilia NOT DETECTED NOT DETECTED Final   Candida albicans NOT DETECTED NOT DETECTED Final   Candida auris NOT DETECTED NOT DETECTED Final  Candida glabrata NOT DETECTED NOT DETECTED Final   Candida krusei NOT DETECTED NOT DETECTED Final   Candida parapsilosis NOT DETECTED NOT DETECTED Final   Candida tropicalis NOT DETECTED NOT DETECTED Final   Cryptococcus neoformans/gattii NOT DETECTED NOT DETECTED Final   Methicillin resistance mecA/C NOT DETECTED NOT DETECTED Final    Comment: Performed at [facility], [address], [place], [place] [address]  Blood Culture (routine x 2)     Status: None (Preliminary result)   Collection Time: 12/22/2019  3:44 PM   Specimen: Left Antecubital; Blood  Result Value Ref Range Status   Specimen Description LEFT ANTECUBITAL  Final   Special Requests   Final    BOTTLES DRAWN AEROBIC ONLY Blood Culture results may not be optimal due to an inadequate volume of blood received in culture bottles   Culture   Final    NO GROWTH 2 DAYS Performed at [facility], [address] [place], [place] [address]    Report Status PENDING  Incomplete    Radiology Studies: No results found. Scheduled Meds: . baricitinib  4 mg Oral Daily  . enoxaparin (LOVENOX) injection  60 mg Subcutaneous Q24H  . insulin aspart  0-20 Units Subcutaneous TID WC  . insulin aspart  0-5 Units Subcutaneous QHS  . insulin aspart  18 Units Subcutaneous TID WC  . insulin detemir  30 Units Subcutaneous BID  . Ipratropium-Albuterol  1 puff Inhalation Q6H  . irbesartan  37.5 mg Oral Daily  . linagliptin  5 mg Oral Daily  .  methylPREDNISolone (SOLU-MEDROL) injection  50 mg Intravenous Q12H  . thyroid  30 mg Oral QAC breakfast  . Vitamin D (Ergocalciferol)  50,000 Units Oral Q7 days   Continuous Infusions: . sodium chloride 250 mL (12/12/19 0851)  . remdesivir 100 mg in NS 100 mL 100 mg (12/13/19 0928)    LOS: 3 days   Critical Care Procedure Note Authorized and Performed by: Maryln Manuel MD  Total Critical Care time:  37 mins Due to a high probability of clinically significant, life threatening deterioration, the patient required my highest level of preparedness to intervene emergently and I personally spent this critical care time directly and personally managing the patient.  This critical care time included obtaining a history; examining the patient, pulse oximetry; ordering and review of studies; arranging urgent treatment with development of a management plan; evaluation of patient's response of treatment; frequent reassessment; and discussions with other providers.  This critical care time was performed to assess and manage the high probability of imminent and life threatening deterioration that could result in multi-organ failure.  It was exclusive of separately billable procedures and treating other patients and teaching time.   Standley Dakins, MD How to contact the [facility] Attending or Consulting provider 7A - 7P or covering provider during after hours 7P -7A, for this patient?  1. Check the care team in [facility] and look for a) attending/consulting TRH provider listed and b) the [facility] team listed 2. Log into [email or web address] and use [employer]'s universal password to access. If you do not have the password, please contact the hospital operator. 3. Locate the [facility] provider you are looking for under [facility] and page to a number that you can be directly reached. 4. If you still have difficulty reaching the provider, please page the [facility] (Director on Call) for the Hospitalists listed on amion for  assistance.  12/13/2019, 11:38 AM

## 2019-12-13 NOTE — Progress Notes (Signed)
Nurse administered mdi. Patient in prone position on 15 liters oxygen. Saturation around 36 , patient states  she feels fine  No increase work of breathing noted. Extra flow meter with NRB mask added to room.if patient needs.

## 2019-12-13 NOTE — Progress Notes (Signed)
Per tele monitor, pt was sating 78%. Upon assessment pt had nasal cannula out. I educated pt on importance of keeping O2 in place. Pt sat 90% on 15L HFNC

## 2019-12-13 NOTE — Progress Notes (Signed)
Initial Nutrition Assessment  RD working remotely.  DOCUMENTATION CODES:   Morbid obesity  INTERVENTION:   -MVI with minerals daily -Glucerna Shake po BID, each supplement provides 220 kcal and 10 grams of protein -Ensure Max po daily, each supplement provides 150 kcal and 30 grams of protein -Provided "Carbohydrtae Counting for People with Diabetes" handout from [facility] Nutrition Care Manual; attached to AVS/ discharge instructions  NUTRITION DIAGNOSIS:   Increased nutrient needs related to acute illness (COVID-19) as evidenced by estimated needs.  GOAL:   Patient will meet greater than or equal to 90% of their needs  MONITOR:   PO intake, Supplement acceptance, Labs, Weight trends, Skin, I & O's  REASON FOR ASSESSMENT:   Malnutrition Screening Tool    ASSESSMENT:   Linda Richmond  is a 54 y.o. female, with past medical history of anxiety, depression, diabetes, GERD, hypertension, vitamin D deficiency, negative rheumatoid arthritis, she presents to ED secondary to shortness of breath, reported began yesterday, patient reports for last week she started to have Covid symptoms including generalized body ache, headache, runny nose, nasal congestion, she was tested positive for COVID-19 on 8/27, prescribed Z-Pak and prednisone, she has finished both without much improvement, reports she started to have dyspnea yesterday, her son is admitted to [facility] due to COVID-19 as well, patient is unvaccinated, patient denies any chest pain, hemoptysis.  Pt admitted with hypoxic respiratory failure due to COVID-19 pneumonia.   Reviewed I/O's: +89 ml x 24 hours and +1.2 L since admission  Attempted to speak with pt via call to hospital room phone, however, no answer.   Medications reviewed and include solu-medrol and remdesivir.   No meal completion data available to assess at this time. Given increased needs related to acute illness, pt would benefit from addition of oral  nutrition supplements.   Reviewed wt hx; wt has been stable over the past several years.   Lab Results  Component Value Date   HGBA1C 10.8 (H) 01/02/2020   PTA DM medications are none.   Labs reviewed: CBGS: 256-335 (inpatient orders for glycemic control are 5 mg tradjenta daily, 0-20 units insulin aspart TID with meals, 0-5 units insulin aspart daily at bedtime, 18 units insulin aspart TID with meal,s and 30 units insulin detemir BID).   Diet Order:   Diet Order            Diet Carb Modified Fluid consistency: Thin; Room service appropriate? Yes  Diet effective now                 EDUCATION NEEDS:   No education needs have been identified at this time  Skin:  Skin Assessment: Reviewed RN Assessment  Last BM:  12/11/19  Height:   Ht Readings from Last 1 Encounters:  12/30/2019 5\' 2"  (1.575 m)    Weight:   Wt Readings from Last 1 Encounters:  12/19/2019 118.8 kg    Ideal Body Weight:  50 kg  BMI:  Body mass index is 47.92 kg/m.  Estimated Nutritional Needs:   Kcal:  1800-2000  Protein:  110-125 grams  Fluid:  > 1.8 L    02/09/20, RD, LDN, CDCES Registered Dietitian II Certified Diabetes Care and Education Specialist Please refer to [facility] for RD and/or RD on-call/weekend/after hours pager

## 2019-12-14 ENCOUNTER — Inpatient Hospital Stay (HOSPITAL_COMMUNITY): Payer: 59

## 2019-12-14 LAB — COMPREHENSIVE METABOLIC PANEL
ALT: 34 U/L (ref 0–44)
AST: 39 U/L (ref 15–41)
Albumin: 2.7 g/dL — ABNORMAL LOW (ref 3.5–5.0)
Alkaline Phosphatase: 49 U/L (ref 38–126)
Anion gap: 13 (ref 5–15)
BUN: 18 mg/dL (ref 6–20)
CO2: 24 mmol/L (ref 22–32)
Calcium: 8.4 mg/dL — ABNORMAL LOW (ref 8.9–10.3)
Chloride: 104 mmol/L (ref 98–111)
Creatinine, Ser: 0.67 mg/dL (ref 0.44–1.00)
GFR calc Af Amer: >60 mL/min (ref >60)
GFR calc non Af Amer: >60 mL/min (ref >60)
Glucose, Bld: 280 mg/dL — ABNORMAL HIGH (ref 70–99)
Potassium: 3.7 mmol/L (ref 3.5–5.1)
Sodium: 141 mmol/L (ref 135–145)
Total Bilirubin: 0.7 mg/dL (ref 0.3–1.2)
Total Protein: 6.4 g/dL — ABNORMAL LOW (ref 6.5–8.1)

## 2019-12-14 LAB — CBC WITH DIFFERENTIAL/PLATELET
Abs Immature Granulocytes: 0.04 10*3/uL (ref 0.00–0.07)
Basophils Absolute: 0 10*3/uL (ref 0.0–0.1)
Basophils Relative: 0 %
Eosinophils Absolute: 0 10*3/uL (ref 0.0–0.5)
Eosinophils Relative: 0 %
HCT: 47.2 % — ABNORMAL HIGH (ref 36.0–46.0)
Hemoglobin: 15.6 g/dL — ABNORMAL HIGH (ref 12.0–15.0)
Immature Granulocytes: 1 %
Lymphocytes Relative: 10 %
Lymphs Abs: 0.6 10*3/uL — ABNORMAL LOW (ref 0.7–4.0)
MCH: 30.3 pg (ref 26.0–34.0)
MCHC: 33.1 g/dL (ref 30.0–36.0)
MCV: 91.7 fL (ref 80.0–100.0)
Monocytes Absolute: 0.5 10*3/uL (ref 0.1–1.0)
Monocytes Relative: 8 %
Neutro Abs: 5 10*3/uL (ref 1.7–7.7)
Neutrophils Relative %: 81 %
Platelets: 282 10*3/uL (ref 150–400)
RBC: 5.15 MIL/uL — ABNORMAL HIGH (ref 3.87–5.11)
RDW: 13 % (ref 11.5–15.5)
WBC: 6.2 10*3/uL (ref 4.0–10.5)
nRBC: 0 % (ref 0.0–0.2)

## 2019-12-14 LAB — D-DIMER, QUANTITATIVE: D-Dimer, Quant: <0.27 ug/mL-FEU (ref 0.00–0.50)

## 2019-12-14 LAB — GLUCOSE, CAPILLARY
Glucose-Capillary: 262 mg/dL — ABNORMAL HIGH (ref 70–99)
Glucose-Capillary: 280 mg/dL — ABNORMAL HIGH (ref 70–99)
Glucose-Capillary: 242 mg/dL — ABNORMAL HIGH (ref 70–99)
Glucose-Capillary: 283 mg/dL — ABNORMAL HIGH (ref 70–99)
Glucose-Capillary: 234 mg/dL — ABNORMAL HIGH (ref 70–99)

## 2019-12-14 LAB — C-REACTIVE PROTEIN: CRP: 3.6 mg/dL — ABNORMAL HIGH (ref <1.0)

## 2019-12-14 MED ORDER — LIVING WELL WITH DIABETES BOOK
Freq: Once | Status: AC
  Filled 2019-12-14: qty 1

## 2019-12-14 MED ORDER — INSULIN STARTER KIT- PEN NEEDLES (ENGLISH)
1.0000 | Freq: Once | Status: AC
  Administered 2019-12-14: 1
  Filled 2019-12-14 (×2): qty 1

## 2019-12-14 MED ORDER — INSULIN DETEMIR 100 UNIT/ML ~~LOC~~ SOLN
34.0000 [IU] | Freq: Two times a day (BID) | SUBCUTANEOUS | Status: DC
  Administered 2019-12-14 – 2019-12-16 (×5): 34 [IU] via SUBCUTANEOUS
  Filled 2019-12-14 (×6): qty 0.34

## 2019-12-14 NOTE — Evaluation (Signed)
Physical Therapy Evaluation Patient Details Name: Linda Richmond MRN: [id number] DOB: 10-Oct-1965 Today's Date: 12/14/2019   History of Present Illness  Pt adm to [facility] 12/28/2019 with acute hypoxic respiratory failure due to covid PNA. Transferred to [facility] on 12/13/19. PMH - DM, RA, morbid obesity.  Clinical Impression  Pt presents to PT with decr mobility due to poor functional activity tolerance and tenuous respiratory status. Expect mobility tolerance to improve if respiratory status improves.     Follow Up Recommendations No PT follow up    Equipment Recommendations  None recommended by PT    Recommendations for Other Services       Precautions / Restrictions Precautions Precautions: Other (comment) Precaution Comments: watch SpO2      Mobility  Bed Mobility               General bed mobility comments: Pt up in chair  Transfers Overall transfer level: Needs assistance Equipment used: None Transfers: Sit to/from Stand Sit to Stand: Supervision         General transfer comment: Assist for safety and lines  Ambulation/Gait Ambulation/Gait assistance: Min guard Gait Distance (Feet): 20 Feet (10' forward/10' back; Did 3 times) Assistive device: None Gait Pattern/deviations: Step-through pattern;Decreased stride length Gait velocity: decr Gait velocity interpretation: <1.31 ft/sec, indicative of household ambulator General Gait Details: Assist for Industrial/product designer    Modified Rankin (Stroke Patients Only)       Balance Overall balance assessment: Mild deficits observed, not formally tested                                           Pertinent Vitals/Pain Pain Assessment: No/denies pain    Home Living Family/patient expects to be discharged to:: Private residence Living Arrangements: Children Available Help at Discharge: Family Type of Home: House Home Access: Stairs to enter   Water quality scientist of Steps: 2 Home Layout: One level Home Equipment: None      Prior Function Level of Independence: Independent         Comments: Works in Animator        Extremity/Trunk Assessment   Upper Extremity Assessment Upper Extremity Assessment: Defer to OT evaluation    Lower Extremity Assessment Lower Extremity Assessment: Generalized weakness       Communication   Communication: No difficulties  Cognition Arousal/Alertness: Awake/alert Behavior During Therapy: WFL for tasks assessed/performed;Anxious Overall Cognitive Status: Within Functional Limits for tasks assessed                                        General Comments General comments (skin integrity, edema, etc.): Pt on 15L HFNC and 15L NRB with SpO2 84-86% with mobility. Recovers to >88% after 1-2 minutes.    Exercises General Exercises - Lower Extremity Ankle Circles/Pumps: AROM;Both;5 reps;Seated Quad Sets: AROM;Both;5 reps;Seated   Assessment/Plan    PT Assessment Patient needs continued PT services  PT Problem List Decreased strength;Decreased mobility;Cardiopulmonary status limiting activity;Obesity       PT Treatment Interventions DME instruction;Gait training;Functional mobility training;Therapeutic activities;Therapeutic exercise;Patient/family education    PT Goals (Current goals can be found in the Care Plan section)  Acute Rehab  PT Goals Patient Stated Goal: return home PT Goal Formulation: With patient Time For Goal Achievement: 12/28/19 Potential to Achieve Goals: Good    Frequency Min 3X/week   Barriers to discharge        Co-evaluation               AM-PAC PT "6 Clicks" Mobility  Outcome Measure Help needed turning from your back to your side while in a flat bed without using bedrails?: None Help needed moving from lying on your back to sitting on the side of a flat bed without using bedrails?: None Help needed  moving to and from a bed to a chair (including a wheelchair)?: A Little Help needed standing up from a chair using your arms (e.g., wheelchair or bedside chair)?: A Little Help needed to walk in hospital room?: A Little Help needed climbing 3-5 steps with a railing? : A Little 6 Click Score: 20    End of Session Equipment Utilized During Treatment: Oxygen Activity Tolerance: Treatment limited secondary to medical complications (Comment) (decr SpO2) Patient left: in chair;with call bell/phone within reach   PT Visit Diagnosis: Other abnormalities of gait and mobility (R26.89)    Time: 3329-5188 PT Time Calculation (min) (ACUTE ONLY): 28 min   Charges:   PT Evaluation $PT Eval Moderate Complexity: 1 Mod PT Treatments $Gait Training: 8-22 mins        [facility] PT Acute Rehabilitation Services Pager [phone] Office [phone]   Angelina Ok [facility] 12/14/2019, 1:44 PM

## 2019-12-14 NOTE — Evaluation (Signed)
Occupational Therapy Evaluation Patient Details Name: Linda Richmond MRN: [id number] DOB: [birth year] Today's Date: 12/14/2019    History of Present Illness Pt adm to [facility] 2019-12-29 with acute hypoxic respiratory failure due to covid PNA. Transferred to [facility] on 12/13/19. PMH - DM, RA, morbid obesity.   Clinical Impression   PTA, pt lives with family and reports Independence with ADLs, IADLs, mobility and driving. Pt received on 13 L HFNC, 15 L NRB mask with decreased cardiopulmonary tolerance as main limitation in ADL/mobility independence. Pt overall Supervision for transfers, short distance mobility without AD, as well as ADLs but limited by desats. Pt desats to 64% without NRB mask during ADLs seated at sink, sustained upper 70s during activity. However, pt recovers within 2 minutes to upper 80s%. Educated on energy conservation strategies to maximize SpO2 vitals and independence during ADLs/IADLs. Plan to provide handout of EC strategies during next session to maximize carryover.     Follow Up Recommendations  No OT follow up;Supervision - Intermittent    Equipment Recommendations  Tub/shower seat    Recommendations for Other Services       Precautions / Restrictions Precautions Precautions: Other (comment) Precaution Comments: watch SpO2 Restrictions Weight Bearing Restrictions: No      Mobility Bed Mobility Overal bed mobility: Modified Independent             General bed mobility comments: Mod I, no assist needed  Transfers Overall transfer level: Needs assistance Equipment used: None Transfers: Sit to/from [employer] Transfers Sit to Stand: Supervision Stand pivot transfers: Supervision       General transfer comment: Supervision, assist for lines    Balance Overall balance assessment: No apparent balance deficits (not formally assessed)                                         ADL either performed or assessed with clinical judgement   ADL  Overall ADL's : Needs assistance/impaired Eating/Feeding: Set up;Sitting   Grooming: Supervision/safety;Sitting;Oral care Grooming Details (indicate cue type and reason): Supervision for oral care with intermittent cueing for PLB and monitoring of O2 stats Upper Body Bathing: Supervision/ safety;Sitting   Lower Body Bathing: Supervison/ safety;Sit to/from stand   Upper Body Dressing : Supervision/safety;Sitting   Lower Body Dressing: Supervision/safety;Sit to/from stand   Toilet Transfer: Administrator, sports Details (indicate cue type and reason): Supervision for stand pivot to [facility] without AD, no physical assist needed Toileting- Clothing Manipulation and Hygiene: Supervision/safety;Sit to/from stand Toileting - Clothing Manipulation Details (indicate cue type and reason): Supervision for safety, no assist needed     Functional mobility during ADLs: Supervision/safety General ADL Comments: Pt with decreased cardiopulmonary tolerance as main limitation in ADL independence with high O2 demands     Vision Patient Visual Report: No change from baseline Vision Assessment?: No apparent visual deficits     Perception     Praxis      Pertinent Vitals/Pain Pain Assessment: No/denies pain     Hand Dominance Right   Extremity/Trunk Assessment Upper Extremity Assessment Upper Extremity Assessment: Generalized weakness   Lower Extremity Assessment Lower Extremity Assessment: Defer to PT evaluation   Cervical / Trunk Assessment Cervical / Trunk Assessment: Normal   Communication Communication Communication: No difficulties   Cognition Arousal/Alertness: Awake/alert Behavior During Therapy: WFL for tasks assessed/performed;Anxious Overall Cognitive Status: Within Functional Limits for tasks assessed  General Comments  Pt on 13 L HFNC and 15 L NRB mask stats at high 80s at rest. Desats to 64%  without mask brushing teeth seated at sink. Pt sustained in upper 70s during activity, recovers to high 80s within 2 minutes    Exercises Exercises: General Lower Extremity General Exercises - Lower Extremity Ankle Circles/Pumps: AROM;Both;5 reps;Seated Quad Sets: AROM;Both;5 reps;Seated   Shoulder Instructions      Home Living Family/patient expects to be discharged to:: Private residence Living Arrangements: Children Available Help at Discharge: Family Type of Home: House Home Access: Stairs to enter Secretary/administrator of Steps: 2   Home Layout: One level     Bathroom Shower/Tub: Walk-in shower;Tub only (uses tub)         Home Equipment: Walker - 2 wheels          Prior Functioning/Environment Level of Independence: Independent        Comments: Pt reports occasionally using RW when feet are hurting but typically does not need. Pt reports Independence with ADLs, IADLs and driving. Pt reports she enjoys cooking        OT Problem List: Decreased activity tolerance;Cardiopulmonary status limiting activity      OT Treatment/Interventions: Self-care/ADL training;Therapeutic exercise;Energy conservation;DME and/or AE instruction;Therapeutic activities;Patient/family education    OT Goals(Current goals can be found in the care plan section) Acute Rehab OT Goals Patient Stated Goal: return home OT Goal Formulation: With patient Time For Goal Achievement: 12/28/19 Potential to Achieve Goals: Good ADL Goals Pt Will Perform Grooming: with modified independence;standing Pt Will Transfer to Toilet: with modified independence;ambulating;regular height toilet Additional ADL Goal #1: Pt to demonstrate implementation of at least 3 energy conservation strategies during ADLs Additional ADL Goal #2: Pt to demonstrate ability to independently implement pursed lip breathing in order to maintain O2 >88%  OT Frequency: Min 2X/week   Barriers to D/C:             Co-evaluation              AM-PAC OT "6 Clicks" Daily Activity     Outcome Measure Help from another person eating meals?: A Little Help from another person taking care of personal grooming?: A Little Help from another person toileting, which includes using toliet, bedpan, or urinal?: A Little Help from another person bathing (including washing, rinsing, drying)?: A Little Help from another person to put on and taking off regular upper body clothing?: A Little Help from another person to put on and taking off regular lower body clothing?: A Little 6 Click Score: 18   End of Session Equipment Utilized During Treatment: Oxygen Nurse Communication: Mobility status;Other (comment) (O2)  Activity Tolerance: Patient tolerated treatment well Patient left: in bed;with call bell/phone within reach  OT Visit Diagnosis: Other (comment) (decreased cardiopulmonary tolerance)                Time: 1443-1510 OT Time Calculation (min): 27 min Charges:  OT General Charges $OT Visit: 1 Visit OT Evaluation $OT Eval Moderate Complexity: 1 Mod OT Treatments $Self Care/Home Management : 8-22 mins  Lorre Munroe, OTR/L  Lorre Munroe 12/14/2019, 3:28 PM

## 2019-12-14 NOTE — Progress Notes (Signed)
PROGRESS NOTE                                                                                                                                                                                                             Patient Demographics:    Linda Richmond, is a 54 y.o. female, DOB - 02/13/1966, ZOX:[id number]  Outpatient Primary MD for the patient is Ignatius Specking, MD   Admit date - 01-07-20   LOS - 4  Chief Complaint  Patient presents with   Shortness of Breath       Brief Narrative: Patient is a 54 y.o. female with PMHx of anxiety, depression, DM-2, HTN, seronegative RA-who tested positive for COVID-19 on 8/27-admitted to [facility] on 9/3 with acute hypoxemic respiratory failure secondary to COVID-19 pneumonia.  Unfortunately hospital course complicated by worsening hypoxemia requiring HFNC.  Transferred to [facility] for further evaluation and treatment.  See below for further details  COVID-19 vaccinated status: Unvaccinated  Significant Events: 8/27>> COVID-19 positive 9/3>> Admit to [facility] for hypoxemia secondary to COVID-19 9/6>> transferred to [facility]-worsening hypoxemia on 15 L of HFNC  Significant studies: 9/3>>Chest x-ray: New bilateral mid to lower lung patchy opacities  COVID-19 medications: Steroids: 9/3>> Remdesivir: 9/3>> 9/7 Baricitinib: 9/5  Antibiotics: None  Microbiology data: 9/20 >>blood culture: 1/2-Staph epidermidis (likely contaminant)  Procedures: None  Consults: None  DVT prophylaxis: SCDs Start: 2020/01/07 1527 Lovenox at prophylactic dosing    Subjective:    [name] today appears comfortable-she denies any chest pain.  Does not appear to be symptomatic at rest.   Assessment  & Plan :   Acute Hypoxic Resp Failure due to Covid 19 Viral pneumonia: Continues to have severe hypoxemia-but appears comfortable.  Remains on steroids/baricitinib/Remdesivir.  Watch  closely-if she deteriorates significantly-we will require ICU transfer.  Fever: afebrile O2 requirements:  SpO2: 90 % O2 Flow Rate (L/min): 13 L/min FiO2 (%): 100 %   COVID-19 Labs: Recent Labs    12/11/19 0851 12/12/19 0755 12/14/19 0522  DDIMER 0.41 0.32  --   CRP 12.4* 9.1* 3.6*    No results found for: BNP  Recent Labs  Lab 01-07-2020 1330  PROCALCITON <0.10    Lab Results  Component Value Date   SARSCOV2NAA POSITIVE (A) 01/07/2020   SARSCOV2NAA Not Detected 04/13/2019   SARSCOV2NAA Not  Detected 02/02/2019   SARSCOV2NAA Not Detected 01/22/2019      Prone/Incentive Spirometry: encouraged patient to lie prone for 3-4 hours at a time for a total of 16 hours a day, and to encourage incentive spirometry use 3-4/hour.  DM-2 (A1c 10.8) with steroid induced hypoglycemia: CBGs remain uncontrolled-increase Levemir to 24 units twice daily, remains on 18 units of Premeal NovoLog, continue resistant SSI.  Follow and adjust accordingly  Recent Labs    12/13/19 1607 12/13/19 2115 12/14/19 0252  GLUCAP 288* 308* 262*   HTN: Relatively well controlled-continue Avapro  Hypothyroidism: Continue thyroid Armour  GERD: Continue PPI  Positive blood culture-1/2+ for staph epidermidis-likely represents a contaminant-no treatment required.  Nutrition Problem: Nutrition Problem: Increased nutrient needs Etiology: acute illness (COVID-19) Signs/Symptoms: estimated needs Interventions: Glucerna shake, MVI, Premier Protein  Morbid Obesity: Estimated body mass index is 47.92 kg/m as calculated from the following:   Height as of this encounter: 5\' 2"  (1.575 m).   Weight as of this encounter: 118.8 kg.    ABG: No results found for: PHART, PCO2ART, PO2ART, HCO3, TCO2, ACIDBASEDEF, O2SAT  Vent Settings:  FiO2 (%):  [100 %] 100 %  Condition - Extremely Guarded  Family Communication  :  Daughter Marchelle Folks [phone]) updated over the phone 9/7  Code Status :  Full  Code  Diet :  Diet Order            Diet Carb Modified Fluid consistency: Thin; Room service appropriate? Yes  Diet effective now                  Disposition Plan  :   Status is: Inpatient  Remains inpatient appropriate because:Inpatient level of care appropriate due to severity of illness  Dispo: The patient is from: Home              Anticipated d/c is to: Home              Anticipated d/c date is: > 3 days              Patient currently is not medically stable to d/c.   Barriers to discharge: Hypoxia requiring O2 supplementation/complete 5 days of IV Remdesivir  Antimicorbials  :    Anti-infectives (From admission, onward)   Start     Dose/Rate Route Frequency Ordered Stop   12/11/19 1000  remdesivir 100 mg in sodium chloride 0.9 % 100 mL IVPB  Status:  Discontinued       "Followed by" Linked Group Details   100 mg 200 mL/hr over 30 Minutes Intravenous Daily 12/11/2019 1529 01/06/2020 1532   12/11/19 1000  remdesivir 100 mg in sodium chloride 0.9 % 100 mL IVPB        100 mg 200 mL/hr over 30 Minutes Intravenous Daily 01/02/2020 1533 12/15/19 0959   12/11/2019 1545  remdesivir 100 mg in sodium chloride 0.9 % 100 mL IVPB        100 mg 200 mL/hr over 30 Minutes Intravenous Every 30 min 12/17/2019 1533 12/31/2019 1850   12/15/2019 1530  remdesivir 200 mg in sodium chloride 0.9% 250 mL IVPB  Status:  Discontinued       "Followed by" Linked Group Details   200 mg 580 mL/hr over 30 Minutes Intravenous Once 12/23/2019 1529 12/16/2019 1532      Inpatient Medications  Scheduled Meds:  baricitinib  4 mg Oral Daily   enoxaparin (LOVENOX) injection  60 mg Subcutaneous Q24H   insulin aspart  0-20 Units Subcutaneous TID WC   insulin aspart  0-5 Units Subcutaneous QHS   insulin aspart  18 Units Subcutaneous TID WC   insulin detemir  30 Units Subcutaneous BID   Ipratropium-Albuterol  1 puff Inhalation Q6H   irbesartan  37.5 mg Oral Daily   linagliptin  5 mg Oral Daily    methylPREDNISolone (SOLU-MEDROL) injection  50 mg Intravenous Q12H   thyroid  30 mg Oral QAC breakfast   Vitamin D (Ergocalciferol)  50,000 Units Oral Q7 days   Continuous Infusions:  sodium chloride 250 mL (12/12/19 0851)   remdesivir 100 mg in NS 100 mL Stopped (12/13/19 2127)   PRN Meds:.sodium chloride, acetaminophen, ALPRAZolam, chlorpheniramine-HYDROcodone, guaiFENesin-dextromethorphan, pantoprazole   Time Spent in minutes 35   See all Orders from today for further details   Jeoffrey Massed M.D on 12/14/2019 at 7:05 AM  To page go to [email or web address] - use universal password  [facility] -  Office  [phone]    Objective:   Vitals:   12/13/19 2008 12/14/19 0000 12/14/19 0008 12/14/19 0418  BP: (!) 152/86 (!) 163/99 (!) 167/74 (!) 146/75  Pulse: 81 70 77 60  Resp: 20  20 16   Temp: 98 F (36.7 C)  97.8 F (36.6 C) 98.5 F (36.9 C)  TempSrc: Axillary  Axillary Axillary  SpO2: 92% 93% 91% 90%  Weight:      Height:        Wt Readings from Last 3 Encounters:  12/24/2019 118.8 kg  10/25/16 121.1 kg  08/23/16 126.6 kg     Intake/Output Summary (Last 24 hours) at 12/14/2019 0705 Last data filed at 12/14/2019 0421 Gross per 24 hour  Intake 447.6 ml  Output 600 ml  Net -152.4 ml     Physical Exam Gen Exam:Alert awake-not in any distress HEENT:atraumatic, normocephalic Chest: B/L clear to auscultation anteriorly CVS:S1S2 regular Abdomen:soft non tender, non distended Extremities:no edema Neurology: Non focal Skin: no rash   Data Review:    CBC Recent Labs  Lab 01/05/2020 1330 12/11/19 0851 12/12/19 0755 12/14/19 0522  WBC 2.8* 2.0* 2.6* 6.2  HGB 15.7* 15.3* 16.8* 15.6*  HCT 48.1* 47.7* 53.0* 47.2*  PLT 179 194 210 282  MCV 92.3 93.2 95.0 91.7  MCH 30.1 29.9 30.1 30.3  MCHC 32.6 32.1 31.7 33.1  RDW 13.4 13.3 13.2 13.0  LYMPHSABS 0.9 0.6* 0.8 0.6*  MONOABS 0.2 0.3 0.3 0.5  EOSABS 0.0 0.0 0.0 0.0  BASOSABS 0.0 0.0 0.0 0.0    Chemistries   Recent Labs  Lab 01/02/2020 1330 12/19/2019 1544 12/11/19 0851 12/12/19 0755 12/14/19 0522  NA 137  --  136 137 141  K 4.0  --  3.9 4.1 3.7  CL 102  --  102 104 104  CO2 22  --  21* 19* 24  GLUCOSE 310*  --  305* 297* 280*  BUN 10  --  13 17 18   CREATININE 0.73  --  0.68 0.65 0.67  CALCIUM 8.1*  --  8.0* 8.4* 8.4*  AST  --  44* 40 36 39  ALT  --  32 33 30 34  ALKPHOS  --  53 49 50 49  BILITOT  --  0.6 0.9 0.6 0.7   ------------------------------------------------------------------------------------------------------------------ No results for input(s): CHOL, HDL, LDLCALC, TRIG, CHOLHDL, LDLDIRECT in the last 72 hours.  Lab Results  Component Value Date   HGBA1C 10.8 (H) 12/22/2019   ------------------------------------------------------------------------------------------------------------------ No results for input(s): TSH, T4TOTAL, T3FREE, THYROIDAB in the last 72  hours.  Invalid input(s): FREET3 ------------------------------------------------------------------------------------------------------------------ No results for input(s): VITAMINB12, FOLATE, FERRITIN, TIBC, IRON, RETICCTPCT in the last 72 hours.  Coagulation profile No results for input(s): INR, PROTIME in the last 168 hours.  Recent Labs    12/11/19 0851 12/12/19 0755  DDIMER 0.41 0.32    Cardiac Enzymes No results for input(s): CKMB, TROPONINI, MYOGLOBIN in the last 168 hours.  Invalid input(s): CK ------------------------------------------------------------------------------------------------------------------ No results found for: BNP  Micro Results Recent Results (from the past 240 hour(s))  SARS Coronavirus 2 by RT PCR (hospital order, performed in [facility] hospital lab) Nasopharyngeal Nasopharyngeal Swab     Status: Abnormal   Collection Time: 12-30-2019  3:32 PM   Specimen: Nasopharyngeal Swab  Result Value Ref Range Status   SARS Coronavirus 2 POSITIVE (A) NEGATIVE Final    Comment:  RESULT CALLED TO, READ BACK BY AND VERIFIED WITH: TALBOTT,T @ 1846 ON 12/30/2019 BY JUW (NOTE) SARS-CoV-2 target nucleic acids are DETECTED  SARS-CoV-2 RNA is generally detectable in upper respiratory specimens  during the acute phase of infection.  Positive results are indicative  of the presence of the identified virus, but do not rule out bacterial infection or co-infection with other pathogens not detected by the test.  Clinical correlation with patient history and  other diagnostic information is necessary to determine patient infection status.  The expected result is negative.  Fact Sheet for Patients:   BoilerBrush.com.cy   Fact Sheet for Healthcare Providers:   https://pope.com/    This test is not yet approved or cleared by the [place] FDA and  has been authorized for detection and/or diagnosis of SARS-CoV-2 by FDA under an Emergency Use Authorization (EUA).  This EUA will remain in effect (meaning this tes t can be used) for the duration of  the COVID-19 declaration under Section 564(b)(1) of the Act, 21 U.S.C. section 360-bbb-3(b)(1), unless the authorization is terminated or revoked sooner.  Performed at [facility], [address], [place], [place] [address]   Blood Culture (routine x 2)     Status: Abnormal   Collection Time: 12-30-19  3:43 PM   Specimen: Right Antecubital; Blood  Result Value Ref Range Status   Specimen Description   Final    RIGHT ANTECUBITAL Performed at [facility], [address] [place], [place] [address]    Special Requests   Final    BOTTLES DRAWN AEROBIC AND ANAEROBIC Blood Culture adequate volume Performed at [facility], [address] [place], [place] [address]    Culture  Setup Time   Final    GRAM POSITIVE COCCI ANAEROBIC BOTTLE ONLY Gram Stain Report Called to,Read Back By and Verified With: BAYNES @ 1220 ON [id number] BY HENDERSON L. CRITICAL RESULT CALLED TO, READ BACK BY AND  VERIFIED WITH: RN BRANDY BAYNES 1836 [phone] FCP    Culture (A)  Final    STAPHYLOCOCCUS EPIDERMIDIS THE SIGNIFICANCE OF ISOLATING THIS ORGANISM FROM A SINGLE SET OF BLOOD CULTURES WHEN MULTIPLE SETS ARE DRAWN IS UNCERTAIN. PLEASE NOTIFY THE MICROBIOLOGY DEPARTMENT WITHIN ONE WEEK IF SPECIATION AND SENSITIVITIES ARE REQUIRED. Performed at [facility], [address] [place], [place] [address]    Report Status 12/13/2019 FINAL  Final  Blood Culture ID Panel (Reflexed)     Status: Abnormal   Collection Time: December 30, 2019  3:43 PM  Result Value Ref Range Status   Enterococcus faecalis NOT DETECTED NOT DETECTED Final   Enterococcus Faecium NOT DETECTED NOT DETECTED Final   Listeria monocytogenes NOT  DETECTED NOT DETECTED Final   Staphylococcus species DETECTED (A) NOT DETECTED Final    Comment: CRITICAL RESULT CALLED TO, READ BACK BY AND VERIFIED WITH: RN BRANDY BAYNES 1836 [phone] FCP    Staphylococcus aureus (BCID) NOT DETECTED NOT DETECTED Final   Staphylococcus epidermidis DETECTED (A) NOT DETECTED Final    Comment: CRITICAL RESULT CALLED TO, READ BACK BY AND VERIFIED WITH: RN BRANDY BAYNES 1836 [phone] FCP    Staphylococcus lugdunensis NOT DETECTED NOT DETECTED Final   Streptococcus species NOT DETECTED NOT DETECTED Final   Streptococcus agalactiae NOT DETECTED NOT DETECTED Final   Streptococcus pneumoniae NOT DETECTED NOT DETECTED Final   Streptococcus pyogenes NOT DETECTED NOT DETECTED Final   A.calcoaceticus-baumannii NOT DETECTED NOT DETECTED Final   Bacteroides fragilis NOT DETECTED NOT DETECTED Final   Enterobacterales NOT DETECTED NOT DETECTED Final   Enterobacter cloacae complex NOT DETECTED NOT DETECTED Final   Escherichia coli NOT DETECTED NOT DETECTED Final   Klebsiella aerogenes NOT DETECTED NOT DETECTED Final   Klebsiella oxytoca NOT DETECTED NOT DETECTED Final   Klebsiella pneumoniae NOT DETECTED NOT DETECTED Final   Proteus species NOT DETECTED NOT DETECTED Final     Salmonella species NOT DETECTED NOT DETECTED Final   Serratia marcescens NOT DETECTED NOT DETECTED Final   Haemophilus influenzae NOT DETECTED NOT DETECTED Final   Neisseria meningitidis NOT DETECTED NOT DETECTED Final   Pseudomonas aeruginosa NOT DETECTED NOT DETECTED Final   Stenotrophomonas maltophilia NOT DETECTED NOT DETECTED Final   Candida albicans NOT DETECTED NOT DETECTED Final   Candida auris NOT DETECTED NOT DETECTED Final   Candida glabrata NOT DETECTED NOT DETECTED Final   Candida krusei NOT DETECTED NOT DETECTED Final   Candida parapsilosis NOT DETECTED NOT DETECTED Final   Candida tropicalis NOT DETECTED NOT DETECTED Final   Cryptococcus neoformans/gattii NOT DETECTED NOT DETECTED Final   Methicillin resistance mecA/C NOT DETECTED NOT DETECTED Final    Comment: Performed at [facility], [address], [place], [place] [address]  Blood Culture (routine x 2)     Status: None (Preliminary result)   Collection Time: Jan 04, 2020  3:44 PM   Specimen: Left Antecubital; Blood  Result Value Ref Range Status   Specimen Description LEFT ANTECUBITAL  Final   Special Requests   Final    BOTTLES DRAWN AEROBIC ONLY Blood Culture results may not be optimal due to an inadequate volume of blood received in culture bottles   Culture   Final    NO GROWTH 3 DAYS Performed at [facility], [address], [place], [place] [address]    Report Status PENDING  Incomplete    Radiology Reports DG Chest Port 1 View  Result Date: 01-04-20 CLINICAL DATA:  COVID positive EXAM: PORTABLE CHEST 1 VIEW COMPARISON:  04/19/2019 chest radiograph and prior. FINDINGS: Hypoinflated lungs. Bilateral mid to lower lung patchy opacities, new since prior exam. No pneumothorax. Trace bilateral effusions. Cardiomediastinal silhouette within normal limits. IMPRESSION: New bilateral mid to lower lung patchy opacities. Hypoinflated lungs. Electronically Signed   By: Stana Bunting M.D.   On: 01-04-20  13:57

## 2019-12-14 NOTE — Progress Notes (Addendum)
Inpatient Diabetes Program Recommendations  AACE/ADA: New Consensus Statement on Inpatient Glycemic Control (2015)  Target Ranges:  Prepandial:   less than 140 mg/dL      Peak postprandial:   less than 180 mg/dL (1-2 hours)      Critically ill patients:  140 - 180 mg/dL   Lab Results  Component Value Date   GLUCAP 242 (H) 12/14/2019   HGBA1C 10.8 (H) 12/26/2019    Review of Glycemic Control  Diabetes history: DM 2 Outpatient Diabetes medications: none listed/pt reports metformin Current orders for Inpatient glycemic control:  Levemir 34 units bid Novolog 0-20 units tid + hs Novolog 18 units tid mc Tradjenta 5 mg Daily  A1c 10.8% on 9/3 Solumedrol 50 mg Q12 hours [employer] PCP: Dr. Karis Juba with pt over the phone regarding A1c and glucose control at home. Pt was prescribed steroids and finished course prior to A1c being drawn. Discussed A1c level of 10.8%. Pt sees a Publishing rights manager with Dr. Joya Martyr for DM. Last A1c last month was 10.1% and was started on metformin. Pt did not take metformin while having covid and unable to eat (exacerbating GI symptoms). Discussed current A1c. Discussed glucose and A1c goals. Mentioned to pt that each oral med can only lower the A1c 1-2% and metformin was not enough at baseline prior to her contracting COVID. Discussed possible need for insulin at time of d/c. Sent videos on how to operate insulin pen and what to do for hypoglycemia to her personal email account. Also emailed link to Lantus copay card for insulin at time of d/c.   Discussed with pt the needs for glucose checks when going home while on potential steroids.  Lantus solostar insulin pen (emailed copay card link for pt) order # [id number] Insulin pen needles order # [id number]   Thanks,  Christena Deem RN, MSN, BC-ADM Inpatient Diabetes Coordinator Team Pager [phone] (8a-5p)

## 2019-12-15 DIAGNOSIS — J80 Acute respiratory distress syndrome: Secondary | ICD-10-CM | POA: Diagnosis not present

## 2019-12-15 DIAGNOSIS — Z66 Do not resuscitate: Secondary | ICD-10-CM | POA: Diagnosis not present

## 2019-12-15 DIAGNOSIS — J1282 Pneumonia due to Coronavirus disease 2019: Secondary | ICD-10-CM | POA: Diagnosis not present

## 2019-12-15 LAB — CBC WITH DIFFERENTIAL/PLATELET
Abs Immature Granulocytes: 0.05 10*3/uL (ref 0.00–0.07)
Basophils Absolute: 0 10*3/uL (ref 0.0–0.1)
Basophils Relative: 0 %
Eosinophils Absolute: 0 10*3/uL (ref 0.0–0.5)
Eosinophils Relative: 0 %
HCT: 48 % — ABNORMAL HIGH (ref 36.0–46.0)
Hemoglobin: 15.3 g/dL — ABNORMAL HIGH (ref 12.0–15.0)
Immature Granulocytes: 1 %
Lymphocytes Relative: 13 %
Lymphs Abs: 0.6 10*3/uL — ABNORMAL LOW (ref 0.7–4.0)
MCH: 29.4 pg (ref 26.0–34.0)
MCHC: 31.9 g/dL (ref 30.0–36.0)
MCV: 92.1 fL (ref 80.0–100.0)
Monocytes Absolute: 0.3 10*3/uL (ref 0.1–1.0)
Monocytes Relative: 8 %
Neutro Abs: 3.6 10*3/uL (ref 1.7–7.7)
Neutrophils Relative %: 78 %
Platelets: 268 10*3/uL (ref 150–400)
RBC: 5.21 MIL/uL — ABNORMAL HIGH (ref 3.87–5.11)
RDW: 12.9 % (ref 11.5–15.5)
WBC: 4.6 10*3/uL (ref 4.0–10.5)
nRBC: 0 % (ref 0.0–0.2)

## 2019-12-15 LAB — COMPREHENSIVE METABOLIC PANEL
ALT: 35 U/L (ref 0–44)
AST: 37 U/L (ref 15–41)
Albumin: 2.7 g/dL — ABNORMAL LOW (ref 3.5–5.0)
Alkaline Phosphatase: 46 U/L (ref 38–126)
Anion gap: 13 (ref 5–15)
BUN: 20 mg/dL (ref 6–20)
CO2: 22 mmol/L (ref 22–32)
Calcium: 8.3 mg/dL — ABNORMAL LOW (ref 8.9–10.3)
Chloride: 108 mmol/L (ref 98–111)
Creatinine, Ser: 0.68 mg/dL (ref 0.44–1.00)
GFR calc Af Amer: >60 mL/min (ref >60)
GFR calc non Af Amer: >60 mL/min (ref >60)
Glucose, Bld: 159 mg/dL — ABNORMAL HIGH (ref 70–99)
Potassium: 4.5 mmol/L (ref 3.5–5.1)
Sodium: 143 mmol/L (ref 135–145)
Total Bilirubin: 0.7 mg/dL (ref 0.3–1.2)
Total Protein: 6.3 g/dL — ABNORMAL LOW (ref 6.5–8.1)

## 2019-12-15 LAB — GLUCOSE, CAPILLARY
Glucose-Capillary: 137 mg/dL — ABNORMAL HIGH (ref 70–99)
Glucose-Capillary: 171 mg/dL — ABNORMAL HIGH (ref 70–99)
Glucose-Capillary: 232 mg/dL — ABNORMAL HIGH (ref 70–99)
Glucose-Capillary: 270 mg/dL — ABNORMAL HIGH (ref 70–99)
Glucose-Capillary: 307 mg/dL — ABNORMAL HIGH (ref 70–99)
Glucose-Capillary: 301 mg/dL — ABNORMAL HIGH (ref 70–99)

## 2019-12-15 LAB — CULTURE, BLOOD (ROUTINE X 2): Culture: NO GROWTH

## 2019-12-15 LAB — D-DIMER, QUANTITATIVE: D-Dimer, Quant: 0.37 ug/mL-FEU (ref 0.00–0.50)

## 2019-12-15 LAB — C-REACTIVE PROTEIN: CRP: 2.1 mg/dL — ABNORMAL HIGH (ref <1.0)

## 2019-12-15 LAB — BRAIN NATRIURETIC PEPTIDE: B Natriuretic Peptide: 20.9 pg/mL (ref 0.0–100.0)

## 2019-12-15 MED ORDER — FUROSEMIDE 10 MG/ML IJ SOLN
40.0000 mg | Freq: Once | INTRAMUSCULAR | Status: AC
  Administered 2019-12-15: 40 mg via INTRAVENOUS
  Filled 2019-12-15: qty 4

## 2019-12-15 MED ORDER — ENOXAPARIN SODIUM 120 MG/0.8ML ~~LOC~~ SOLN
120.0000 mg | Freq: Two times a day (BID) | SUBCUTANEOUS | Status: DC
  Administered 2019-12-16 – 2019-12-19 (×9): 120 mg via SUBCUTANEOUS
  Filled 2019-12-15 (×10): qty 0.8

## 2019-12-15 MED ORDER — METOPROLOL TARTRATE 5 MG/5ML IV SOLN
5.0000 mg | Freq: Once | INTRAVENOUS | Status: AC
  Administered 2019-12-15: 5 mg via INTRAVENOUS
  Filled 2019-12-15: qty 5

## 2019-12-15 NOTE — Progress Notes (Signed)
ANTICOAGULATION CONSULT NOTE - Initial Consult  Pharmacy Consult for Lovenox  Indication: atrial fibrillation  Allergies  Allergen Reactions   Ancef [Cefazolin] Hives   Aspirin Other (See Comments)    GI Bleed   Doxycycline Diarrhea and Nausea And Vomiting   Sulfa Antibiotics Hives    Possible loss of consciousness   Ciprofloxacin Rash    "feels like bugs are crawling in my head"   Lesinurad-Allopurinol Nausea And Vomiting   Penicillins Rash    Patient Measurements: Height: 5\' 2"  (157.5 cm) Weight: 118.8 kg (262 lb) IBW/kg (Calculated) : 50.1  Vital Signs: Temp: 98.3 F (36.8 C) (09/08 2250) Temp Source: Axillary (09/08 2250) BP: 150/78 (09/08 2250) Pulse Rate: 112 (09/08 2250)  Labs: Recent Labs    12/14/19 0522 12/15/19 0555  HGB 15.6* 15.3*  HCT 47.2* 48.0*  PLT 282 268  CREATININE 0.67 0.68    Estimated Creatinine Clearance: 96.2 mL/min (by C-G formula based on SCr of 0.68 mg/dL).   Medical History: Past Medical History:  Diagnosis Date   Anxiety    Depression    Diabetes mellitus without complication (HCC)    GERD (gastroesophageal reflux disease)    Hypertension    Hypothyroidism    Palpitations    Rheumatoid arthritis (HCC)     Medications:  Scheduled:   baricitinib  4 mg Oral Daily   enoxaparin (LOVENOX) injection  120 mg Subcutaneous Q12H   insulin aspart  0-20 Units Subcutaneous TID WC   insulin aspart  0-5 Units Subcutaneous QHS   insulin aspart  18 Units Subcutaneous TID WC   insulin detemir  34 Units Subcutaneous BID   Ipratropium-Albuterol  1 puff Inhalation Q6H   irbesartan  37.5 mg Oral Daily   linagliptin  5 mg Oral Daily   methylPREDNISolone (SOLU-MEDROL) injection  50 mg Intravenous Q12H   metoprolol tartrate  5 mg Intravenous Once   thyroid  30 mg Oral QAC breakfast   Vitamin D (Ergocalciferol)  50,000 Units Oral Q7 days    Assessment: 54 y.o. female with new onset Afib for heparin Goal of  Therapy:  Full anticoagulation with Lovenox Monitor platelets by anticoagulation protocol: Yes   Plan:  Increase Lovenox 120 mg SQ q12h as ordered  59 12/15/2019,11:47 PM

## 2019-12-15 NOTE — Progress Notes (Signed)
Unable to obtain morning lab work lab notified.

## 2019-12-15 NOTE — Progress Notes (Signed)
PROGRESS NOTE                                                                                                                                                                                                             Patient Demographics:    Linda Richmond, is a 54 y.o. female, DOB - 05-23-1965, EYC:[id number]  Outpatient Primary MD for the patient is Ignatius Specking, MD   Admit date - 12/17/2019   LOS - 5  Chief Complaint  Patient presents with  . Shortness of Breath       Brief Narrative: Patient is a 54 y.o. female with PMHx of anxiety, depression, DM-2, HTN, seronegative RA-who tested positive for COVID-19 on 8/27-admitted to [facility] on 9/3 with acute hypoxemic respiratory failure secondary to COVID-19 pneumonia.  Unfortunately hospital course complicated by worsening hypoxemia requiring HFNC.  Transferred to [facility] for further evaluation and treatment.  See below for further details  COVID-19 vaccinated status: Unvaccinated  Significant Events: 8/27>> COVID-19 positive 9/3>> Admit to [facility] for hypoxemia secondary to COVID-19 9/6>> transferred to [facility]-worsening hypoxemia on 15 L of HFNC  Significant studies: 9/3>>Chest x-ray: New bilateral mid to lower lung patchy opacities  COVID-19 medications: Steroids: 9/3>> Remdesivir: 9/3>> 9/7 Baricitinib: 9/5  Antibiotics: None  Microbiology data: 9/20 >>blood culture: 1/2-Staph epidermidis (likely contaminant)  Procedures: None  Consults: None  DVT prophylaxis: SCDs Start: 12/14/2019 1527 Lovenox at intermediate dosing    Subjective:   Had just moved from bed to chair-and was slightly tachypneic but otherwise no major issues overnight-remains on HFNC and NRB.   Assessment  & Plan :   Acute Hypoxic Resp Failure due to Covid 19 Viral pneumonia: Continues to have severe hypoxemia but appears comfortable-remains on steroids and  baricitinib-completed Remdesivir on 9/7.  Does have some mild trace lower extremity edema-we will give 1 dose of IV Lasix in an attempt to keep in negative balance as much as possible.  Continue to watch very closely-if she deteriorates significantly-we will require to transfer to the ICU.  Fever: afebrile O2 requirements:  SpO2: 90 % O2 Flow Rate (L/min): 13 L/min (+ 15L NRB) FiO2 (%): 94 %   COVID-19 Labs: Recent Labs    12/14/19 0522 12/15/19 0555  DDIMER <0.27 0.37  CRP 3.6* 2.1*       Component  Value Date/Time   BNP 20.9 12/15/2019 0555    Recent Labs  Lab December 23, 2019 1330  PROCALCITON <0.10    Lab Results  Component Value Date   SARSCOV2NAA POSITIVE (A) 12/23/19   SARSCOV2NAA Not Detected 04/13/2019   SARSCOV2NAA Not Detected 02/02/2019   SARSCOV2NAA Not Detected 01/22/2019      Prone/Incentive Spirometry: encouraged patient to lie prone for 3-4 hours at a time for a total of 16 hours a day, and to encourage incentive spirometry use 3-4/hour.  DM-2 (A1c 10.8) with steroid induced hypoglycemia: CBGs relatively stable this morning-insulin regimen was just adjusted yesterday-continue Levemir 34 units twice daily, 18 units of Premeal NovoLog and resistant SSI.  Follow and adjust.    Recent Labs    12/14/19 2112 12/15/19 0241 12/15/19 0724  GLUCAP 234* 137* 171*   HTN: BP controlled-continue Avapro  Hypothyroidism: Continue thyroid Armour  GERD: Continue PPI  Positive blood culture-1/2+ for staph epidermidis-likely represents a contaminant-no treatment required.  Nutrition Problem: Nutrition Problem: Increased nutrient needs Etiology: acute illness (COVID-19) Signs/Symptoms: estimated needs Interventions: Glucerna shake, MVI, Premier Protein  Morbid Obesity: Estimated body mass index is 47.92 kg/m as calculated from the following:   Height as of this encounter:  (1.575 m).   Weight as of this encounter: 118.8 kg.    ABG: No results found for:  PHART, PCO2ART, PO2ART, HCO3, TCO2, ACIDBASEDEF, O2SAT  Vent Settings:  FiO2 (%):  [94 %-100 %] 94 %  Condition - Extremely Guarded  Family Communication  :  Daughter Marchelle Folks [phone]) called on 9/8-however her brother Chrissie Noa answered the phone-the family was updated on 9/8.   Code Status :  Full Code  Diet :  Diet Order            Diet Carb Modified Fluid consistency: Thin; Room service appropriate? Yes  Diet effective now                  Disposition Plan  :   Status is: Inpatient  Remains inpatient appropriate because:Inpatient level of care appropriate due to severity of illness  Dispo: The patient is from: Home              Anticipated d/c is to: Home              Anticipated d/c date is: > 3 days              Patient currently is not medically stable to d/c.   Barriers to discharge: Hypoxia requiring O2 supplementation/complete 5 days of IV Remdesivir  Antimicorbials  :    Anti-infectives (From admission, onward)   Start     Dose/Rate Route Frequency Ordered Stop   12/11/19 1000  remdesivir 100 mg in sodium chloride 0.9 % 100 mL IVPB  Status:  Discontinued       "Followed by" Linked Group Details   100 mg 200 mL/hr over 30 Minutes Intravenous Daily 12-23-19 1529 2019/12/23 1532   12/11/19 1000  remdesivir 100 mg in sodium chloride 0.9 % 100 mL IVPB        100 mg 200 mL/hr over 30 Minutes Intravenous Daily 2019-12-23 1533 12/14/19 0907   2019/12/23 1545  remdesivir 100 mg in sodium chloride 0.9 % 100 mL IVPB        100 mg 200 mL/hr over 30 Minutes Intravenous Every 30 min December 23, 2019 1533 December 23, 2019 1850   23-Dec-2019 1530  remdesivir 200 mg in sodium chloride 0.9% 250 mL IVPB  Status:  Discontinued       "Followed by" Linked Group Details   200 mg 580 mL/hr over 30 Minutes Intravenous Once 12/23/2019 1529 12/09/2019 1532      Inpatient Medications  Scheduled Meds: . baricitinib  4 mg Oral Daily  . enoxaparin (LOVENOX) injection  60 mg Subcutaneous Q24H  . insulin  aspart  0-20 Units Subcutaneous TID WC  . insulin aspart  0-5 Units Subcutaneous QHS  . insulin aspart  18 Units Subcutaneous TID WC  . insulin detemir  34 Units Subcutaneous BID  . Ipratropium-Albuterol  1 puff Inhalation Q6H  . irbesartan  37.5 mg Oral Daily  . linagliptin  5 mg Oral Daily  . methylPREDNISolone (SOLU-MEDROL) injection  50 mg Intravenous Q12H  . thyroid  30 mg Oral QAC breakfast  . Vitamin D (Ergocalciferol)  50,000 Units Oral Q7 days   Continuous Infusions: . sodium chloride 250 mL (12/12/19 0851)   PRN Meds:.sodium chloride, acetaminophen, ALPRAZolam, chlorpheniramine-HYDROcodone, guaiFENesin-dextromethorphan, pantoprazole   Time Spent in minutes 35   See all Orders from today for further details   Jeoffrey Massed M.D on 12/15/2019 at 11:48 AM  To page go to [email or web address] - use universal password  [facility] -  Office  [phone]    Objective:   Vitals:   12/15/19 0339 12/15/19 0424 12/15/19 0729 12/15/19 0901  BP:  140/88 137/69   Pulse:  67  79  Resp:  Temp:  98.1 F (36.7 C) 98 F (36.7 C)   TempSrc:  Axillary Axillary   SpO2: 91% 90%    Weight:      Height:        Wt Readings from Last 3 Encounters:  12/19/2019 118.8 kg  10/25/16 121.1 kg  08/23/16 126.6 kg     Intake/Output Summary (Last 24 hours) at 12/15/2019 1148 Last data filed at 12/15/2019 0437 Gross per 24 hour  Intake 240 ml  Output --  Net 240 ml     Physical Exam Gen Exam:Alert awake-not in any distress HEENT:atraumatic, normocephalic Chest: B/L clear to auscultation anteriorly CVS:S1S2 regular Abdomen:soft non tender, non distended Extremities:no edema Neurology: Non focal Skin: no rash   Data Review:    CBC Recent Labs  Lab 12/20/2019 1330 12/11/19 0851 12/12/19 0755 12/14/19 0522 12/15/19 0555  WBC 2.8* 2.0* 2.6* 6.2 4.6  HGB 15.7* 15.3* 16.8* 15.6* 15.3*  HCT 48.1* 47.7* 53.0* 47.2* 48.0*  PLT 179 194 210 282 268  MCV 92.3 93.2 95.0  91.7 92.1  MCH 30.1 29.9 30.1 30.3 29.4  MCHC 32.6 32.1 31.7 33.1 31.9  RDW 13.4 13.3 13.2 13.0 12.9  LYMPHSABS 0.9 0.6* 0.8 0.6* 0.6*  MONOABS 0.2 0.3 0.3 0.5 0.3  EOSABS 0.0 0.0 0.0 0.0 0.0  BASOSABS 0.0 0.0 0.0 0.0 0.0    Chemistries  Recent Labs  Lab 12/29/2019 1330 01/04/2020 1544 12/11/19 0851 12/12/19 0755 12/14/19 0522 12/15/19 0555  NA 137  --  136 137 141 143  K 4.0  --  3.9 4.1 3.7 4.5  CL 102  --  102 104 104 108  CO2 22  --  21* 19* 24 22  GLUCOSE 310*  --  305* 297* 280* 159*  BUN 10  --  CREATININE 0.73  --  0.68 0.65 0.67 0.68  CALCIUM 8.1*  --  8.0* 8.4* 8.4* 8.3*  AST  --  44* 40 36 39 37  ALT  --  32 33 30 34  35  ALKPHOS  --  53 49 50 49 46  BILITOT  --  0.6 0.9 0.6 0.7 0.7   ------------------------------------------------------------------------------------------------------------------ No results for input(s): CHOL, HDL, LDLCALC, TRIG, CHOLHDL, LDLDIRECT in the last 72 hours.  Lab Results  Component Value Date   HGBA1C 10.8 (H) 12/21/2019   ------------------------------------------------------------------------------------------------------------------ No results for input(s): TSH, T4TOTAL, T3FREE, THYROIDAB in the last 72 hours.  Invalid input(s): FREET3 ------------------------------------------------------------------------------------------------------------------ No results for input(s): VITAMINB12, FOLATE, FERRITIN, TIBC, IRON, RETICCTPCT in the last 72 hours.  Coagulation profile No results for input(s): INR, PROTIME in the last 168 hours.  Recent Labs    12/14/19 0522 12/15/19 0555  DDIMER <0.27 0.37    Cardiac Enzymes No results for input(s): CKMB, TROPONINI, MYOGLOBIN in the last 168 hours.  Invalid input(s): CK ------------------------------------------------------------------------------------------------------------------    Component Value Date/Time   BNP 20.9 12/15/2019 0555    Micro Results Recent  Results (from the past 240 hour(s))  SARS Coronavirus 2 by RT PCR (hospital order, performed in [facility] hospital lab) Nasopharyngeal Nasopharyngeal Swab     Status: Abnormal   Collection Time: 12/21/19  3:32 PM   Specimen: Nasopharyngeal Swab  Result Value Ref Range Status   SARS Coronavirus 2 POSITIVE (A) NEGATIVE Final    Comment: RESULT CALLED TO, READ BACK BY AND VERIFIED WITH: TALBOTT,T @ 1846 ON Dec 21, 2019 BY JUW (NOTE) SARS-CoV-2 target nucleic acids are DETECTED  SARS-CoV-2 RNA is generally detectable in upper respiratory specimens  during the acute phase of infection.  Positive results are indicative  of the presence of the identified virus, but do not rule out bacterial infection or co-infection with other pathogens not detected by the test.  Clinical correlation with patient history and  other diagnostic information is necessary to determine patient infection status.  The expected result is negative.  Fact Sheet for Patients:   BoilerBrush.com.cy   Fact Sheet for Healthcare Providers:   https://pope.com/    This test is not yet approved or cleared by the [place] FDA and  has been authorized for detection and/or diagnosis of SARS-CoV-2 by FDA under an Emergency Use Authorization (EUA).  This EUA will remain in effect (meaning this tes t can be used) for the duration of  the COVID-19 declaration under Section 564(b)(1) of the Act, 21 U.S.C. section 360-bbb-3(b)(1), unless the authorization is terminated or revoked sooner.  Performed at [facility], [address] [place], [place] [address]   Blood Culture (routine x 2)     Status: Abnormal   Collection Time: 12/21/2019  3:43 PM   Specimen: Right Antecubital; Blood  Result Value Ref Range Status   Specimen Description   Final    RIGHT ANTECUBITAL Performed at [facility], [address], [place], [place] [address]    Special Requests   Final    BOTTLES DRAWN  AEROBIC AND ANAEROBIC Blood Culture adequate volume Performed at [facility], [address] [place], [place] [address]    Culture  Setup Time   Final    GRAM POSITIVE COCCI ANAEROBIC BOTTLE ONLY Gram Stain Report Called to,Read Back By and Verified With: BAYNES @ 1220 ON [id number] BY HENDERSON L. CRITICAL RESULT CALLED TO, READ BACK BY AND VERIFIED WITH: RN BRANDY BAYNES 1836 [phone] FCP    Culture (A)  Final    STAPHYLOCOCCUS EPIDERMIDIS THE SIGNIFICANCE OF ISOLATING THIS ORGANISM FROM A SINGLE SET OF BLOOD CULTURES WHEN MULTIPLE SETS ARE DRAWN IS UNCERTAIN. PLEASE NOTIFY THE MICROBIOLOGY DEPARTMENT WITHIN ONE WEEK IF  SPECIATION AND SENSITIVITIES ARE REQUIRED. Performed at [facility], [address], [place], [place] [address]    Report Status 12/13/2019 FINAL  Final  Blood Culture ID Panel (Reflexed)     Status: Abnormal   Collection Time: 12-27-2019  3:43 PM  Result Value Ref Range Status   Enterococcus faecalis NOT DETECTED NOT DETECTED Final   Enterococcus Faecium NOT DETECTED NOT DETECTED Final   Listeria monocytogenes NOT DETECTED NOT DETECTED Final   Staphylococcus species DETECTED (A) NOT DETECTED Final    Comment: CRITICAL RESULT CALLED TO, READ BACK BY AND VERIFIED WITH: RN BRANDY BAYNES 1836 [phone] FCP    Staphylococcus aureus (BCID) NOT DETECTED NOT DETECTED Final   Staphylococcus epidermidis DETECTED (A) NOT DETECTED Final    Comment: CRITICAL RESULT CALLED TO, READ BACK BY AND VERIFIED WITH: RN BRANDY BAYNES 1836 [phone] FCP    Staphylococcus lugdunensis NOT DETECTED NOT DETECTED Final   Streptococcus species NOT DETECTED NOT DETECTED Final   Streptococcus agalactiae NOT DETECTED NOT DETECTED Final   Streptococcus pneumoniae NOT DETECTED NOT DETECTED Final   Streptococcus pyogenes NOT DETECTED NOT DETECTED Final   A.calcoaceticus-baumannii NOT DETECTED NOT DETECTED Final   Bacteroides fragilis NOT DETECTED NOT DETECTED Final   Enterobacterales NOT DETECTED NOT  DETECTED Final   Enterobacter cloacae complex NOT DETECTED NOT DETECTED Final   Escherichia coli NOT DETECTED NOT DETECTED Final   Klebsiella aerogenes NOT DETECTED NOT DETECTED Final   Klebsiella oxytoca NOT DETECTED NOT DETECTED Final   Klebsiella pneumoniae NOT DETECTED NOT DETECTED Final   Proteus species NOT DETECTED NOT DETECTED Final   Salmonella species NOT DETECTED NOT DETECTED Final   Serratia marcescens NOT DETECTED NOT DETECTED Final   Haemophilus influenzae NOT DETECTED NOT DETECTED Final   Neisseria meningitidis NOT DETECTED NOT DETECTED Final   Pseudomonas aeruginosa NOT DETECTED NOT DETECTED Final   Stenotrophomonas maltophilia NOT DETECTED NOT DETECTED Final   Candida albicans NOT DETECTED NOT DETECTED Final   Candida auris NOT DETECTED NOT DETECTED Final   Candida glabrata NOT DETECTED NOT DETECTED Final   Candida krusei NOT DETECTED NOT DETECTED Final   Candida parapsilosis NOT DETECTED NOT DETECTED Final   Candida tropicalis NOT DETECTED NOT DETECTED Final   Cryptococcus neoformans/gattii NOT DETECTED NOT DETECTED Final   Methicillin resistance mecA/C NOT DETECTED NOT DETECTED Final    Comment: Performed at [facility], [address], [place], [place] [address]  Blood Culture (routine x 2)     Status: None   Collection Time: 27-Dec-2019  3:44 PM   Specimen: Left Antecubital; Blood  Result Value Ref Range Status   Specimen Description LEFT ANTECUBITAL  Final   Special Requests   Final    BOTTLES DRAWN AEROBIC ONLY Blood Culture results may not be optimal due to an inadequate volume of blood received in culture bottles   Culture   Final    NO GROWTH 5 DAYS Performed at [facility], [address], [place], [place] [address]    Report Status 12/15/2019 FINAL  Final    Radiology Reports DG Chest Port 1 View  Result Date: 12/14/2019 CLINICAL DATA:  Shortness of breath, COVID. EXAM: PORTABLE CHEST 1 VIEW COMPARISON:  Prior chest radiographs 12/27/2019 and  earlier. FINDINGS: Shallow inspiration radiograph. The cardiomediastinal silhouette is unchanged. Similar to the prior examination of 12-27-19, there are interstitial and ill-defined airspace opacities within the left greater than right mid to lower lung fields. No definite pleural effusion. No evidence  of pneumothorax. No acute bony abnormality identified. IMPRESSION: Shallow inspiration radiograph. Unchanged appearance of interstitial and ill-defined airspace opacities within the left greater than right mid-to-lower lung fields. These findings likely reflect multifocal pneumonia given the provided history of COVID positivity. Electronically Signed   By: Jackey Loge DO   On: 12/14/2019 08:20   DG Chest Port 1 View  Result Date: 12/22/2019 CLINICAL DATA:  COVID positive EXAM: PORTABLE CHEST 1 VIEW COMPARISON:  04/19/2019 chest radiograph and prior. FINDINGS: Hypoinflated lungs. Bilateral mid to lower lung patchy opacities, new since prior exam. No pneumothorax. Trace bilateral effusions. Cardiomediastinal silhouette within normal limits. IMPRESSION: New bilateral mid to lower lung patchy opacities. Hypoinflated lungs. Electronically Signed   By: Stana Bunting M.D.   On: 12/22/2019 13:57

## 2019-12-15 NOTE — Significant Event (Addendum)
HOSPITAL MEDICINE OVERNIGHT EVENT NOTE    Notified by nursing patient has suddenly gone into atrial fibrillation on telemetry, HR in the 160's.   Patient currently asymptomatic.  ECG ordered.  DDimer performed the AM of 9/8 is normal making PE unlikely.  TSH, Troponin ordered.  Echocardiogram ordered for the morning.   Will attempt rate control with 5mg  IV Metoprolol, redose as necessary based on response.    CHADSVASC score appears to be 3 (female, DM, HTN). Additionally increased Lovenox to 1mg /kg Q12hrs.    Daytime team to consider cardiology consult in the AM.   MD [facility]   ADDENDUM  Patient continuing to exhibit rapid atrial fibrillation despite 5mg  IV Metoprolol x 2.  TSH and Troponin unremarkable.  Will start IV Diltiazem infusion.    Alyssah Algeo

## 2019-12-16 ENCOUNTER — Inpatient Hospital Stay (HOSPITAL_COMMUNITY): Payer: 59

## 2019-12-16 DIAGNOSIS — I4891 Unspecified atrial fibrillation: Secondary | ICD-10-CM

## 2019-12-16 DIAGNOSIS — J9601 Acute respiratory failure with hypoxia: Secondary | ICD-10-CM

## 2019-12-16 LAB — CBC
HCT: 50.4 % — ABNORMAL HIGH (ref 36.0–46.0)
Hemoglobin: 16.6 g/dL — ABNORMAL HIGH (ref 12.0–15.0)
MCH: 29.5 pg (ref 26.0–34.0)
MCHC: 32.9 g/dL (ref 30.0–36.0)
MCV: 89.7 fL (ref 80.0–100.0)
Platelets: 375 10*3/uL (ref 150–400)
RBC: 5.62 MIL/uL — ABNORMAL HIGH (ref 3.87–5.11)
RDW: 12.9 % (ref 11.5–15.5)
WBC: 11.4 10*3/uL — ABNORMAL HIGH (ref 4.0–10.5)
nRBC: 0 % (ref 0.0–0.2)

## 2019-12-16 LAB — COMPREHENSIVE METABOLIC PANEL
ALT: 40 U/L (ref 0–44)
AST: 38 U/L (ref 15–41)
Albumin: 2.8 g/dL — ABNORMAL LOW (ref 3.5–5.0)
Alkaline Phosphatase: 50 U/L (ref 38–126)
Anion gap: 11 (ref 5–15)
BUN: 22 mg/dL — ABNORMAL HIGH (ref 6–20)
CO2: 25 mmol/L (ref 22–32)
Calcium: 8.4 mg/dL — ABNORMAL LOW (ref 8.9–10.3)
Chloride: 105 mmol/L (ref 98–111)
Creatinine, Ser: 0.67 mg/dL (ref 0.44–1.00)
GFR calc Af Amer: >60 mL/min (ref >60)
GFR calc non Af Amer: >60 mL/min (ref >60)
Glucose, Bld: 249 mg/dL — ABNORMAL HIGH (ref 70–99)
Potassium: 4.1 mmol/L (ref 3.5–5.1)
Sodium: 141 mmol/L (ref 135–145)
Total Bilirubin: 0.8 mg/dL (ref 0.3–1.2)
Total Protein: 6.7 g/dL (ref 6.5–8.1)

## 2019-12-16 LAB — ECHOCARDIOGRAM LIMITED
Area-P 1/2: 4.09 cm2
Calc EF: 61.3 %
Height: 62 in
S' Lateral: 2.8 cm
Single Plane A2C EF: 52.6 %
Single Plane A4C EF: 64.9 %
Weight: 4192 oz

## 2019-12-16 LAB — D-DIMER, QUANTITATIVE: D-Dimer, Quant: 0.48 ug/mL-FEU (ref 0.00–0.50)

## 2019-12-16 LAB — FERRITIN: Ferritin: 672 ng/mL — ABNORMAL HIGH (ref 11–307)

## 2019-12-16 LAB — TSH: TSH: 3.917 u[IU]/mL (ref 0.350–4.500)

## 2019-12-16 LAB — GLUCOSE, CAPILLARY
Glucose-Capillary: 230 mg/dL — ABNORMAL HIGH (ref 70–99)
Glucose-Capillary: 286 mg/dL — ABNORMAL HIGH (ref 70–99)
Glucose-Capillary: 264 mg/dL — ABNORMAL HIGH (ref 70–99)
Glucose-Capillary: 302 mg/dL — ABNORMAL HIGH (ref 70–99)
Glucose-Capillary: 310 mg/dL — ABNORMAL HIGH (ref 70–99)

## 2019-12-16 LAB — TROPONIN I (HIGH SENSITIVITY)
Troponin I (High Sensitivity): 6 ng/L (ref <18)
Troponin I (High Sensitivity): 6 ng/L (ref <18)

## 2019-12-16 LAB — C-REACTIVE PROTEIN: CRP: 1.2 mg/dL — ABNORMAL HIGH (ref <1.0)

## 2019-12-16 MED ORDER — METOPROLOL TARTRATE 25 MG PO TABS
25.0000 mg | ORAL_TABLET | Freq: Three times a day (TID) | ORAL | Status: DC
  Administered 2019-12-16 – 2019-12-17 (×3): 25 mg via ORAL
  Filled 2019-12-16: qty 1
  Filled 2019-12-16: qty 2
  Filled 2019-12-16: qty 1

## 2019-12-16 MED ORDER — INSULIN DETEMIR 100 UNIT/ML ~~LOC~~ SOLN
40.0000 [IU] | Freq: Two times a day (BID) | SUBCUTANEOUS | Status: DC
  Administered 2019-12-16 – 2019-12-19 (×6): 40 [IU] via SUBCUTANEOUS
  Filled 2019-12-16 (×7): qty 0.4

## 2019-12-16 MED ORDER — METOPROLOL TARTRATE 5 MG/5ML IV SOLN: 5.0000 mg | INTRAVENOUS | Status: DC | PRN

## 2019-12-16 MED ORDER — DILTIAZEM HCL-DEXTROSE 125-5 MG/125ML-% IV SOLN (PREMIX)
5.0000 mg/h | INTRAVENOUS | Status: DC
  Administered 2019-12-16: 12.5 mg/h via INTRAVENOUS
  Administered 2019-12-16: 5 mg/h via INTRAVENOUS
  Administered 2019-12-18: 7.5 mg/h via INTRAVENOUS
  Administered 2019-12-18 – 2019-12-19 (×2): 5 mg/h via INTRAVENOUS
  Filled 2019-12-16 (×9): qty 125

## 2019-12-16 MED ORDER — METOPROLOL TARTRATE 5 MG/5ML IV SOLN
5.0000 mg | Freq: Once | INTRAVENOUS | Status: AC
  Administered 2019-12-16: 5 mg via INTRAVENOUS
  Filled 2019-12-16: qty 5

## 2019-12-16 MED ORDER — PERFLUTREN LIPID MICROSPHERE
1.0000 mL | INTRAVENOUS | Status: AC | PRN
  Administered 2019-12-16: 2 mL via INTRAVENOUS
  Filled 2019-12-16: qty 10

## 2019-12-16 MED ORDER — DILTIAZEM LOAD VIA INFUSION
10.0000 mg | Freq: Once | INTRAVENOUS | Status: AC
  Administered 2019-12-16: 10 mg via INTRAVENOUS
  Filled 2019-12-16: qty 10

## 2019-12-16 NOTE — Progress Notes (Signed)
PROGRESS NOTE                                                                                                                                                                                                             Patient Demographics:    Linda Richmond, is a 54 y.o. female, DOB - [birth year], YYT:[id number]  Outpatient Primary MD for the patient is Ignatius Specking, MD   Admit date - 2019/12/19   LOS - 6  Chief Complaint  Patient presents with  . Shortness of Breath       Brief Narrative: Patient is a 54 y.o. female with PMHx of anxiety, depression, DM-2, HTN, seronegative RA-who tested positive for COVID-19 on 8/27-admitted to [facility] on 9/3 with acute hypoxemic respiratory failure secondary to COVID-19 pneumonia.  Unfortunately hospital course complicated by worsening hypoxemia requiring HFNC.  Transferred to [facility] for further evaluation and treatment.  See below for further details  COVID-19 vaccinated status: Unvaccinated  Significant Events: 8/27>> COVID-19 positive 9/3>> Admit to [facility] for hypoxemia secondary to COVID-19 9/6>> transferred to [facility]-worsening hypoxemia on 15 L of HFNC 9/8>> A. fib with RVR  Significant studies: 9/3>>Chest x-ray: New bilateral mid to lower lung patchy opacities  COVID-19 medications: Steroids: 9/3>> Remdesivir: 9/3>> 9/7 Baricitinib: 9/5  Antibiotics: None  Microbiology data: 9/20 >>blood culture: 1/2-Staph epidermidis (likely contaminant)  Procedures: None  Consults: None  DVT prophylaxis: SCDs Start: 2019/12/19 1527 Lovenox at therapeutic dosing    Subjective:   Continues to have severe hypoxemia- on 13 L of HFNC and NRB.  Went into A. fib last night.   Assessment  & Plan :   Acute Hypoxic Resp Failure due to Covid 19 Viral pneumonia: Remains with severe hypoxemia-but comfortable-continue steroids/baricitinib-completed course of remdesivir  on 9/7.  No signs of volume overload-we will try to keep in negative balance is much as possible.  Continue to watch very closely-if she deteriorates significantly-she will require transfer to the ICU.    Fever: afebrile O2 requirements:  SpO2: (!) 87 % O2 Flow Rate (L/min): 13 L/min (15 nrb) FiO2 (%): 94 %   COVID-19 Labs: Recent Labs    12/14/19 0522 12/15/19 0555 12/16/19 0334  DDIMER <0.27 0.37 0.48  FERRITIN  --   --  672*  CRP 3.6* 2.1* 1.2*  Component Value Date/Time   BNP 20.9 12/15/2019 0555    Recent Labs  Lab 01/05/2020 1330  PROCALCITON <0.10    Lab Results  Component Value Date   SARSCOV2NAA POSITIVE (A) 12/16/2019   SARSCOV2NAA Not Detected 04/13/2019   SARSCOV2NAA Not Detected 02/02/2019   SARSCOV2NAA Not Detected 01/22/2019      Prone/Incentive Spirometry: encouraged patient to lie prone for 3-4 hours at a time for a total of 16 hours a day, and to encourage incentive spirometry use 3-4/hour.  A. fib with RVR: Remains in A. fib with RVR this morning-on max Cardizem gtt.-start beta-blocker-await echo-remains on therapeutic Lovenox.  Per patient-she has seen cardiology in the past-for "tachycardia".  Have consulted cardiology for assistance with rate control.  DM-2 (A1c 10.8) with steroid induced hypoglycemia: CBGs uncontrolled this morning-increase Levemir to 40 units twice daily, continue 18 units of NovoLog premeals and resistant SSI.  Follow and adjust.  Recent Labs    12/15/19 2113 12/16/19 0244 12/16/19 0749  GLUCAP 301* 230* 286*   HTN: BP controlled-hold Avapro-as patient is on Cardizem and metoprolol gtt to allow more room for adjustment.  Hypothyroidism: Continue thyroid Armour-TSH within normal limits  GERD: Continue PPI  Positive blood culture-1/2+ for staph epidermidis-likely represents a contaminant-no treatment required.  Nutrition Problem: Nutrition Problem: Increased nutrient needs Etiology: acute illness  (COVID-19) Signs/Symptoms: estimated needs Interventions: Glucerna shake, MVI, Premier Protein  Morbid Obesity: Estimated body mass index is 47.92 kg/m as calculated from the following:   Height as of this encounter:  (1.575 m).   Weight as of this encounter: 118.8 kg.    ABG: No results found for: PHART, PCO2ART, PO2ART, HCO3, TCO2, ACIDBASEDEF, O2SAT  Vent Settings:     Condition - Extremely Guarded  Family Communication  :  Daughter Marchelle Folks [phone]) and son Chrissie Noa over the phone on 9/9  Code Status :  Full Code  Diet :  Diet Order            Diet Carb Modified Fluid consistency: Thin; Room service appropriate? Yes  Diet effective now                  Disposition Plan  :   Status is: Inpatient  Remains inpatient appropriate because:Inpatient level of care appropriate due to severity of illness  Dispo: The patient is from: Home              Anticipated d/c is to: Home              Anticipated d/c date is: > 3 days              Patient currently is not medically stable to d/c.   Barriers to discharge: Hypoxia requiring O2 supplementation/complete 5 days of IV Remdesivir  Antimicorbials  :    Anti-infectives (From admission, onward)   Start     Dose/Rate Route Frequency Ordered Stop   12/11/19 1000  remdesivir 100 mg in sodium chloride 0.9 % 100 mL IVPB  Status:  Discontinued       "Followed by" Linked Group Details   100 mg 200 mL/hr over 30 Minutes Intravenous Daily 12/17/2019 1529 12/27/2019 1532   12/11/19 1000  remdesivir 100 mg in sodium chloride 0.9 % 100 mL IVPB        100 mg 200 mL/hr over 30 Minutes Intravenous Daily 01/05/2020 1533 12/14/19 0907   12/08/2019 1545  remdesivir 100 mg in sodium chloride 0.9 % 100 mL IVPB  100 mg 200 mL/hr over 30 Minutes Intravenous Every 30 min 12/20/2019 1533 12/18/2019 1850   12/17/2019 1530  remdesivir 200 mg in sodium chloride 0.9% 250 mL IVPB  Status:  Discontinued       "Followed by" Linked Group Details    200 mg 580 mL/hr over 30 Minutes Intravenous Once 12/18/2019 1529 12/14/2019 1532      Inpatient Medications  Scheduled Meds: . baricitinib  4 mg Oral Daily  . enoxaparin (LOVENOX) injection  120 mg Subcutaneous Q12H  . insulin aspart  0-20 Units Subcutaneous TID WC  . insulin aspart  0-5 Units Subcutaneous QHS  . insulin aspart  18 Units Subcutaneous TID WC  . insulin detemir  34 Units Subcutaneous BID  . Ipratropium-Albuterol  1 puff Inhalation Q6H  . linagliptin  5 mg Oral Daily  . methylPREDNISolone (SOLU-MEDROL) injection  50 mg Intravenous Q12H  . metoprolol tartrate  25 mg Oral TID  . thyroid  30 mg Oral QAC breakfast  . Vitamin D (Ergocalciferol)  50,000 Units Oral Q7 days   Continuous Infusions: . sodium chloride 250 mL (12/12/19 0851)  . diltiazem (CARDIZEM) infusion 15 mg/hr (12/16/19 0737)   PRN Meds:.sodium chloride, acetaminophen, ALPRAZolam, chlorpheniramine-HYDROcodone, guaiFENesin-dextromethorphan, metoprolol tartrate, pantoprazole   Time Spent in minutes 35   See all Orders from today for further details   Jeoffrey Massed M.D on 12/16/2019 at 11:24 AM  To page go to [email or web address] - use universal password  [facility] -  Office  [phone]    Objective:   Vitals:   12/16/19 0630 12/16/19 0632 12/16/19 0735 12/16/19 1107  BP: (!) 143/98  (!) 123/100 114/67  Pulse: (!) 119   74  Resp: 19  20 20   Temp: 97.9 F (36.6 C)  98.5 F (36.9 C) 98.9 F (37.2 C)  TempSrc: Axillary  Oral Oral  SpO2:  91%  (!) 87%  Weight:      Height:        Wt Readings from Last 3 Encounters:  12/14/2019 118.8 kg  10/25/16 121.1 kg  08/23/16 126.6 kg     Intake/Output Summary (Last 24 hours) at 12/16/2019 1124 Last data filed at 12/16/2019 0900 Gross per 24 hour  Intake 616.31 ml  Output 100 ml  Net 516.31 ml     Physical Exam Gen Exam:Alert awake-not in any distress HEENT:atraumatic, normocephalic Chest: B/L clear to auscultation anteriorly CVS:S1S2  regular Abdomen:soft non tender, non distended Extremities:no edema Neurology: Non focal Skin: no rash   Data Review:    CBC Recent Labs  Lab 12/27/2019 1330 12/22/2019 1330 12/11/19 0851 12/12/19 0755 12/14/19 0522 12/15/19 0555 12/16/19 0334  WBC 2.8*   < > 2.0* 2.6* 6.2 4.6 11.4*  HGB 15.7*   < > 15.3* 16.8* 15.6* 15.3* 16.6*  HCT 48.1*   < > 47.7* 53.0* 47.2* 48.0* 50.4*  PLT 179   < > 194 210 282 268 375  MCV 92.3   < > 93.2 95.0 91.7 92.1 89.7  MCH 30.1   < > 29.9 30.1 30.3 29.4 29.5  MCHC 32.6   < > 32.1 31.7 33.1 31.9 32.9  RDW 13.4   < > 13.3 13.2 13.0 12.9 12.9  LYMPHSABS 0.9  --  0.6* 0.8 0.6* 0.6*  --   MONOABS 0.2  --  0.3 0.3 0.5 0.3  --   EOSABS 0.0  --  0.0 0.0 0.0 0.0  --   BASOSABS 0.0  --  0.0 0.0 0.0 0.0  --    < > =  values in this interval not displayed.    Chemistries  Recent Labs  Lab 12/11/19 0851 12/12/19 0755 12/14/19 0522 12/15/19 0555 12/16/19 0334  NA 136 137 141 143 141  K 3.9 4.1 3.7 4.5 4.1  CL 102 104 104 108 105  CO2 21* 19* GLUCOSE 305* 297* 280* 159* 249*  BUN 22*  CREATININE 0.68 0.65 0.67 0.68 0.67  CALCIUM 8.0* 8.4* 8.4* 8.3* 8.4*  AST 40 36 39 37 38  ALT 33 30 34 35 40  ALKPHOS 49 50 49 46 50  BILITOT 0.9 0.6 0.7 0.7 0.8   ------------------------------------------------------------------------------------------------------------------ No results for input(s): CHOL, HDL, LDLCALC, TRIG, CHOLHDL, LDLDIRECT in the last 72 hours.  Lab Results  Component Value Date   HGBA1C 10.8 (H) 12-20-2019   ------------------------------------------------------------------------------------------------------------------ Recent Labs    12/16/19 0334  TSH 3.917   ------------------------------------------------------------------------------------------------------------------ Recent Labs    12/16/19 0334  FERRITIN 672*    Coagulation profile No results for input(s): INR, PROTIME in the last 168  hours.  Recent Labs    12/15/19 0555 12/16/19 0334  DDIMER 0.37 0.48    Cardiac Enzymes No results for input(s): CKMB, TROPONINI, MYOGLOBIN in the last 168 hours.  Invalid input(s): CK ------------------------------------------------------------------------------------------------------------------    Component Value Date/Time   BNP 20.9 12/15/2019 0555    Micro Results Recent Results (from the past 240 hour(s))  SARS Coronavirus 2 by RT PCR (hospital order, performed in [facility] hospital lab) Nasopharyngeal Nasopharyngeal Swab     Status: Abnormal   Collection Time: 12-20-2019  3:32 PM   Specimen: Nasopharyngeal Swab  Result Value Ref Range Status   SARS Coronavirus 2 POSITIVE (A) NEGATIVE Final    Comment: RESULT CALLED TO, READ BACK BY AND VERIFIED WITH: TALBOTT,T @ 1846 ON Dec 20, 2019 BY JUW (NOTE) SARS-CoV-2 target nucleic acids are DETECTED  SARS-CoV-2 RNA is generally detectable in upper respiratory specimens  during the acute phase of infection.  Positive results are indicative  of the presence of the identified virus, but do not rule out bacterial infection or co-infection with other pathogens not detected by the test.  Clinical correlation with patient history and  other diagnostic information is necessary to determine patient infection status.  The expected result is negative.  Fact Sheet for Patients:   BoilerBrush.com.cy   Fact Sheet for Healthcare Providers:   https://pope.com/    This test is not yet approved or cleared by the [place] FDA and  has been authorized for detection and/or diagnosis of SARS-CoV-2 by FDA under an Emergency Use Authorization (EUA).  This EUA will remain in effect (meaning this tes t can be used) for the duration of  the COVID-19 declaration under Section 564(b)(1) of the Act, 21 U.S.C. section 360-bbb-3(b)(1), unless the authorization is terminated or revoked  sooner.  Performed at [facility] [address] [place], [place] [address]   Blood Culture (routine x 2)     Status: Abnormal   Collection Time: December 20, 2019  3:43 PM   Specimen: Right Antecubital; Blood  Result Value Ref Range Status   Specimen Description   Final    RIGHT ANTECUBITAL Performed at [facility], [address], [place], [place] [address]    Special Requests   Final    BOTTLES DRAWN AEROBIC AND ANAEROBIC Blood Culture adequate volume Performed at [facility], [address], [place], [place] [address]    Culture  Setup Time   Final    Romie Minus  POSITIVE COCCI ANAEROBIC BOTTLE ONLY Gram Stain Report Called to,Read Back By and Verified With: BAYNES @ 1220 ON [id number] BY HENDERSON L. CRITICAL RESULT CALLED TO, READ BACK BY AND VERIFIED WITH: RN BRANDY BAYNES [phone] FCP    Culture (A)  Final    STAPHYLOCOCCUS EPIDERMIDIS THE SIGNIFICANCE OF ISOLATING THIS ORGANISM FROM A SINGLE SET OF BLOOD CULTURES WHEN MULTIPLE SETS ARE DRAWN IS UNCERTAIN. PLEASE NOTIFY THE MICROBIOLOGY DEPARTMENT WITHIN ONE WEEK IF SPECIATION AND SENSITIVITIES ARE REQUIRED. Performed at [facility], [address], [place], [place] [address]    Report Status 12/13/2019 FINAL  Final  Blood Culture ID Panel (Reflexed)     Status: Abnormal   Collection Time: 01/02/2020  3:43 PM  Result Value Ref Range Status   Enterococcus faecalis NOT DETECTED NOT DETECTED Final   Enterococcus Faecium NOT DETECTED NOT DETECTED Final   Listeria monocytogenes NOT DETECTED NOT DETECTED Final   Staphylococcus species DETECTED (A) NOT DETECTED Final    Comment: CRITICAL RESULT CALLED TO, READ BACK BY AND VERIFIED WITH: RN BRANDY BAYNES 1836 [phone] FCP    Staphylococcus aureus (BCID) NOT DETECTED NOT DETECTED Final   Staphylococcus epidermidis DETECTED (A) NOT DETECTED Final    Comment: CRITICAL RESULT CALLED TO, READ BACK BY AND VERIFIED WITH: RN BRANDY BAYNES 1836 [phone] FCP    Staphylococcus lugdunensis NOT  DETECTED NOT DETECTED Final   Streptococcus species NOT DETECTED NOT DETECTED Final   Streptococcus agalactiae NOT DETECTED NOT DETECTED Final   Streptococcus pneumoniae NOT DETECTED NOT DETECTED Final   Streptococcus pyogenes NOT DETECTED NOT DETECTED Final   A.calcoaceticus-baumannii NOT DETECTED NOT DETECTED Final   Bacteroides fragilis NOT DETECTED NOT DETECTED Final   Enterobacterales NOT DETECTED NOT DETECTED Final   Enterobacter cloacae complex NOT DETECTED NOT DETECTED Final   Escherichia coli NOT DETECTED NOT DETECTED Final   Klebsiella aerogenes NOT DETECTED NOT DETECTED Final   Klebsiella oxytoca NOT DETECTED NOT DETECTED Final   Klebsiella pneumoniae NOT DETECTED NOT DETECTED Final   Proteus species NOT DETECTED NOT DETECTED Final   Salmonella species NOT DETECTED NOT DETECTED Final   Serratia marcescens NOT DETECTED NOT DETECTED Final   Haemophilus influenzae NOT DETECTED NOT DETECTED Final   Neisseria meningitidis NOT DETECTED NOT DETECTED Final   Pseudomonas aeruginosa NOT DETECTED NOT DETECTED Final   Stenotrophomonas maltophilia NOT DETECTED NOT DETECTED Final   Candida albicans NOT DETECTED NOT DETECTED Final   Candida auris NOT DETECTED NOT DETECTED Final   Candida glabrata NOT DETECTED NOT DETECTED Final   Candida krusei NOT DETECTED NOT DETECTED Final   Candida parapsilosis NOT DETECTED NOT DETECTED Final   Candida tropicalis NOT DETECTED NOT DETECTED Final   Cryptococcus neoformans/gattii NOT DETECTED NOT DETECTED Final   Methicillin resistance mecA/C NOT DETECTED NOT DETECTED Final    Comment: Performed at [facility], [address], [place], [place] [address]  Blood Culture (routine x 2)     Status: None   Collection Time: 01/02/2020  3:44 PM   Specimen: Left Antecubital; Blood  Result Value Ref Range Status   Specimen Description LEFT ANTECUBITAL  Final   Special Requests   Final    BOTTLES DRAWN AEROBIC ONLY Blood Culture results may not be optimal  due to an inadequate volume of blood received in culture bottles   Culture   Final    NO GROWTH 5 DAYS Performed at [facility], [address], [place], [place] [address]  Report Status 12/15/2019 FINAL  Final    Radiology Reports DG Chest Port 1 View  Result Date: 12/14/2019 CLINICAL DATA:  Shortness of breath, COVID. EXAM: PORTABLE CHEST 1 VIEW COMPARISON:  Prior chest radiographs 01/05/2020 and earlier. FINDINGS: Shallow inspiration radiograph. The cardiomediastinal silhouette is unchanged. Similar to the prior examination of 12/12/2019, there are interstitial and ill-defined airspace opacities within the left greater than right mid to lower lung fields. No definite pleural effusion. No evidence of pneumothorax. No acute bony abnormality identified. IMPRESSION: Shallow inspiration radiograph. Unchanged appearance of interstitial and ill-defined airspace opacities within the left greater than right mid-to-lower lung fields. These findings likely reflect multifocal pneumonia given the provided history of COVID positivity. Electronically Signed   By: Jackey Loge DO   On: 12/14/2019 08:20   DG Chest Port 1 View  Result Date: 12/16/2019 CLINICAL DATA:  COVID positive EXAM: PORTABLE CHEST 1 VIEW COMPARISON:  04/19/2019 chest radiograph and prior. FINDINGS: Hypoinflated lungs. Bilateral mid to lower lung patchy opacities, new since prior exam. No pneumothorax. Trace bilateral effusions. Cardiomediastinal silhouette within normal limits. IMPRESSION: New bilateral mid to lower lung patchy opacities. Hypoinflated lungs. Electronically Signed   By: Stana Bunting M.D.   On: 12/24/2019 13:57

## 2019-12-16 NOTE — Progress Notes (Signed)
Inpatient Diabetes Program Recommendations  AACE/ADA: New Consensus Statement on Inpatient Glycemic Control (2015)  Target Ranges:  Prepandial:   less than 140 mg/dL      Peak postprandial:   less than 180 mg/dL (1-2 hours)      Critically ill patients:  140 - 180 mg/dL   Lab Results  Component Value Date   GLUCAP 286 (H) 12/16/2019   HGBA1C 10.8 (H) 12/29/2019    Review of Glycemic Control Results for Linda Richmond, Linda Richmond (MRN [id number]) as of 12/16/2019 11:29  Ref. Range 12/15/2019 07:24 12/15/2019 11:54 12/15/2019 14:04 12/15/2019 16:17 12/15/2019 21:13 12/16/2019 02:44 12/16/2019 07:49  Glucose-Capillary Latest Ref Range: 70 - 99 mg/dL 338 (H)  Novolog 4 units  Levemir 34 units  232 (H)  Novolog 7 units 270 (H)  Novolog 18 units 307 (H)  Novolog 15 units + Novolog 18 units given at 1808 301 (H)  Novolog 4 units  Levemir 34 units 230 (H) 286 (H)  Novolog 11 units  Levemir 34 units   Diabetes history: DM 2 Outpatient Diabetes medications: none listed/pt reports metformin Current orders for Inpatient glycemic control:  Levemir 34 units bid Novolog 0-20 units tid + hs Novolog 18 units tid mc Tradjenta 5 mg Daily  A1c 10.8% on 9/3 Solumedrol 50 mg Q12 hours [employer] insurance PCP: Dr. Joya Martyr   -  Consider increasing Levemir to 40 units bid.  Watch trends with Novolog 18 units meal coverage.   D/c planning: Discussed with pt the needs for glucose checks when going home while on potential steroids.  Lantus solostar insulin pen (emailed copay card link for pt) order # [id number] Insulin pen needles order # [id number]   Thanks,  Christena Deem RN, MSN, BC-ADM Inpatient Diabetes Coordinator Team Pager [phone] (8a-5p)

## 2019-12-16 NOTE — Progress Notes (Signed)
PT Cancellation Note  Patient Details Name: Linda Richmond MRN: [id number] DOB: Aug 20, 1965   Cancelled Treatment:    Reason Eval/Treat Not Completed: Fatigue limiting ability to participate;Medical issues which prohibited therapy. Pt was in afib with rvr this AM and this PM pt reports fatigue after not sleeping all night. Will check on tomorrow.    Angelina Ok [facility] 12/16/2019, 4:49 PM Skip Mayer PT Acute Rehabilitation Services Pager [phone] Office [phone]

## 2019-12-16 NOTE — Progress Notes (Signed)
   12/16/19 0246  Assess: MEWS Score  Temp 98.3 F (36.8 C)  BP (!) 148/90  Pulse Rate 100  ECG Heart Rate (!) 151  Resp 18  Level of Consciousness Alert  O2 Device HFNC;Non-rebreather Mask  Patient Activity (if Appropriate) In bed  O2 Flow Rate (L/min) 13 L/min (15 nrb)  Assess: MEWS Score  MEWS Temp 0  MEWS Systolic 0  MEWS Pulse 3  MEWS RR 0  MEWS LOC 0  MEWS Score 3  MEWS Score Color Yellow  Assess: if the MEWS score is Yellow or Red  Were vital signs taken at a resting state? Yes  Focused Assessment No change from prior assessment  Early Detection of Sepsis Score *See Row Information* Low  MEWS guidelines implemented *See Row Information* No, previously yellow, continue vital signs every 4 hours  Treat  MEWS Interventions Other (Comment)  Pain Scale 0-10  Pain Score 0   Pt went into Afib with HR in 160's. Paged on Call MD. Orders given. This RN gave IV metoprolol 5mg /ml (2 times); as well as 120mg  Lovenox sq. Pt denies chest pain or any symptoms at this time.   HR increased to 171; 4 minutes after 0.5mg  Xanax tab was given. Will continue to monitor.   , RN

## 2019-12-16 NOTE — Consult Note (Signed)
Cardiology Consultation:   Patient ID: Linda Richmond; [id number]; 04/27/1965   Admit date: Dec 22, 2019 Date of Consult: 12/16/2019  Primary Care Provider: Ignatius Specking, MD Primary Cardiologist: Remotely seen by Dr. Wyline Mood; last in 2018 Primary Electrophysiologist:  None  Due to the COVID-19 pandemic, this visit was completed with telemedicine (audio) technology to reduce patient and provider exposure.  Patient Profile:   Linda Richmond is a 54 y.o. female with a PMH of HTN, palpitations, DM type 2, hypothyroidism, anxiety, depression, and GERD, who is being seen today for the evaluation of atrial fibrillation at the request of Dr. Jerral Ralph.  History of Present Illness:   Linda Richmond was in her usual state of health until a couple weeks ago when she began experiencing HA, body ache, nasal congestion, and rhinorrhea. She tested positive for COVID-19 12/03/19 and was prescribed a Z-pak and prednisone, however after little improvement and progressive SOB, she presented to [facility] December 22, 2019 for further evaluation. She was hypoxic to the low-mid 80s on arrival. Admitted to medicine for further care and subsequently transferred to [facility] for worsening hypoxemia on 15 L of HFNC. She completed a course of remdesivir. She remains on steroids, baricitinib, and supportive care for ongoing hypoxemia. On 12/15/19 around 10:15pm she went into atrial fibrillation with RVR. This is new for her. She was started on a diltiazem gtt overnight and metoprolol tartrate 25mg  TID added for improved rate control. HR improved to the 80s-100s. She was transitioned from ppx lovenox to therapeutic lovenox. Cardiology asked to evaluate for new onset atrial fibrillation.  She was last evaluated by cardiology at an outpatient visit with Dr. in 2018 for follow-up of her LE edema and palpitations. LE edema was stable on lasix TIW, however having trouble with orthostatic hypotension, suggesting volume depletion. Her lasix was  decreased and she was encouraged to use compression stockings. Follow-up in 3 months was recommended, however the patient was lost to follow-up. Her last echocardiogram in 2018 showed EF 55%, G1DD, mild LVH, and no significant valvular abnormalities. No prior ischemic evaluation.   Past Medical History:  Diagnosis Date  . Anxiety   . Depression   . Diabetes mellitus without complication (HCC)   . GERD (gastroesophageal reflux disease)   . Hypertension   . Hypothyroidism   . Palpitations   . Rheumatoid arthritis [facility])     Past Surgical History:  Procedure Laterality Date  . BLADDER SURGERY     X's 3 for interstitial cystitis  . CHOLECYSTECTOMY       Home Medications:  Prior to Admission medications   Medication Sig Start Date End Date Taking? Authorizing Provider  ALPRAZolam [facility]) 0.5 MG tablet Take 0.25-0.5 mg by mouth daily as needed for anxiety.    Yes [provider]  busPIRone (BUSPAR) 15 MG tablet Take 1 tablet by mouth in the morning, at noon, and at bedtime. 07/19/19  Yes [provider]  escitalopram (LEXAPRO) 5 MG tablet Take 5 mg by mouth daily. 07/19/19  Yes [provider]  levothyroxine (SYNTHROID) 25 MCG tablet Take 25 mcg by mouth daily. 10/04/19  Yes [provider]  olmesartan (BENICAR) 20 MG tablet Take 20 mg by mouth 2 (two) times daily.   Yes [provider]  pantoprazole (PROTONIX) 40 MG tablet Take 40 mg by mouth daily as needed (for GERD/Avid reflux).    Yes [provider]  Vitamin D, Ergocalciferol, (DRISDOL) 50000 units CAPS capsule  07/17/16  Yes [provider]  furosemide (LASIX) 20 MG tablet Take 1 tablet (20 mg total) by mouth daily as needed for edema (swelling). 08/23/16 11/21/16  Antoine Poche, MD  thyroid (ARMOUR) 30 MG tablet Take 30 mg by mouth daily before breakfast. Patient not taking: Reported on 12/11/2019    [provider]    Inpatient Medications: Scheduled Meds: .  baricitinib  4 mg Oral Daily  . enoxaparin (LOVENOX) injection  120 mg Subcutaneous Q12H  . insulin aspart  0-20 Units Subcutaneous TID WC  . insulin aspart  0-5 Units Subcutaneous QHS  . insulin aspart  18 Units Subcutaneous TID WC  . insulin detemir  40 Units Subcutaneous BID  . Ipratropium-Albuterol  1 puff Inhalation Q6H  . linagliptin  5 mg Oral Daily  . methylPREDNISolone (SOLU-MEDROL) injection  50 mg Intravenous Q12H  . metoprolol tartrate  25 mg Oral TID  . thyroid  30 mg Oral QAC breakfast  . Vitamin D (Ergocalciferol)  50,000 Units Oral Q7 days   Continuous Infusions: . sodium chloride 250 mL (12/12/19 0851)  . diltiazem (CARDIZEM) infusion 15 mg/hr (12/16/19 0737)   PRN Meds: sodium chloride, acetaminophen, ALPRAZolam, chlorpheniramine-HYDROcodone, guaiFENesin-dextromethorphan, metoprolol tartrate, pantoprazole  Allergies:    Allergies  Allergen Reactions  . Ancef [Cefazolin] Hives  . Aspirin Other (See Comments)    GI Bleed  . Doxycycline Diarrhea and Nausea And Vomiting  . Sulfa Antibiotics Hives    Possible loss of consciousness  . Ciprofloxacin Rash    "feels like bugs are crawling in my head"  . Lesinurad-Allopurinol Nausea And Vomiting  . Penicillins Rash    Social History:   Social History   Socioeconomic History  . Marital status: Single    Spouse name: Not on file  . Number of children: Not on file  . Years of education: Not on file  . Highest education level: Not on file  Occupational History  . Not on file  Tobacco Use  . Smoking status: Never Smoker  . Smokeless tobacco: Never Used  Substance and Sexual Activity  . Alcohol use: No  . Drug use: No  . Sexual activity: Not on file  Other Topics Concern  . Not on file  Social History Narrative  . Not on file   Social Determinants of Health   Financial Resource Strain:   . Difficulty of Paying Living Expenses: Not on file  Food Insecurity:   . Worried About Programme researcher, broadcasting/film/video in the  Last Year: Not on file  . Ran Out of Food in the Last Year: Not on file  Transportation Needs:   . Lack of Transportation (Medical): Not on file  . Lack of Transportation (Non-Medical): Not on file  Physical Activity:   . Days of Exercise per Week: Not on file  . Minutes of Exercise per Session: Not on file  Stress:   . Feeling of Stress : Not on file  Social Connections:   . Frequency of Communication with Friends and Family: Not on file  . Frequency of Social Gatherings with Friends and Family: Not on file  . Attends Religious Services: Not on file  . Active Member of Clubs or Organizations: Not on file  . Attends Banker Meetings: Not on file  . Marital Status: Not on file  Intimate Partner Violence:   . Fear of Current or Ex-Partner: Not on file  . Emotionally Abused: Not on file  . Physically Abused: Not on file  . Sexually Abused: Not  on file    Family History:    Family History  Problem Relation Age of Onset  . High blood pressure Mother   . Pancreatic cancer Father      ROS:  Please see the history of present illness.  ROS  All other ROS reviewed and negative.     Physical Exam/Data:   Vitals:   12/16/19 0630 12/16/19 0632 12/16/19 0735 12/16/19 1107  BP: (!) 143/98  (!) 123/100 114/67  Pulse: (!) 119   74  Resp: 19  20 20   Temp: 97.9 F (36.6 C)  98.5 F (36.9 C) 98.9 F (37.2 C)  TempSrc: Axillary  Oral Oral  SpO2:  91%  (!) 87%  Weight:      Height:        Intake/Output Summary (Last 24 hours) at 12/16/2019 1410 Last data filed at 12/16/2019 1300 Gross per 24 hour  Intake 736.31 ml  Output 100 ml  Net 636.31 ml   Filed Weights   12-31-2019 1248  Weight: 118.8 kg   Body mass index is 47.92 kg/m.  General:   in no acute distress Resp: able to converse comfortably  EKG:  The EKG was personally reviewed and demonstrates:  Initial EKG with sinus rhythm, rate 99 bpm, submm STE in III, otherwise no STE/D. Repeat EKG 12/15/19 with atrial  fibrillation with RVR with rate 153 bpm, submm STD in lateral leads, ongoing submm STE in III.  Telemetry:  Telemetry was personally reviewed and demonstrates:  Atrial fibrillation with RVR onset around 10:30pm 12/15/19, rates improved, predominately in the 80s-100s at this time  Relevant CV Studies: Echocardiogram pending  Laboratory Data:  Chemistry Recent Labs  Lab 12/14/19 0522 12/15/19 0555 12/16/19 0334  NA 141 143 141  K 3.7 4.5 4.1  CL 104 108 105  CO2 24 22 25   GLUCOSE 280* 159* 249*  BUN 18 20 22*  CREATININE 0.67 0.68 0.67  CALCIUM 8.4* 8.3* 8.4*  GFRNONAA >60 >60 >60  GFRAA >60 >60 >60  ANIONGAP 13 13 11     Recent Labs  Lab 12/14/19 0522 12/15/19 0555 12/16/19 0334  PROT 6.4* 6.3* 6.7  ALBUMIN 2.7* 2.7* 2.8*  AST 39 37 38  ALT 34 35 40  ALKPHOS 49 46 50  BILITOT 0.7 0.7 0.8   Hematology Recent Labs  Lab 12/14/19 0522 12/15/19 0555 12/16/19 0334  WBC 6.2 4.6 11.4*  RBC 5.15* 5.21* 5.62*  HGB 15.6* 15.3* 16.6*  HCT 47.2* 48.0* 50.4*  MCV 91.7 92.1 89.7  MCH 30.3 29.4 29.5  MCHC 33.1 31.9 32.9  RDW 13.0 12.9 12.9  PLT 282 268 375   Cardiac EnzymesNo results for input(s): TROPONINI in the last 168 hours. No results for input(s): TROPIPOC in the last 168 hours.  BNP Recent Labs  Lab 12/15/19 0555  BNP 20.9    DDimer  Recent Labs  Lab 12/14/19 0522 12/15/19 0555 12/16/19 0334  DDIMER <0.27 0.37 0.48    Radiology/Studies:  DG Chest Port 1 View  Result Date: 12/14/2019 CLINICAL DATA:  Shortness of breath, COVID. EXAM: PORTABLE CHEST 1 VIEW COMPARISON:  Prior chest radiographs 31-Dec-2019 and earlier. FINDINGS: Shallow inspiration radiograph. The cardiomediastinal silhouette is unchanged. Similar to the prior examination of 12-31-19, there are interstitial and ill-defined airspace opacities within the left greater than right mid to lower lung fields. No definite pleural effusion. No evidence of pneumothorax. No acute bony abnormality  identified. IMPRESSION: Shallow inspiration radiograph. Unchanged appearance of interstitial and ill-defined airspace opacities  within the left greater than right mid-to-lower lung fields. These findings likely reflect multifocal pneumonia given the provided history of COVID positivity. Electronically Signed   By: Jackey Loge DO   On: 12/14/2019 08:20    Assessment and Plan:   1. New onset Atrial fibrillation with RVR: patient currently admitted with COVID-19 PNA with ongoing hypoxemia requiring HFNC and NRB. On 12/15/19 around 10:15pm she went into atrial fibrillation with RVR. HsTrop negative x2. TSH wnl. Started on a diltiazem gtt with metoprolol added this morning. HR improved, predominately in the 80s-100s at this time. Likely driven by underlying infection.  - Echo completed - await read - Can continue diltiazem gtt and metoprolol tartrate for rate control - anticipate transition to po in the next 24 hours - Can continue therapeutic lovenox for stroke ppx until clear no further interventions are necessary. Would ultimately transition to apixaban 5mg  BID for CHA2DS2-VASc Score of at least 3 (HTN, DM type 2, and Female) - Could consider an outpatient ischemic evaluation pending recovery from COVID-19 infection.  2. COVID-19 PNA: still with severe hypoxemia requiring HFNC and NRB at times. She completed a course of remdesivir. She remains on steroids, baricitinib, and supportive care. Likely driving #1 - Continue management per primary team  3. HTN: BP stable. ARB on hold to allow room for titration of rate controlling medications - Continue current regimen  4. DM type 2: poorly controlled with A1C 10.8. Appears to be a new diagnosis this admission. BS exacerbated by steroids this admission. - Continue management per primary team  5. Hypothyroidism: TSH wnl this admission - Continue armour thyroid    For questions or updates, please contact [facility] Please consult [email or web address] for  contact info under Cardiology/STEMI.   Signed, Beatriz Stallion, PA-C  12/16/2019 2:10 PM [phone]  Agree with above documentation.  Linda Richmond is a 54 year old female with a history of type 2 diabetes mellitus, hypertension, hypothyroidism who we are consulted to see by Dr.Ghimire for evaluation of atrial fibrillation.  She was diagnosed with COVID-19 on December 03, 2019 and started on Z-Pak and prednisone as an outpatient.  However she developed worsening dyspnea, prompting her to present to [facility] on December 10, 2019.  She was found to be hypoxic to 80s and was admitted and transferred to [facility].  Initially required 15 L HFNC, currently on 13L.  She was treated with remdesivir, steroids, baricitinib.  Yesterday evening she went into atrial fibrillation with RVR.  She was started on a diltiazem drip and metoprolol 25 mg 3 times daily.  She was started on therapeutic Lovenox.  Discussed with patient.  She is alert and able to converse comfortably.  Telemetry personally reviewed and showed AF initially in the 170s yesterday evening, currently improved to 80s to 100s on diltiazem 12.5 mg/h.  Echocardiogram personally reviewed and shows normal systolic function.  For her atrial fibrillation, she currently appears rate controlled on diltiazem drip and p.o. metoprolol.  Can continue for now, will titrate metoprolol and try to transition off diltiazem drip tomorrow.  Continue Lovenox for anticoagulation, would plan to transition to Eliquis 5 mg twice daily if no further procedures planned.   Little Ishikawa, MD

## 2019-12-16 NOTE — Progress Notes (Signed)
°  Echocardiogram 2D Echocardiogram limited with definity has been performed.  [name] M 12/16/2019, 3:14 PM

## 2019-12-16 NOTE — Progress Notes (Signed)
Diltiazem 125mg  infusion started at 0523. Will continue to monitor.

## 2019-12-16 NOTE — Progress Notes (Signed)
Tele notified of 1.8 sec pause. HR sustaining in 90s. Notified MD. MD said ok to decrease gtt by 2.5 mg/hr. Diltiazem gtt now at 12.5 mg/hr.  Will continue to monitor patient.

## 2019-12-17 LAB — CBC
HCT: 53 % — ABNORMAL HIGH (ref 36.0–46.0)
Hemoglobin: 17.1 g/dL — ABNORMAL HIGH (ref 12.0–15.0)
MCH: 29.4 pg (ref 26.0–34.0)
MCHC: 32.3 g/dL (ref 30.0–36.0)
MCV: 91.1 fL (ref 80.0–100.0)
Platelets: 402 10*3/uL — ABNORMAL HIGH (ref 150–400)
RBC: 5.82 MIL/uL — ABNORMAL HIGH (ref 3.87–5.11)
RDW: 13 % (ref 11.5–15.5)
WBC: 10.5 10*3/uL (ref 4.0–10.5)
nRBC: 0 % (ref 0.0–0.2)

## 2019-12-17 LAB — GLUCOSE, CAPILLARY
Glucose-Capillary: 231 mg/dL — ABNORMAL HIGH (ref 70–99)
Glucose-Capillary: 236 mg/dL — ABNORMAL HIGH (ref 70–99)
Glucose-Capillary: 211 mg/dL — ABNORMAL HIGH (ref 70–99)
Glucose-Capillary: 224 mg/dL — ABNORMAL HIGH (ref 70–99)

## 2019-12-17 LAB — COMPREHENSIVE METABOLIC PANEL
ALT: 44 U/L (ref 0–44)
AST: 37 U/L (ref 15–41)
Albumin: 2.8 g/dL — ABNORMAL LOW (ref 3.5–5.0)
Alkaline Phosphatase: 57 U/L (ref 38–126)
Anion gap: 12 (ref 5–15)
BUN: 21 mg/dL — ABNORMAL HIGH (ref 6–20)
CO2: 26 mmol/L (ref 22–32)
Calcium: 8.5 mg/dL — ABNORMAL LOW (ref 8.9–10.3)
Chloride: 102 mmol/L (ref 98–111)
Creatinine, Ser: 0.82 mg/dL (ref 0.44–1.00)
GFR calc Af Amer: >60 mL/min (ref >60)
GFR calc non Af Amer: >60 mL/min (ref >60)
Glucose, Bld: 254 mg/dL — ABNORMAL HIGH (ref 70–99)
Potassium: 4.2 mmol/L (ref 3.5–5.1)
Sodium: 140 mmol/L (ref 135–145)
Total Bilirubin: 0.7 mg/dL (ref 0.3–1.2)
Total Protein: 6.8 g/dL (ref 6.5–8.1)

## 2019-12-17 LAB — FERRITIN: Ferritin: 677 ng/mL — ABNORMAL HIGH (ref 11–307)

## 2019-12-17 LAB — D-DIMER, QUANTITATIVE: D-Dimer, Quant: <0.27 ug/mL-FEU (ref 0.00–0.50)

## 2019-12-17 LAB — C-REACTIVE PROTEIN: CRP: 1.1 mg/dL — ABNORMAL HIGH (ref <1.0)

## 2019-12-17 MED ORDER — ENSURE MAX PROTEIN PO LIQD
11.0000 [oz_av] | Freq: Two times a day (BID) | ORAL | Status: DC
  Administered 2019-12-17 – 2019-12-19 (×4): 11 [oz_av] via ORAL
  Filled 2019-12-17 (×12): qty 330

## 2019-12-17 MED ORDER — SALINE SPRAY 0.65 % NA SOLN
1.0000 | NASAL | Status: DC | PRN
  Filled 2019-12-17: qty 44

## 2019-12-17 MED ORDER — METOPROLOL TARTRATE 25 MG PO TABS
25.0000 mg | ORAL_TABLET | Freq: Four times a day (QID) | ORAL | Status: DC
  Administered 2019-12-17 – 2019-12-22 (×17): 25 mg via ORAL
  Filled 2019-12-17 (×19): qty 1

## 2019-12-17 MED ORDER — METHYLPREDNISOLONE SODIUM SUCC 40 MG IJ SOLR
40.0000 mg | Freq: Two times a day (BID) | INTRAMUSCULAR | Status: DC
  Administered 2019-12-17 – 2019-12-22 (×10): 40 mg via INTRAVENOUS
  Filled 2019-12-17 (×10): qty 1

## 2019-12-17 MED ORDER — OXYMETAZOLINE HCL 0.05 % NA SOLN
3.0000 | Freq: Three times a day (TID) | NASAL | Status: AC
  Administered 2019-12-17 – 2019-12-18 (×6): 3 via NASAL
  Filled 2019-12-17: qty 30

## 2019-12-17 NOTE — Progress Notes (Signed)
Inpatient Diabetes Program Recommendations  AACE/ADA: New Consensus Statement on Inpatient Glycemic Control (2015)  Target Ranges:  Prepandial:   less than 140 mg/dL      Peak postprandial:   less than 180 mg/dL (1-2 hours)      Critically ill patients:  140 - 180 mg/dL   Lab Results  Component Value Date   GLUCAP 236 (H) 12/17/2019   HGBA1C 10.8 (H) 2019/12/29    Review of Glycemic Control Results for JESSILYN, CATINO (MRN [id number]) as of 12/16/2019 11:29  Ref. Range 12/15/2019 07:24 12/15/2019 11:54 12/15/2019 14:04 12/15/2019 16:17 12/15/2019 21:13 12/16/2019 02:44 12/16/2019 07:49  Glucose-Capillary Latest Ref Range: 70 - 99 mg/dL 825 (H)  Novolog 4 units  Levemir 34 units  232 (H)  Novolog 7 units 270 (H)  Novolog 18 units 307 (H)  Novolog 15 units + Novolog 18 units given at 1808 301 (H)  Novolog 4 units  Levemir 34 units 230 (H) 286 (H)  Novolog 11 units  Levemir 34 units   Diabetes history: DM 2 Outpatient Diabetes medications: none listed/pt reports metformin Current orders for Inpatient glycemic control:  Levemir 40 units bid Novolog 0-20 units tid + hs Novolog 18 units tid mc Tradjenta 5 mg Daily  A1c 10.8% on 9/3 Solumedrol 50 mg Q12 hours [employer] insurance PCP: Dr. Joya Martyr   -  Consider increasing Levemir to 45 units bid.  Postprandial glucose trends stable no fluctuation between breakfast and lunch today.   D/c planning: Discussed with pt the needs for glucose checks when going home while on potential steroids.  Lantus solostar insulin pen (emailed copay card link for pt) order # [id number] Insulin pen needles order # [id number]   Thanks,  Christena Deem RN, MSN, BC-ADM Inpatient Diabetes Coordinator Team Pager [phone] (8a-5p)

## 2019-12-17 NOTE — Progress Notes (Signed)
Progress Note  Patient Name: Linda Richmond Date of Encounter: 12/17/2019  [facility] Cardiologist: No primary care provider on file.   Due to the COVID-19 pandemic, this visit was completed with telemedicine (audio) technology to reduce patient and provider exposure.  Subjective   She denies any chest pain or palpitations.  Reports dyspnea improving.  Inpatient Medications    Scheduled Meds: . baricitinib  4 mg Oral Daily  . enoxaparin (LOVENOX) injection  120 mg Subcutaneous Q12H  . insulin aspart  0-20 Units Subcutaneous TID WC  . insulin aspart  0-5 Units Subcutaneous QHS  . insulin aspart  18 Units Subcutaneous TID WC  . insulin detemir  40 Units Subcutaneous BID  . Ipratropium-Albuterol  1 puff Inhalation Q6H  . linagliptin  5 mg Oral Daily  . methylPREDNISolone (SOLU-MEDROL) injection  50 mg Intravenous Q12H  . metoprolol tartrate  25 mg Oral TID  . oxymetazoline  3 spray Each Nare TID  . thyroid  30 mg Oral QAC breakfast  . Vitamin D (Ergocalciferol)  50,000 Units Oral Q7 days   Continuous Infusions: . sodium chloride 250 mL (12/12/19 0851)  . diltiazem (CARDIZEM) infusion 10 mg/hr (12/17/19 0938)   PRN Meds: sodium chloride, acetaminophen, ALPRAZolam, chlorpheniramine-HYDROcodone, guaiFENesin-dextromethorphan, metoprolol tartrate, pantoprazole, sodium chloride   Vital Signs    Vitals:   12/17/19 0430 12/17/19 0500 12/17/19 0600 12/17/19 0745  BP: 129/71 110/61 119/76 124/75  Pulse: (!) 55 73 70 94  Resp: 20 18 20 20   Temp: 97.7 F (36.5 C)   98.9 F (37.2 C)  TempSrc: Oral   Axillary  SpO2:    90%  Weight:      Height:        Intake/Output Summary (Last 24 hours) at 12/17/2019 0952 Last data filed at 12/17/2019 0600 Gross per 24 hour  Intake 539.96 ml  Output 125 ml  Net 414.96 ml   Last 3 Weights 01/01/2020 10/25/2016 08/23/2016  Weight (lbs) 262 lb 267 lb 279 lb 3.2 oz  Weight (kg) 118.842 kg 121.11 kg 126.644 kg      Telemetry    AF  70-90s, having pauses up to 2 seconds- Personally Reviewed  ECG    NO new ECG - Personally Reviewed  Physical Exam   GEN: No acute distress.   RESP: able to converse comfortably  Labs    High Sensitivity Troponin:   Recent Labs  Lab 12/16/19 0334 12/16/19 0743  TROPONINIHS 6 6      Chemistry Recent Labs  Lab 12/14/19 0522 12/15/19 0555 12/16/19 0334  NA 141 143 141  K 3.7 4.5 4.1  CL 104 108 105  CO2 24 22 25   GLUCOSE 280* 159* 249*  BUN 18 20 22*  CREATININE 0.67 0.68 0.67  CALCIUM 8.4* 8.3* 8.4*  PROT 6.4* 6.3* 6.7  ALBUMIN 2.7* 2.7* 2.8*  AST 39 37 38  ALT 34 35 40  ALKPHOS 49 46 50  BILITOT 0.7 0.7 0.8  GFRNONAA >60 >60 >60  GFRAA >60 >60 >60  ANIONGAP 13 13 11      Hematology Recent Labs  Lab 12/14/19 0522 12/15/19 0555 12/16/19 0334  WBC 6.2 4.6 11.4*  RBC 5.15* 5.21* 5.62*  HGB 15.6* 15.3* 16.6*  HCT 47.2* 48.0* 50.4*  MCV 91.7 92.1 89.7  MCH 30.3 29.4 29.5  MCHC 33.1 31.9 32.9  RDW 13.0 12.9 12.9  PLT 282 268 375    BNP Recent Labs  Lab 12/15/19 0555  BNP 20.9  DDimer  Recent Labs  Lab 12/14/19 0522 12/15/19 0555 12/16/19 0334  DDIMER <0.27 0.37 0.48     Radiology    ECHOCARDIOGRAM LIMITED  Result Date: 12/16/2019    ECHOCARDIOGRAM LIMITED REPORT   Patient Name:   HEBE MERRIWETHER Date of Exam: 12/16/2019 Medical Rec #:  [id number]     Height:       62.0 in Accession #:    [id number]    Weight:       262.0 lb Date of Birth:  [birth year]      BSA:          2.144 m Patient Age:    54 years      BP:           109/73 mmHg Patient Gender: F             HR:           131 bpm. Exam Location:  Inpatient Procedure: Limited Echo, Limited Color Doppler, Cardiac Doppler and Intracardiac            Opacification Agent Indications:    Acute Hypoxic Respiratory Failure  History:        Patient has prior history of Echocardiogram examinations, most                 recent 09/06/2016. Risk Factors:Hypertension and Diabetes. GERD.  Sonographer:     [name] RDCS Referring Phys: [id number] Deno Lunger [facility] IMPRESSIONS  1. Left ventricular ejection fraction, by estimation, is 60 to 65%. The left ventricle has normal function. The left ventricle has no regional wall motion abnormalities. There is mild left ventricular hypertrophy. Left ventricular diastolic function could not be evaluated.  2. The mitral valve is grossly normal.  3. The aortic valve is tricuspid. Aortic valve regurgitation is not visualized. No aortic stenosis is present. Comparison(s): Prior images unable to be directly viewed, comparison made by report only. Conclusion(s)/Recommendation(s): Otherwise normal echocardiogram, with minor abnormalities described in the report. FINDINGS  Left Ventricle: Left ventricular ejection fraction, by estimation, is 60 to 65%. The left ventricle has normal function. The left ventricle has no regional wall motion abnormalities. Definity contrast agent was given IV to delineate the left ventricular  endocardial borders. There is mild left ventricular hypertrophy. Left ventricular diastolic function could not be evaluated. Left ventricular diastolic function could not be evaluated due to atrial fibrillation. Left Atrium: Left atrial size was normal in size. Right Atrium: Right atrial size was not well visualized. Pericardium: Trivial pericardial effusion is present. Mitral Valve: The mitral valve is grossly normal. Tricuspid Valve: The tricuspid valve is normal in structure. Tricuspid valve regurgitation is trivial. No evidence of tricuspid stenosis. Aortic Valve: The aortic valve is tricuspid. Aortic valve regurgitation is not visualized. No aortic stenosis is present. Pulmonic Valve: The pulmonic valve was not well visualized. Pulmonic valve regurgitation is not visualized. Aorta: The aortic root is normal in size and structure. Venous: The inferior vena cava was not well visualized. IAS/Shunts: The atrial septum is grossly normal. LEFT VENTRICLE PLAX 2D  LVIDd:         4.50 cm LVIDs:         2.80 cm LV PW:         1.10 cm LV IVS:        1.20 cm LVOT diam:     1.80 cm LV SV:         28 LV SV Index:   13 LVOT Area:  2.54 cm  LV Volumes (MOD) LV vol d, MOD A2C: 113.0 ml LV vol d, MOD A4C: 118.0 ml LV vol s, MOD A2C: 53.6 ml LV vol s, MOD A4C: 41.4 ml LV SV MOD A2C:     59.4 ml LV SV MOD A4C:     118.0 ml LV SV MOD BP:      71.7 ml LEFT ATRIUM             Index LA diam:        3.90 cm 1.82 cm/m LA Vol (A2C):   34.2 ml 15.95 ml/m LA Vol (A4C):   23.4 ml 10.91 ml/m LA Biplane Vol: 29.0 ml 13.53 ml/m  AORTIC VALVE LVOT Vmax:   83.60 cm/s LVOT Vmean:  49.400 cm/s LVOT VTI:    0.110 m  AORTA Ao Root diam: 2.90 cm MITRAL VALVE MV Area (PHT): 4.09 cm    SHUNTS MV Decel Time: 186 msec    Systemic VTI:  0.11 m MV E velocity: 72.40 cm/s  Systemic Diam: 1.80 cm Jodelle Red MD Electronically signed by Jodelle Red MD Signature Date/Time: 12/16/2019/9:51:17 PM    Final     Cardiac Studies   Echo 12/16/19: 1. Left ventricular ejection fraction, by estimation, is 60 to 65%. The  left ventricle has normal function. The left ventricle has no regional  wall motion abnormalities. There is mild left ventricular hypertrophy.  Left ventricular diastolic function  could not be evaluated.  2. The mitral valve is grossly normal.  3. The aortic valve is tricuspid. Aortic valve regurgitation is not  visualized. No aortic stenosis is present.   Patient Profile     54 y.o. female with a PMH of HTN, palpitations, DM type 2, hypothyroidism, anxiety, depression, and GERD, who is being seen today for the evaluation of atrial fibrillation   Assessment & Plan    New onset Atrial fibrillation with RVR: patient currently admitted with COVID-19 PNA with ongoing hypoxemia requiring HFNC and NRB. On 12/15/19 around 10:15pm she went into atrial fibrillation with RVR. HsTrop negative x2. TSH wnl. Started on a diltiazem gtt with metoprolol added. HR improved,  predominately in the 80s-100s at this time. Likely driven by underlying infection.   Echo shows normal systolic function -Currently on PO metoprolol 25 mg TID and IV dilt gtt at 10 mg/hr.  Will increase metoprolol to 25 mg QID and titrate off dilt gtt today.  Can plan to consolidate metorpolol to 50 mg BID tomorrow -CHA2DS2-VASc Score  3 (HTN, DM type 2, and Female).  Can continue therapeutic lovenox for anticoagulation, but if no further procedures planned would transition to Eliquis 5 mg BID   COVID-19 PNA: still with severe hypoxemia requiring HFNC and NRB at times. She completed a course of remdesivir. She remains on steroids, baricitinib, and supportive care. Likely driving #1 - Continue management per primary team  HTN: BP stable. ARB on hold to allow room for titration of rate controlling medications - Continue current regimen  DM type 2: poorly controlled with A1C 10.8. Appears to be a new diagnosis this admission. BS exacerbated by steroids this admission. - Continue management per primary team  Hypothyroidism: TSH wnl this admission - Continue armour thyroid  For questions or updates, please contact [facility] Please consult [email or web address] for contact info under        Signed, Little Ishikawa, MD  12/17/2019, 9:52 AM

## 2019-12-17 NOTE — Progress Notes (Signed)
Physical Therapy Treatment Patient Details Name: Linda Richmond MRN: [id number] DOB: 1965/05/16 Today's Date: 12/17/2019    History of Present Illness Pt adm to [facility] 12/28/2019 with acute hypoxic respiratory failure due to covid PNA. Transferred to [facility] on 12/13/19. PMH - DM, RA, morbid obesity.    PT Comments    Pt only on 15L NRB during session. HFNC removed this AM due to nose bleed. Pt very anxious reporting having a panic attack during her bath this AM. She required supervision transfers. Pt performed LE exercises in recliner. Desat to 85% during activity. SpO2 88-92% at rest. Pt in recliner at end of session.    Follow Up Recommendations  No PT follow up     Equipment Recommendations  None recommended by PT    Recommendations for Other Services       Precautions / Restrictions Precautions Precautions: Other (comment) Precaution Comments: watch SpO2, anxious    Mobility  Bed Mobility Overal bed mobility: Modified Independent                Transfers Overall transfer level: Needs assistance Equipment used: None Transfers: Sit to/from Stand;Stand Pivot Transfers Sit to Stand: Supervision Stand pivot transfers: Supervision          Ambulation/Gait                 Stairs             Wheelchair Mobility    Modified Rankin (Stroke Patients Only)       Balance Overall balance assessment: No apparent balance deficits (not formally assessed)                                          Cognition Arousal/Alertness: Awake/alert Behavior During Therapy: WFL for tasks assessed/performed;Anxious Overall Cognitive Status: Within Functional Limits for tasks assessed                                        Exercises General Exercises - Lower Extremity Ankle Circles/Pumps: AROM;Both;20 reps Gluteal Sets: AROM;Both;10 reps Long Arc Quad: AROM;Right;Left;5 reps Heel Slides: AROM;Right;Left;5 reps Hip ABduction/ADduction:  AROM;Both;10 reps Hip Flexion/Marching: AROM;Right;Left;10 reps    General Comments General comments (skin integrity, edema, etc.): Pt on 15L NRB. HFNC had been removed due to nose bleed. SpO2 88-92% at rest. Desat to 85% during activity.      Pertinent Vitals/Pain Pain Assessment: No/denies pain    Home Living                      Prior Function            PT Goals (current goals can now be found in the care plan section) Acute Rehab PT Goals Patient Stated Goal: return home Progress towards PT goals: Progressing toward goals    Frequency    Min 3X/week      PT Plan Current plan remains appropriate    Co-evaluation              AM-PAC PT "6 Clicks" Mobility   Outcome Measure  Help needed turning from your back to your side while in a flat bed without using bedrails?: None Help needed moving from lying on your back to sitting on the side of a flat bed without using bedrails?:  None Help needed moving to and from a bed to a chair (including a wheelchair)?: A Little Help needed standing up from a chair using your arms (e.g., wheelchair or bedside chair)?: A Little Help needed to walk in hospital room?: A Little Help needed climbing 3-5 steps with a railing? : A Little 6 Click Score: 20    End of Session Equipment Utilized During Treatment: Oxygen Activity Tolerance: Other (comment) (anxiety) Patient left: in chair;with call bell/phone within reach Nurse Communication: Mobility status PT Visit Diagnosis: Other abnormalities of gait and mobility (R26.89)     Time: 8338-2505 PT Time Calculation (min) (ACUTE ONLY): 17 min  Charges:  $Therapeutic Exercise: 8-22 mins                     Aida Raider, PT  Office # [phone] Pager [phone]    Ilda Foil 12/17/2019, 1:51 PM

## 2019-12-17 NOTE — Progress Notes (Signed)
PROGRESS NOTE                                                                                                                                                                                                             Patient Demographics:    Linda Richmond, is a 54 y.o. female, DOB - 1965-05-20, BZM:[id number]  Outpatient Primary MD for the patient is Ignatius Specking, MD   Admit date - 04-Jan-2020   LOS - 7  Chief Complaint  Patient presents with  . Shortness of Breath       Brief Narrative: Patient is a 54 y.o. female with PMHx of anxiety, depression, DM-2, HTN, seronegative RA-who tested positive for COVID-19 on 8/27-admitted to [facility] on 9/3 with acute hypoxemic respiratory failure secondary to COVID-19 pneumonia.  Unfortunately hospital course complicated by worsening hypoxemia requiring HFNC.  Transferred to [facility] for further evaluation and treatment.  See below for further details  COVID-19 vaccinated status: Unvaccinated  Significant Events: 8/27>> COVID-19 positive 9/3>> Admit to [facility] for hypoxemia secondary to COVID-19 9/6>> transferred to [facility]-worsening hypoxemia on 15 L of HFNC 9/8>> A. fib with RVR  Significant studies: 9/3>>Chest x-ray: New bilateral mid to lower lung patchy opacities 9/9>> Echo: EF 60-65%  COVID-19 medications: Steroids: 9/3>> Remdesivir: 9/3>> 9/7 Baricitinib: 9/5  Antibiotics: None  Microbiology data: 9/20 >>blood culture: 1/2-Staph epidermidis (likely contaminant)  Procedures: None  Consults: None  DVT prophylaxis: SCDs Start: January 04, 2020 1527 Lovenox at therapeutic dosing    Subjective:   Some minimal epistaxis after blowing nose.  Otherwise no major complaints.   Assessment  & Plan :   Acute Hypoxic Resp Failure due to Covid 19 Viral pneumonia: Continues to have severe hypoxemia-essentially unchanged over the past few days-but not any worse.   Completed a course of remdesivir on 9/7.  Remains on steroids and baricitinib.  Continue to watch closely-she has no signs of volume overload-if she deteriorates significantly-she will require transfer to the ICU.   Fever: afebrile O2 requirements:  SpO2: 92 % O2 Flow Rate (L/min): 13 L/min (15 nrb) FiO2 (%): 94 %   COVID-19 Labs: Recent Labs    12/15/19 0555 12/16/19 0334 12/17/19 1143  DDIMER 0.37 0.48 <0.27  FERRITIN  --  672* 677*  CRP 2.1* 1.2* 1.1*       Component  Value Date/Time   BNP 20.9 12/15/2019 0555    No results for input(s): PROCALCITON in the last 168 hours.  Lab Results  Component Value Date   SARSCOV2NAA POSITIVE (A) 01/05/2020   SARSCOV2NAA Not Detected 04/13/2019   SARSCOV2NAA Not Detected 02/02/2019   SARSCOV2NAA Not Detected 01/22/2019      Prone/Incentive Spirometry: encouraged patient to lie prone for 3-4 hours at a time for a total of 16 hours a day, and to encourage incentive spirometry use 3-4/hour.  Epistaxis: Minimal this morning-removed HFNC-O2 saturations stable on just NRB.  Etiology felt to be due to HFNC/mechanical trauma while blowing nose while on full dose Lovenox.  Start Afrin-avoid mechanical trauma-keep on NRB today-bleeding had already stopped earlier this morning.  Suspect we can cautiously continue with full dose Lovenox given trivial epistaxis-but need to watch closely-if worsens-we will stop Lovenox and consult ENT.  A. fib with RVR: Rate better controlled-but remains in atrial fibrillation-Cardizem gtt. being titrated down-on beta-blocker-cardiology following.  Remains on full dose Lovenox.    DM-2 (A1c 10.8) with steroid induced hypoglycemia: CBGs remain on the higher side-insulin dose adjusted adjusted yesterday-steroids being slightly tapered down-for now continue Levemir 40 units twice daily, 18 units of uncontrolled this morning-increase Levemir to 40 units twice daily, continue 18 units of Premeal NovoLog and resistant SSI.   Reassess tomorrow.   Recent Labs    12/16/19 2026 12/17/19 0743 12/17/19 1148  GLUCAP 310* 231* 236*   HTN: BP controlled with Cardizem gtt. and metoprolol.  No longer on Avapro.  Hypothyroidism: Continue thyroid Armour-TSH within normal limits  GERD: Continue PPI  Positive blood culture-1/2+ for staph epidermidis-likely represents a contaminant-no treatment required.  Nutrition Problem: Nutrition Problem: Increased nutrient needs Etiology: acute illness (COVID-19) Signs/Symptoms: estimated needs Interventions: Glucerna shake, MVI, Premier Protein  Morbid Obesity: Estimated body mass index is 47.92 kg/m as calculated from the following:   Height as of this encounter:  (1.575 m).   Weight as of this encounter: 118.8 kg.    ABG: No results found for: PHART, PCO2ART, PO2ART, HCO3, TCO2, ACIDBASEDEF, O2SAT  Vent Settings:     Condition - Extremely Guarded  Family Communication  :  Daughter Marchelle Folks [phone]) and son Chrissie Noa over the phone on 9/10  Code Status :  Full Code  Diet :  Diet Order            Diet Carb Modified Fluid consistency: Thin; Room service appropriate? Yes  Diet effective now                  Disposition Plan  :   Status is: Inpatient  Remains inpatient appropriate because:Inpatient level of care appropriate due to severity of illness  Dispo: The patient is from: Home              Anticipated d/c is to: Home              Anticipated d/c date is: > 3 days              Patient currently is not medically stable to d/c.   Barriers to discharge: Hypoxia requiring O2 supplementation  Antimicorbials  :    Anti-infectives (From admission, onward)   Start     Dose/Rate Route Frequency Ordered Stop   12/11/19 1000  remdesivir 100 mg in sodium chloride 0.9 % 100 mL IVPB  Status:  Discontinued       "Followed by" Linked Group Details   100 mg  200 mL/hr over 30 Minutes Intravenous Daily 12/31/2019 1529 12/13/2019 1532   12/11/19 1000   remdesivir 100 mg in sodium chloride 0.9 % 100 mL IVPB        100 mg 200 mL/hr over 30 Minutes Intravenous Daily 01/02/2020 1533 12/14/19 0907   01/01/2020 1545  remdesivir 100 mg in sodium chloride 0.9 % 100 mL IVPB        100 mg 200 mL/hr over 30 Minutes Intravenous Every 30 min 12/14/2019 1533 12/08/2019 1850   12/14/2019 1530  remdesivir 200 mg in sodium chloride 0.9% 250 mL IVPB  Status:  Discontinued       "Followed by" Linked Group Details   200 mg 580 mL/hr over 30 Minutes Intravenous Once 12/23/2019 1529 12/21/2019 1532      Inpatient Medications  Scheduled Meds: . baricitinib  4 mg Oral Daily  . enoxaparin (LOVENOX) injection  120 mg Subcutaneous Q12H  . insulin aspart  0-20 Units Subcutaneous TID WC  . insulin aspart  0-5 Units Subcutaneous QHS  . insulin aspart  18 Units Subcutaneous TID WC  . insulin detemir  40 Units Subcutaneous BID  . Ipratropium-Albuterol  1 puff Inhalation Q6H  . linagliptin  5 mg Oral Daily  . methylPREDNISolone (SOLU-MEDROL) injection  50 mg Intravenous Q12H  . metoprolol tartrate  25 mg Oral QID  . oxymetazoline  3 spray Each Nare TID  . thyroid  30 mg Oral QAC breakfast  . Vitamin D (Ergocalciferol)  50,000 Units Oral Q7 days   Continuous Infusions: . sodium chloride 250 mL (12/12/19 0851)  . diltiazem (CARDIZEM) infusion 5 mg/hr (12/17/19 1527)   PRN Meds:.sodium chloride, acetaminophen, ALPRAZolam, chlorpheniramine-HYDROcodone, guaiFENesin-dextromethorphan, metoprolol tartrate, pantoprazole, sodium chloride   Time Spent in minutes 35   See all Orders from today for further details   Jeoffrey Massed M.D on 12/17/2019 at 3:39 PM  To page go to [email or web address] - use universal password  [facility] -  Office  [phone]    Objective:   Vitals:   12/17/19 0745 12/17/19 1150 12/17/19 1250 12/17/19 1353  BP: 124/75 127/72 107/74 114/72  Pulse: 94   (!) 104  Resp: 20 20    Temp: 98.9 F (37.2 C) 98.5 F (36.9 C)    TempSrc: Axillary  Axillary    SpO2: 90% 92%    Weight:      Height:        Wt Readings from Last 3 Encounters:  12/29/2019 118.8 kg  10/25/16 121.1 kg  08/23/16 126.6 kg     Intake/Output Summary (Last 24 hours) at 12/17/2019 1539 Last data filed at 12/17/2019 1108 Gross per 24 hour  Intake 539.96 ml  Output --  Net 539.96 ml     Physical Exam Gen Exam:Alert awake-not in any distress HEENT:atraumatic, normocephalic Chest: B/L clear to auscultation anteriorly CVS:S1S2 irregular Abdomen:soft non tender, non distended Extremities:no edema Neurology: Non focal Skin: no rash   Data Review:    CBC Recent Labs  Lab 12/11/19 0851 12/11/19 0851 12/12/19 0755 12/14/19 0522 12/15/19 0555 12/16/19 0334 12/17/19 1143  WBC 2.0*   < > 2.6* 6.2 4.6 11.4* 10.5  HGB 15.3*   < > 16.8* 15.6* 15.3* 16.6* 17.1*  HCT 47.7*   < > 53.0* 47.2* 48.0* 50.4* 53.0*  PLT 194   < > 210 282 268 375 402*  MCV 93.2   < > 95.0 91.7 92.1 89.7 91.1  MCH 29.9   < > 30.1 30.3 29.4 29.5 29.4  MCHC 32.1   < > 31.7 33.1 31.9 32.9 32.3  RDW 13.3   < > 13.2 13.0 12.9 12.9 13.0  LYMPHSABS 0.6*  --  0.8 0.6* 0.6*  --   --   MONOABS 0.3  --  0.3 0.5 0.3  --   --   EOSABS 0.0  --  0.0 0.0 0.0  --   --   BASOSABS 0.0  --  0.0 0.0 0.0  --   --    < > = values in this interval not displayed.    Chemistries  Recent Labs  Lab 12/12/19 0755 12/14/19 0522 12/15/19 0555 12/16/19 0334 12/17/19 1143  NA 137 141 143 141 140  K 4.1 3.7 4.5 4.1 4.2  CL 104 104 108 105 102  CO2 19* 24 22 25 26   GLUCOSE 297* 280* 159* 249* 254*  BUN 17 18 20  22* 21*  CREATININE 0.65 0.67 0.68 0.67 0.82  CALCIUM 8.4* 8.4* 8.3* 8.4* 8.5*  AST 36 39 37 38 37  ALT 30 34 35 40 44  ALKPHOS 50 49 46 50 57  BILITOT 0.6 0.7 0.7 0.8 0.7   ------------------------------------------------------------------------------------------------------------------ No results for input(s): CHOL, HDL, LDLCALC, TRIG, CHOLHDL, LDLDIRECT in the last 72  hours.  Lab Results  Component Value Date   HGBA1C 10.8 (H) 12/12/2019   ------------------------------------------------------------------------------------------------------------------ Recent Labs    12/16/19 0334  TSH 3.917   ------------------------------------------------------------------------------------------------------------------ Recent Labs    12/16/19 0334 12/17/19 1143  FERRITIN 672* 677*    Coagulation profile No results for input(s): INR, PROTIME in the last 168 hours.  Recent Labs    12/16/19 0334 12/17/19 1143  DDIMER 0.48 <0.27    Cardiac Enzymes No results for input(s): CKMB, TROPONINI, MYOGLOBIN in the last 168 hours.  Invalid input(s): CK ------------------------------------------------------------------------------------------------------------------    Component Value Date/Time   BNP 20.9 12/15/2019 0555    Micro Results Recent Results (from the past 240 hour(s))  SARS Coronavirus 2 by RT PCR (hospital order, performed in [facility] hospital lab) Nasopharyngeal Nasopharyngeal Swab     Status: Abnormal   Collection Time: 12/21/2019  3:32 PM   Specimen: Nasopharyngeal Swab  Result Value Ref Range Status   SARS Coronavirus 2 POSITIVE (A) NEGATIVE Final    Comment: RESULT CALLED TO, READ BACK BY AND VERIFIED WITH: TALBOTT,T @ 1846 ON 12/30/2019 BY JUW (NOTE) SARS-CoV-2 target nucleic acids are DETECTED  SARS-CoV-2 RNA is generally detectable in upper respiratory specimens  during the acute phase of infection.  Positive results are indicative  of the presence of the identified virus, but do not rule out bacterial infection or co-infection with other pathogens not detected by the test.  Clinical correlation with patient history and  other diagnostic information is necessary to determine patient infection status.  The expected result is negative.  Fact Sheet for Patients:   02/09/20   Fact Sheet for  Healthcare Providers:   02/09/20    This test is not yet approved or cleared by the [employer] FDA and  has been authorized for detection and/or diagnosis of SARS-CoV-2 by FDA under an Emergency Use Authorization (EUA).  This EUA will remain in effect (meaning this tes t can be used) for the duration of  the COVID-19 declaration under Section 564(b)(1) of the Act, 21 U.S.C. section 360-bbb-3(b)(1), unless the authorization is terminated or revoked sooner.  Performed at [facility], [address], [place], [address] [place]   Blood Culture (routine x 2)  Status: Abnormal   Collection Time: 12/12/19  3:43 PM   Specimen: Right Antecubital; Blood  Result Value Ref Range Status   Specimen Description   Final    RIGHT ANTECUBITAL Performed at [facility], [address], [place], [place] [address]    Special Requests   Final    BOTTLES DRAWN AEROBIC AND ANAEROBIC Blood Culture adequate volume Performed at [facility], [address] [place], [place] [address]    Culture  Setup Time   Final    GRAM POSITIVE COCCI ANAEROBIC BOTTLE ONLY Gram Stain Report Called to,Read Back By and Verified With: BAYNES @ 1220 ON [id number] BY HENDERSON L. CRITICAL RESULT CALLED TO, READ BACK BY AND VERIFIED WITH: RN BRANDY BAYNES 1836 [phone] FCP    Culture (A)  Final    STAPHYLOCOCCUS EPIDERMIDIS THE SIGNIFICANCE OF ISOLATING THIS ORGANISM FROM A SINGLE SET OF BLOOD CULTURES WHEN MULTIPLE SETS ARE DRAWN IS UNCERTAIN. PLEASE NOTIFY THE MICROBIOLOGY DEPARTMENT WITHIN ONE WEEK IF SPECIATION AND SENSITIVITIES ARE REQUIRED. Performed at [facility], [address], [place], [place] [address]    Report Status 12/13/2019 FINAL  Final  Blood Culture ID Panel (Reflexed)     Status: Abnormal   Collection Time: 12-12-2019  3:43 PM  Result Value Ref Range Status   Enterococcus faecalis NOT DETECTED NOT DETECTED Final   Enterococcus Faecium NOT DETECTED NOT DETECTED Final    Listeria monocytogenes NOT DETECTED NOT DETECTED Final   Staphylococcus species DETECTED (A) NOT DETECTED Final    Comment: CRITICAL RESULT CALLED TO, READ BACK BY AND VERIFIED WITH: RN BRANDY BAYNES 1836 [phone] FCP    Staphylococcus aureus (BCID) NOT DETECTED NOT DETECTED Final   Staphylococcus epidermidis DETECTED (A) NOT DETECTED Final    Comment: CRITICAL RESULT CALLED TO, READ BACK BY AND VERIFIED WITH: RN BRANDY BAYNES 1836 [phone] FCP    Staphylococcus lugdunensis NOT DETECTED NOT DETECTED Final   Streptococcus species NOT DETECTED NOT DETECTED Final   Streptococcus agalactiae NOT DETECTED NOT DETECTED Final   Streptococcus pneumoniae NOT DETECTED NOT DETECTED Final   Streptococcus pyogenes NOT DETECTED NOT DETECTED Final   A.calcoaceticus-baumannii NOT DETECTED NOT DETECTED Final   Bacteroides fragilis NOT DETECTED NOT DETECTED Final   Enterobacterales NOT DETECTED NOT DETECTED Final   Enterobacter cloacae complex NOT DETECTED NOT DETECTED Final   Escherichia coli NOT DETECTED NOT DETECTED Final   Klebsiella aerogenes NOT DETECTED NOT DETECTED Final   Klebsiella oxytoca NOT DETECTED NOT DETECTED Final   Klebsiella pneumoniae NOT DETECTED NOT DETECTED Final   Proteus species NOT DETECTED NOT DETECTED Final   Salmonella species NOT DETECTED NOT DETECTED Final   Serratia marcescens NOT DETECTED NOT DETECTED Final   Haemophilus influenzae NOT DETECTED NOT DETECTED Final   Neisseria meningitidis NOT DETECTED NOT DETECTED Final   Pseudomonas aeruginosa NOT DETECTED NOT DETECTED Final   Stenotrophomonas maltophilia NOT DETECTED NOT DETECTED Final   Candida albicans NOT DETECTED NOT DETECTED Final   Candida auris NOT DETECTED NOT DETECTED Final   Candida glabrata NOT DETECTED NOT DETECTED Final   Candida krusei NOT DETECTED NOT DETECTED Final   Candida parapsilosis NOT DETECTED NOT DETECTED Final   Candida tropicalis NOT DETECTED NOT DETECTED Final   Cryptococcus  neoformans/gattii NOT DETECTED NOT DETECTED Final   Methicillin resistance mecA/C NOT DETECTED NOT DETECTED Final    Comment: Performed at [facility], [address] [place], [place] [address]  Blood Culture (routine x 2)  Status: None   Collection Time: 12/16/2019  3:44 PM   Specimen: Left Antecubital; Blood  Result Value Ref Range Status   Specimen Description LEFT ANTECUBITAL  Final   Special Requests   Final    BOTTLES DRAWN AEROBIC ONLY Blood Culture results may not be optimal due to an inadequate volume of blood received in culture bottles   Culture   Final    NO GROWTH 5 DAYS Performed at [facility], [address], [place], [place] [address]    Report Status 12/15/2019 FINAL  Final    Radiology Reports DG Chest Port 1 View  Result Date: 12/14/2019 CLINICAL DATA:  Shortness of breath, COVID. EXAM: PORTABLE CHEST 1 VIEW COMPARISON:  Prior chest radiographs 12/23/2019 and earlier. FINDINGS: Shallow inspiration radiograph. The cardiomediastinal silhouette is unchanged. Similar to the prior examination of 12/23/2019, there are interstitial and ill-defined airspace opacities within the left greater than right mid to lower lung fields. No definite pleural effusion. No evidence of pneumothorax. No acute bony abnormality identified. IMPRESSION: Shallow inspiration radiograph. Unchanged appearance of interstitial and ill-defined airspace opacities within the left greater than right mid-to-lower lung fields. These findings likely reflect multifocal pneumonia given the provided history of COVID positivity. Electronically Signed   By: Jackey Loge DO   On: 12/14/2019 08:20   DG Chest Port 1 View  Result Date: 12/11/2019 CLINICAL DATA:  COVID positive EXAM: PORTABLE CHEST 1 VIEW COMPARISON:  04/19/2019 chest radiograph and prior. FINDINGS: Hypoinflated lungs. Bilateral mid to lower lung patchy opacities, new since prior exam. No pneumothorax. Trace bilateral effusions. Cardiomediastinal  silhouette within normal limits. IMPRESSION: New bilateral mid to lower lung patchy opacities. Hypoinflated lungs. Electronically Signed   By: Stana Bunting M.D.   On: 12/21/2019 13:57   ECHOCARDIOGRAM LIMITED  Result Date: 12/16/2019    ECHOCARDIOGRAM LIMITED REPORT   Patient Name:   ANAMARIA STATZ Date of Exam: 12/16/2019 Medical Rec #:  [id number]     Height:       62.0 in Accession #:    [id number]    Weight:       262.0 lb Date of Birth:  1965/09/17      BSA:          2.144 m Patient Age:    54 years      BP:           109/73 mmHg Patient Gender: F             HR:           131 bpm. Exam Location:  Inpatient Procedure: Limited Echo, Limited Color Doppler, Cardiac Doppler and Intracardiac            Opacification Agent Indications:    Acute Hypoxic Respiratory Failure  History:        Patient has prior history of Echocardiogram examinations, most                 recent 09/06/2016. Risk Factors:Hypertension and Diabetes. GERD.  Sonographer:    [name] RDCS Referring Phys: [id number] Deno Lunger [facility] IMPRESSIONS  1. Left ventricular ejection fraction, by estimation, is 60 to 65%. The left ventricle has normal function. The left ventricle has no regional wall motion abnormalities. There is mild left ventricular hypertrophy. Left ventricular diastolic function could not be evaluated.  2. The mitral valve is grossly normal.  3. The aortic valve is tricuspid. Aortic valve regurgitation is not visualized. No aortic stenosis is present. Comparison(s): Prior images  unable to be directly viewed, comparison made by report only. Conclusion(s)/Recommendation(s): Otherwise normal echocardiogram, with minor abnormalities described in the report. FINDINGS  Left Ventricle: Left ventricular ejection fraction, by estimation, is 60 to 65%. The left ventricle has normal function. The left ventricle has no regional wall motion abnormalities. Definity contrast agent was given IV to delineate the left ventricular  endocardial  borders. There is mild left ventricular hypertrophy. Left ventricular diastolic function could not be evaluated. Left ventricular diastolic function could not be evaluated due to atrial fibrillation. Left Atrium: Left atrial size was normal in size. Right Atrium: Right atrial size was not well visualized. Pericardium: Trivial pericardial effusion is present. Mitral Valve: The mitral valve is grossly normal. Tricuspid Valve: The tricuspid valve is normal in structure. Tricuspid valve regurgitation is trivial. No evidence of tricuspid stenosis. Aortic Valve: The aortic valve is tricuspid. Aortic valve regurgitation is not visualized. No aortic stenosis is present. Pulmonic Valve: The pulmonic valve was not well visualized. Pulmonic valve regurgitation is not visualized. Aorta: The aortic root is normal in size and structure. Venous: The inferior vena cava was not well visualized. IAS/Shunts: The atrial septum is grossly normal. LEFT VENTRICLE PLAX 2D LVIDd:         4.50 cm LVIDs:         2.80 cm LV PW:         1.10 cm LV IVS:        1.20 cm LVOT diam:     1.80 cm LV SV:         28 LV SV Index:   13 LVOT Area:     2.54 cm  LV Volumes (MOD) LV vol d, MOD A2C: 113.0 ml LV vol d, MOD A4C: 118.0 ml LV vol s, MOD A2C: 53.6 ml LV vol s, MOD A4C: 41.4 ml LV SV MOD A2C:     59.4 ml LV SV MOD A4C:     118.0 ml LV SV MOD BP:      71.7 ml LEFT ATRIUM             Index LA diam:        3.90 cm 1.82 cm/m LA Vol (A2C):   34.2 ml 15.95 ml/m LA Vol (A4C):   23.4 ml 10.91 ml/m LA Biplane Vol: 29.0 ml 13.53 ml/m  AORTIC VALVE LVOT Vmax:   83.60 cm/s LVOT Vmean:  49.400 cm/s LVOT VTI:    0.110 m  AORTA Ao Root diam: 2.90 cm MITRAL VALVE MV Area (PHT): 4.09 cm    SHUNTS MV Decel Time: 186 msec    Systemic VTI:  0.11 m MV E velocity: 72.40 cm/s  Systemic Diam: 1.80 cm Jodelle Red MD Electronically signed by Jodelle Red MD Signature Date/Time: 12/16/2019/9:51:17 PM    Final

## 2019-12-17 NOTE — Progress Notes (Signed)
Nutrition Follow-up  DOCUMENTATION CODES:   Morbid obesity  INTERVENTION:  Provide Ensure Max po BID, each supplement provides 150 kcal and 30 grams of protein.   Encourage adequate PO intake.   NUTRITION DIAGNOSIS:   Increased nutrient needs related to acute illness (COVID-19) as evidenced by estimated needs; ongoing  GOAL:   Patient will meet greater than or equal to 90% of their needs; progressing  MONITOR:   PO intake, Supplement acceptance, Labs, Weight trends, Skin, I & O's  REASON FOR ASSESSMENT:   Malnutrition Screening Tool    ASSESSMENT:   Linda Richmond  is a 54 y.o. female, with past medical history of anxiety, depression, diabetes, GERD, hypertension, vitamin D deficiency, negative rheumatoid arthritis, she presents to ED secondary to shortness of breath, reported began yesterday, patient reports for last week she started to have Covid symptoms including generalized body ache, headache, runny nose, nasal congestion, she was tested positive for COVID-19 on 8/27, prescribed Z-Pak and prednisone, she has finished both without much improvement, reports she started to have dyspnea yesterday, her son is admitted to [facility] due to COVID-19 as well, patient is unvaccinated, patient denies any chest pain, hemoptysis.  Pt continues to have severe hypoxemia. Pt is currently on 13 L non-rebreather mask. Meal completion has been varied from 25-100%, however most recent intake has improved at 100% intake at meals. RD to order Ensure Max to aid in increased caloric and protein needs.   Labs and medications reviewed.   Diet Order:   Diet Order            Diet Carb Modified Fluid consistency: Thin; Room service appropriate? Yes  Diet effective now                 EDUCATION NEEDS:   No education needs have been identified at this time  Skin:  Skin Assessment: Reviewed RN Assessment  Last BM:  9/10  Height:   Ht Readings from Last 1 Encounters:  Dec 23, 2019 5'  2" (1.575 m)    Weight:   Wt Readings from Last 1 Encounters:  December 23, 2019 118.8 kg   BMI:  Body mass index is 47.92 kg/m.  Estimated Nutritional Needs:   Kcal:  1800-2000  Protein:  110-125 grams  Fluid:  > 1.8 L  Roslyn Smiling, MS, RD, LDN RD pager number/after hours weekend pager number on Amion.

## 2019-12-18 DIAGNOSIS — I480 Paroxysmal atrial fibrillation: Secondary | ICD-10-CM

## 2019-12-18 LAB — CBC
HCT: 56.1 % — ABNORMAL HIGH (ref 36.0–46.0)
Hemoglobin: 18.2 g/dL — ABNORMAL HIGH (ref 12.0–15.0)
MCH: 29.4 pg (ref 26.0–34.0)
MCHC: 32.4 g/dL (ref 30.0–36.0)
MCV: 90.6 fL (ref 80.0–100.0)
Platelets: 377 10*3/uL (ref 150–400)
RBC: 6.19 MIL/uL — ABNORMAL HIGH (ref 3.87–5.11)
RDW: 12.8 % (ref 11.5–15.5)
WBC: 9.6 10*3/uL (ref 4.0–10.5)
nRBC: 0 % (ref 0.0–0.2)

## 2019-12-18 LAB — COMPREHENSIVE METABOLIC PANEL
ALT: 52 U/L — ABNORMAL HIGH (ref 0–44)
AST: 52 U/L — ABNORMAL HIGH (ref 15–41)
Albumin: 2.8 g/dL — ABNORMAL LOW (ref 3.5–5.0)
Alkaline Phosphatase: 55 U/L (ref 38–126)
Anion gap: 15 (ref 5–15)
BUN: 22 mg/dL — ABNORMAL HIGH (ref 6–20)
CO2: 20 mmol/L — ABNORMAL LOW (ref 22–32)
Calcium: 8.2 mg/dL — ABNORMAL LOW (ref 8.9–10.3)
Chloride: 104 mmol/L (ref 98–111)
Creatinine, Ser: 0.72 mg/dL (ref 0.44–1.00)
GFR calc Af Amer: >60 mL/min (ref >60)
GFR calc non Af Amer: >60 mL/min (ref >60)
Glucose, Bld: 155 mg/dL — ABNORMAL HIGH (ref 70–99)
Potassium: 3.8 mmol/L (ref 3.5–5.1)
Sodium: 139 mmol/L (ref 135–145)
Total Bilirubin: 0.9 mg/dL (ref 0.3–1.2)
Total Protein: 6.6 g/dL (ref 6.5–8.1)

## 2019-12-18 LAB — GLUCOSE, CAPILLARY
Glucose-Capillary: 175 mg/dL — ABNORMAL HIGH (ref 70–99)
Glucose-Capillary: 147 mg/dL — ABNORMAL HIGH (ref 70–99)
Glucose-Capillary: 165 mg/dL — ABNORMAL HIGH (ref 70–99)
Glucose-Capillary: 189 mg/dL — ABNORMAL HIGH (ref 70–99)
Glucose-Capillary: 156 mg/dL — ABNORMAL HIGH (ref 70–99)

## 2019-12-18 LAB — D-DIMER, QUANTITATIVE: D-Dimer, Quant: 0.37 ug/mL-FEU (ref 0.00–0.50)

## 2019-12-18 LAB — C-REACTIVE PROTEIN: CRP: 0.7 mg/dL (ref <1.0)

## 2019-12-18 MED ORDER — DIGOXIN 125 MCG PO TABS
0.1250 mg | ORAL_TABLET | Freq: Every day | ORAL | Status: DC
  Administered 2019-12-18 – 2019-12-23 (×6): 0.125 mg via ORAL
  Filled 2019-12-18 (×6): qty 1

## 2019-12-18 NOTE — Progress Notes (Signed)
ANTICOAGULATION CONSULT NOTE   Pharmacy Consult for Lovenox  Indication: atrial fibrillation  Allergies  Allergen Reactions  . Ancef [Cefazolin] Hives  . Aspirin Other (See Comments)    GI Bleed  . Doxycycline Diarrhea and Nausea And Vomiting  . Sulfa Antibiotics Hives    Possible loss of consciousness  . Ciprofloxacin Rash    "feels like bugs are crawling in my head"  . Lesinurad-Allopurinol Nausea And Vomiting  . Penicillins Rash    Patient Measurements: Height: 5\' 2"  (157.5 cm) Weight: 111.7 kg (246 lb 4.1 oz) IBW/kg (Calculated) : 50.1  Vital Signs: Temp: 97.7 F (36.5 C) (09/11 0754) Temp Source: Oral (09/11 0754) BP: 96/53 (09/11 0754) Pulse Rate: 74 (09/11 0754)  Labs: Recent Labs    12/16/19 0334 12/16/19 0334 12/16/19 0743 12/17/19 1143 12/18/19 0410  HGB 16.6*   < >  --  17.1* 18.2*  HCT 50.4*  --   --  53.0* 56.1*  PLT 375  --   --  402* 377  CREATININE 0.67  --   --  0.82 0.72  TROPONINIHS 6  --  6  --   --    < > = values in this interval not displayed.    Estimated Creatinine Clearance: 92.6 mL/min (by C-G formula based on SCr of 0.72 mg/dL).   Medical History: Past Medical History:  Diagnosis Date  . Anxiety   . Depression   . Diabetes mellitus without complication (HCC)   . GERD (gastroesophageal reflux disease)   . Hypertension   . Hypothyroidism   . Palpitations   . Rheumatoid arthritis (HCC)     Medications:  Scheduled:  . baricitinib  4 mg Oral Daily  . enoxaparin (LOVENOX) injection  120 mg Subcutaneous Q12H  . insulin aspart  0-20 Units Subcutaneous TID WC  . insulin aspart  0-5 Units Subcutaneous QHS  . insulin aspart  18 Units Subcutaneous TID WC  . insulin detemir  40 Units Subcutaneous BID  . Ipratropium-Albuterol  1 puff Inhalation Q6H  . linagliptin  5 mg Oral Daily  . methylPREDNISolone (SOLU-MEDROL) injection  40 mg Intravenous Q12H  . metoprolol tartrate  25 mg Oral QID  . oxymetazoline  3 spray Each Nare TID   . Ensure Max Protein  11 oz Oral BID  . thyroid  30 mg Oral QAC breakfast  . Vitamin D (Ergocalciferol)  50,000 Units Oral Q7 days    Assessment: 54 y.o. female admitted with acute hypoxemic respiratory failure secondary to COVID-19 pneumonia found to have new onset Afib. Pharmacy consulted to dose full dose Lovenox.   Today, her Hgb is 18.2, Plt 377. No signs of overt bleeding noted. Patient remains in afib at this time. Plan to transition to Eliquis once respiratory status more stable.   Goal of Therapy:  Full anticoagulation with Lovenox Monitor platelets by anticoagulation protocol: Yes   Plan:  Continue Lovenox 120 mg SQ q12h  Monitor daily CBC Watch for s/sx of bleeding F/u transition to Eliquis   57, PharmD PGY1 Acute Care Pharmacy Resident 12/18/2019 8:22 AM  Please check [email or web address] for unit-specific pharmacy phone numbers.

## 2019-12-18 NOTE — Progress Notes (Signed)
PROGRESS NOTE                                                                                                                                                                                                             Patient Demographics:    Linda Richmond, is a 54 y.o. female, DOB - 04-May-1965, RAQ:[id number]  Outpatient Primary MD for the patient is Ignatius Specking, MD   Admit date - 12/15/2019   LOS - 8  Chief Complaint  Patient presents with  . Shortness of Breath       Brief Narrative: Patient is a 54 y.o. female with PMHx of anxiety, depression, DM-2, HTN, seronegative RA-who tested positive for COVID-19 on 8/27-admitted to [facility] on 9/3 with acute hypoxemic respiratory failure secondary to COVID-19 pneumonia.  Unfortunately hospital course complicated by worsening hypoxemia requiring HFNC.  Transferred to [facility] for further evaluation and treatment.  See below for further details  COVID-19 vaccinated status: Unvaccinated  Significant Events: 8/27>> COVID-19 positive 9/3>> Admit to [facility] for hypoxemia secondary to COVID-19 9/6>> transferred to [facility]-worsening hypoxemia on 15 L of HFNC 9/8>> A. fib with RVR  Significant studies: 9/3>>Chest x-ray: New bilateral mid to lower lung patchy opacities 9/9>> Echo: EF 60-65%  COVID-19 medications: Steroids: 9/3>> Remdesivir: 9/3>> 9/7 Baricitinib: 9/5  Antibiotics: None  Microbiology data: 9/20 >>blood culture: 1/2-Staph epidermidis (likely contaminant)  Procedures: None  Consults: None  DVT prophylaxis: SCDs Start: 01/05/2020 1527 Lovenox at therapeutic dosing    Subjective:   Stable-some minimal epistaxis persists-tolerating NRB well-not on HFNC.   Assessment  & Plan :   Acute Hypoxic Resp Failure due to Covid 19 Viral pneumonia: Continues to have severe hypoxemia-stable on NRB-not on HFNC since 9/10 due to mild epistaxis.  Remains  on steroids and baricitinib.  No signs of volume overload.  Continue to watch closely-suspect that we can continue NRB today and avoid HFNC in order to promote further nasal mucosal injury.  Continue to monitor closely-if she worsens significantly-she will need to be transferred to the ICU.    Fever: afebrile O2 requirements:  SpO2: 98 % O2 Flow Rate (L/min): 15 L/min FiO2 (%): 94 %   COVID-19 Labs: Recent Labs    12/16/19 0334 12/17/19 1143 12/18/19 0410 12/18/19 0654  DDIMER 0.48 <0.27  --  0.37  FERRITIN 672* 677*  --   --   CRP 1.2* 1.1* 0.7  --        Component Value Date/Time   BNP 20.9 12/15/2019 0555    No results for input(s): PROCALCITON in the last 168 hours.  Lab Results  Component Value Date   SARSCOV2NAA POSITIVE (A) 12/28/2019   SARSCOV2NAA Not Detected 04/13/2019   SARSCOV2NAA Not Detected 02/02/2019   SARSCOV2NAA Not Detected 01/22/2019      Prone/Incentive Spirometry: encouraged patient to lie prone for 3-4 hours at a time for a total of 16 hours a day, and to encourage incentive spirometry use 3-4/hour.  Epistaxis: Some mild epistaxis this morning-mostly on examination of her nasal cavity (right nare more than left).  Etiology of epistaxis felt to be mechanical, due to blowing nose and being on full dose anticoagulation.  Continue Afrin nasal spray-tolerating NRB well-avoid HFNC if for now.  Suspect since it is very minimal-we can continue therapeutic Lovenox-reassess tomorrow.  Continue to watch closely-if epistaxis markedly worsens-we will need to stop Lovenox and consult ENT.    A. fib with RVR: Rate better controlled-but remains in atrial fibrillation-Cardizem gtt. being titrated down-on beta-blocker-cardiology following.  Remains on full dose Lovenox.    DM-2 (A1c 10.8) with steroid induced hypoglycemia: CBGs remain stable-continue Levemir 40 units twice daily, 18 units of NovoLog with meals and resistant SSI.  Follow and adjust.   Recent Labs     12/18/19 0258 12/18/19 0731 12/18/19 1147  GLUCAP 175* 147* 165*   HTN: BP controlled with Cardizem gtt. and metoprolol.  No longer on Avapro.  Hypothyroidism: Continue thyroid Armour-TSH within normal limits  GERD: Continue PPI  Positive blood culture-1/2+ for staph epidermidis-likely represents a contaminant-no treatment required.  Nutrition Problem: Nutrition Problem: Increased nutrient needs Etiology: acute illness (COVID-19) Signs/Symptoms: estimated needs Interventions: Glucerna shake, MVI, Premier Protein  Morbid Obesity: Estimated body mass index is 45.04 kg/m as calculated from the following:   Height as of this encounter: 5\' 2"  (1.575 m).   Weight as of this encounter: 111.7 kg.    ABG: No results found for: PHART, PCO2ART, PO2ART, HCO3, TCO2, ACIDBASEDEF, O2SAT  Vent Settings:     Condition - Extremely Guarded  Family Communication  :  Daughter Marchelle Folks [phone]) phone on 9/11  Code Status :  Full Code  Diet :  Diet Order            Diet Carb Modified Fluid consistency: Thin; Room service appropriate? Yes  Diet effective now                  Disposition Plan  :   Status is: Inpatient  Remains inpatient appropriate because:Inpatient level of care appropriate due to severity of illness  Dispo: The patient is from: Home              Anticipated d/c is to: Home              Anticipated d/c date is: > 3 days              Patient currently is not medically stable to d/c.   Barriers to discharge: Hypoxia requiring O2 supplementation  Antimicorbials  :    Anti-infectives (From admission, onward)   Start     Dose/Rate Route Frequency Ordered Stop   12/11/19 1000  remdesivir 100 mg in sodium chloride 0.9 % 100 mL IVPB  Status:  Discontinued       "Followed by" Linked  Group Details   100 mg 200 mL/hr over 30 Minutes Intravenous Daily 2019-12-16 1529 December 16, 2019 1532   12/11/19 1000  remdesivir 100 mg in sodium chloride 0.9 % 100 mL IVPB        100  mg 200 mL/hr over 30 Minutes Intravenous Daily 16-Dec-2019 1533 12/14/19 0907   2019-12-16 1545  remdesivir 100 mg in sodium chloride 0.9 % 100 mL IVPB        100 mg 200 mL/hr over 30 Minutes Intravenous Every 30 min 12/16/19 1533 Dec 16, 2019 1850   2019/12/16 1530  remdesivir 200 mg in sodium chloride 0.9% 250 mL IVPB  Status:  Discontinued       "Followed by" Linked Group Details   200 mg 580 mL/hr over 30 Minutes Intravenous Once 12/16/2019 1529 12/16/19 1532      Inpatient Medications  Scheduled Meds: . baricitinib  4 mg Oral Daily  . digoxin  0.125 mg Oral Daily  . enoxaparin (LOVENOX) injection  120 mg Subcutaneous Q12H  . insulin aspart  0-20 Units Subcutaneous TID WC  . insulin aspart  0-5 Units Subcutaneous QHS  . insulin aspart  18 Units Subcutaneous TID WC  . insulin detemir  40 Units Subcutaneous BID  . Ipratropium-Albuterol  1 puff Inhalation Q6H  . linagliptin  5 mg Oral Daily  . methylPREDNISolone (SOLU-MEDROL) injection  40 mg Intravenous Q12H  . metoprolol tartrate  25 mg Oral QID  . oxymetazoline  3 spray Each Nare TID  . Ensure Max Protein  11 oz Oral BID  . thyroid  30 mg Oral QAC breakfast  . Vitamin D (Ergocalciferol)  50,000 Units Oral Q7 days   Continuous Infusions: . sodium chloride 250 mL (12/12/19 0851)  . diltiazem (CARDIZEM) infusion 7.5 mg/hr (12/18/19 0756)   PRN Meds:.sodium chloride, acetaminophen, ALPRAZolam, chlorpheniramine-HYDROcodone, guaiFENesin-dextromethorphan, metoprolol tartrate, pantoprazole, sodium chloride   Time Spent in minutes 35   See all Orders from today for further details   Jeoffrey Massed M.D on 12/18/2019 at 1:31 PM  To page go to [email or web address] - use universal password  [facility] -  Office  [phone]    Objective:   Vitals:   12/18/19 0754 12/18/19 0843 12/18/19 0900 12/18/19 1201  BP: (!) 96/53  98/60 109/83  Pulse: 74 (!) 130 95 70  Resp:   20 20  Temp: 97.7 F (36.5 C)  97.8 F (36.6 C)   TempSrc:  Oral  Oral   SpO2: 99%   98%  Weight:      Height:        Wt Readings from Last 3 Encounters:  12/18/19 111.7 kg  10/25/16 121.1 kg  08/23/16 126.6 kg     Intake/Output Summary (Last 24 hours) at 12/18/2019 1331 Last data filed at 12/18/2019 1016 Gross per 24 hour  Intake 480 ml  Output --  Net 480 ml     Physical Exam Gen Exam:Alert awake-not in any distress HEENT:atraumatic, normocephalic Chest: B/L clear to auscultation anteriorly CVS:S1S2 regular Abdomen:soft non tender, non distended Extremities:no edema Neurology: Non focal Skin: no rash    Data Review:    CBC Recent Labs  Lab 12/12/19 0755 12/12/19 0755 12/14/19 0522 12/15/19 0555 12/16/19 0334 12/17/19 1143 12/18/19 0410  WBC 2.6*   < > 6.2 4.6 11.4* 10.5 9.6  HGB 16.8*   < > 15.6* 15.3* 16.6* 17.1* 18.2*  HCT 53.0*   < > 47.2* 48.0* 50.4* 53.0* 56.1*  PLT 210   < > 282 268 375 402*  377  MCV 95.0   < > 91.7 92.1 89.7 91.1 90.6  MCH 30.1   < > 30.3 29.4 29.5 29.4 29.4  MCHC 31.7   < > 33.1 31.9 32.9 32.3 32.4  RDW 13.2   < > 13.0 12.9 12.9 13.0 12.8  LYMPHSABS 0.8  --  0.6* 0.6*  --   --   --   MONOABS 0.3  --  0.5 0.3  --   --   --   EOSABS 0.0  --  0.0 0.0  --   --   --   BASOSABS 0.0  --  0.0 0.0  --   --   --    < > = values in this interval not displayed.    Chemistries  Recent Labs  Lab 12/14/19 0522 12/15/19 0555 12/16/19 0334 12/17/19 1143 12/18/19 0410  NA 141 143 141 140 139  K 3.7 4.5 4.1 4.2 3.8  CL 104 108 105 102 104  CO2 24 22 25 26  20*  GLUCOSE 280* 159* 249* 254* 155*  BUN 18 20 22* 21* 22*  CREATININE 0.67 0.68 0.67 0.82 0.72  CALCIUM 8.4* 8.3* 8.4* 8.5* 8.2*  AST 39 37 38 37 52*  ALT 34 35 40 44 52*  ALKPHOS 49 46 50 57 55  BILITOT 0.7 0.7 0.8 0.7 0.9   ------------------------------------------------------------------------------------------------------------------ No results for input(s): CHOL, HDL, LDLCALC, TRIG, CHOLHDL, LDLDIRECT in the last 72  hours.  Lab Results  Component Value Date   HGBA1C 10.8 (H) 08-Jan-2020   ------------------------------------------------------------------------------------------------------------------ Recent Labs    12/16/19 0334  TSH 3.917   ------------------------------------------------------------------------------------------------------------------ Recent Labs    12/16/19 0334 12/17/19 1143  FERRITIN 672* 677*    Coagulation profile No results for input(s): INR, PROTIME in the last 168 hours.  Recent Labs    12/17/19 1143 12/18/19 0654  DDIMER <0.27 0.37    Cardiac Enzymes No results for input(s): CKMB, TROPONINI, MYOGLOBIN in the last 168 hours.  Invalid input(s): CK ------------------------------------------------------------------------------------------------------------------    Component Value Date/Time   BNP 20.9 12/15/2019 0555    Micro Results Recent Results (from the past 240 hour(s))  SARS Coronavirus 2 by RT PCR (hospital order, performed in [facility] hospital lab) Nasopharyngeal Nasopharyngeal Swab     Status: Abnormal   Collection Time: 2020-01-08  3:32 PM   Specimen: Nasopharyngeal Swab  Result Value Ref Range Status   SARS Coronavirus 2 POSITIVE (A) NEGATIVE Final    Comment: RESULT CALLED TO, READ BACK BY AND VERIFIED WITH: TALBOTT,T @ 1846 ON 01-08-20 BY JUW (NOTE) SARS-CoV-2 target nucleic acids are DETECTED  SARS-CoV-2 RNA is generally detectable in upper respiratory specimens  during the acute phase of infection.  Positive results are indicative  of the presence of the identified virus, but do not rule out bacterial infection or co-infection with other pathogens not detected by the test.  Clinical correlation with patient history and  other diagnostic information is necessary to determine patient infection status.  The expected result is negative.  Fact Sheet for Patients:   BoilerBrush.com.cy   Fact Sheet for  Healthcare Providers:   https://pope.com/    This test is not yet approved or cleared by the [place] FDA and  has been authorized for detection and/or diagnosis of SARS-CoV-2 by FDA under an Emergency Use Authorization (EUA).  This EUA will remain in effect (meaning this tes t can be used) for the duration of  the COVID-19 declaration under Section 564(b)(1) of the Act,  21 U.S.C. section 360-bbb-3(b)(1), unless the authorization is terminated or revoked sooner.  Performed at [facility], [address], [place], [place] [address]   Blood Culture (routine x 2)     Status: Abnormal   Collection Time: 01/04/2020  3:43 PM   Specimen: Right Antecubital; Blood  Result Value Ref Range Status   Specimen Description   Final    RIGHT ANTECUBITAL Performed at [facility], [address], [place], [place] [address]    Special Requests   Final    BOTTLES DRAWN AEROBIC AND ANAEROBIC Blood Culture adequate volume Performed at [facility], [address], [place], [place] [address]    Culture  Setup Time   Final    GRAM POSITIVE COCCI ANAEROBIC BOTTLE ONLY Gram Stain Report Called to,Read Back By and Verified With: BAYNES @ 1220 ON [id number] BY HENDERSON L. CRITICAL RESULT CALLED TO, READ BACK BY AND VERIFIED WITH: RN BRANDY BAYNES 1836 [phone] FCP    Culture (A)  Final    STAPHYLOCOCCUS EPIDERMIDIS THE SIGNIFICANCE OF ISOLATING THIS ORGANISM FROM A SINGLE SET OF BLOOD CULTURES WHEN MULTIPLE SETS ARE DRAWN IS UNCERTAIN. PLEASE NOTIFY THE MICROBIOLOGY DEPARTMENT WITHIN ONE WEEK IF SPECIATION AND SENSITIVITIES ARE REQUIRED. Performed at [facility], [address], [place], [place] [address]    Report Status 12/13/2019 FINAL  Final  Blood Culture ID Panel (Reflexed)     Status: Abnormal   Collection Time: 12/21/2019  3:43 PM  Result Value Ref Range Status   Enterococcus faecalis NOT DETECTED NOT DETECTED Final   Enterococcus Faecium NOT DETECTED NOT DETECTED Final    Listeria monocytogenes NOT DETECTED NOT DETECTED Final   Staphylococcus species DETECTED (A) NOT DETECTED Final    Comment: CRITICAL RESULT CALLED TO, READ BACK BY AND VERIFIED WITH: RN BRANDY BAYNES 1836 [phone] FCP    Staphylococcus aureus (BCID) NOT DETECTED NOT DETECTED Final   Staphylococcus epidermidis DETECTED (A) NOT DETECTED Final    Comment: CRITICAL RESULT CALLED TO, READ BACK BY AND VERIFIED WITH: RN BRANDY BAYNES 1836 [phone] FCP    Staphylococcus lugdunensis NOT DETECTED NOT DETECTED Final   Streptococcus species NOT DETECTED NOT DETECTED Final   Streptococcus agalactiae NOT DETECTED NOT DETECTED Final   Streptococcus pneumoniae NOT DETECTED NOT DETECTED Final   Streptococcus pyogenes NOT DETECTED NOT DETECTED Final   A.calcoaceticus-baumannii NOT DETECTED NOT DETECTED Final   Bacteroides fragilis NOT DETECTED NOT DETECTED Final   Enterobacterales NOT DETECTED NOT DETECTED Final   Enterobacter cloacae complex NOT DETECTED NOT DETECTED Final   Escherichia coli NOT DETECTED NOT DETECTED Final   Klebsiella aerogenes NOT DETECTED NOT DETECTED Final   Klebsiella oxytoca NOT DETECTED NOT DETECTED Final   Klebsiella pneumoniae NOT DETECTED NOT DETECTED Final   Proteus species NOT DETECTED NOT DETECTED Final   Salmonella species NOT DETECTED NOT DETECTED Final   Serratia marcescens NOT DETECTED NOT DETECTED Final   Haemophilus influenzae NOT DETECTED NOT DETECTED Final   Neisseria meningitidis NOT DETECTED NOT DETECTED Final   Pseudomonas aeruginosa NOT DETECTED NOT DETECTED Final   Stenotrophomonas maltophilia NOT DETECTED NOT DETECTED Final   Candida albicans NOT DETECTED NOT DETECTED Final   Candida auris NOT DETECTED NOT DETECTED Final   Candida glabrata NOT DETECTED NOT DETECTED Final   Candida krusei NOT DETECTED NOT DETECTED Final   Candida parapsilosis NOT DETECTED NOT DETECTED Final   Candida tropicalis NOT DETECTED NOT DETECTED Final   Cryptococcus  neoformans/gattii NOT DETECTED NOT DETECTED Final  Methicillin resistance mecA/C NOT DETECTED NOT DETECTED Final    Comment: Performed at [facility], [address], [place], [place] [address]  Blood Culture (routine x 2)     Status: None   Collection Time: 12/13/2019  3:44 PM   Specimen: Left Antecubital; Blood  Result Value Ref Range Status   Specimen Description LEFT ANTECUBITAL  Final   Special Requests   Final    BOTTLES DRAWN AEROBIC ONLY Blood Culture results may not be optimal due to an inadequate volume of blood received in culture bottles   Culture   Final    NO GROWTH 5 DAYS Performed at [facility], [address], [place], [place] [address]    Report Status 12/15/2019 FINAL  Final    Radiology Reports DG Chest Port 1 View  Result Date: 12/14/2019 CLINICAL DATA:  Shortness of breath, COVID. EXAM: PORTABLE CHEST 1 VIEW COMPARISON:  Prior chest radiographs 12/11/2019 and earlier. FINDINGS: Shallow inspiration radiograph. The cardiomediastinal silhouette is unchanged. Similar to the prior examination of 12/26/2019, there are interstitial and ill-defined airspace opacities within the left greater than right mid to lower lung fields. No definite pleural effusion. No evidence of pneumothorax. No acute bony abnormality identified. IMPRESSION: Shallow inspiration radiograph. Unchanged appearance of interstitial and ill-defined airspace opacities within the left greater than right mid-to-lower lung fields. These findings likely reflect multifocal pneumonia given the provided history of COVID positivity. Electronically Signed   By: Jackey Loge DO   On: 12/14/2019 08:20   DG Chest Port 1 View  Result Date: 01/04/2020 CLINICAL DATA:  COVID positive EXAM: PORTABLE CHEST 1 VIEW COMPARISON:  04/19/2019 chest radiograph and prior. FINDINGS: Hypoinflated lungs. Bilateral mid to lower lung patchy opacities, new since prior exam. No pneumothorax. Trace bilateral effusions. Cardiomediastinal  silhouette within normal limits. IMPRESSION: New bilateral mid to lower lung patchy opacities. Hypoinflated lungs. Electronically Signed   By: Stana Bunting M.D.   On: 01/02/2020 13:57   ECHOCARDIOGRAM LIMITED  Result Date: 12/16/2019    ECHOCARDIOGRAM LIMITED REPORT   Patient Name:   RONNEISHA JETT Date of Exam: 12/16/2019 Medical Rec #:  [id number]     Height:       62.0 in Accession #:    [id number]    Weight:       262.0 lb Date of Birth:  25-Dec-1965      BSA:          2.144 m Patient Age:    54 years      BP:           109/73 mmHg Patient Gender: F             HR:           131 bpm. Exam Location:  Inpatient Procedure: Limited Echo, Limited Color Doppler, Cardiac Doppler and Intracardiac            Opacification Agent Indications:    Acute Hypoxic Respiratory Failure  History:        Patient has prior history of Echocardiogram examinations, most                 recent 09/06/2016. Risk Factors:Hypertension and Diabetes. GERD.  Sonographer:    [name] RDCS Referring Phys: [id number] Deno Lunger [facility] IMPRESSIONS  1. Left ventricular ejection fraction, by estimation, is 60 to 65%. The left ventricle has normal function. The left ventricle has no regional wall motion abnormalities. There is mild left ventricular hypertrophy. Left ventricular  diastolic function could not be evaluated.  2. The mitral valve is grossly normal.  3. The aortic valve is tricuspid. Aortic valve regurgitation is not visualized. No aortic stenosis is present. Comparison(s): Prior images unable to be directly viewed, comparison made by report only. Conclusion(s)/Recommendation(s): Otherwise normal echocardiogram, with minor abnormalities described in the report. FINDINGS  Left Ventricle: Left ventricular ejection fraction, by estimation, is 60 to 65%. The left ventricle has normal function. The left ventricle has no regional wall motion abnormalities. Definity contrast agent was given IV to delineate the left ventricular  endocardial  borders. There is mild left ventricular hypertrophy. Left ventricular diastolic function could not be evaluated. Left ventricular diastolic function could not be evaluated due to atrial fibrillation. Left Atrium: Left atrial size was normal in size. Right Atrium: Right atrial size was not well visualized. Pericardium: Trivial pericardial effusion is present. Mitral Valve: The mitral valve is grossly normal. Tricuspid Valve: The tricuspid valve is normal in structure. Tricuspid valve regurgitation is trivial. No evidence of tricuspid stenosis. Aortic Valve: The aortic valve is tricuspid. Aortic valve regurgitation is not visualized. No aortic stenosis is present. Pulmonic Valve: The pulmonic valve was not well visualized. Pulmonic valve regurgitation is not visualized. Aorta: The aortic root is normal in size and structure. Venous: The inferior vena cava was not well visualized. IAS/Shunts: The atrial septum is grossly normal. LEFT VENTRICLE PLAX 2D LVIDd:         4.50 cm LVIDs:         2.80 cm LV PW:         1.10 cm LV IVS:        1.20 cm LVOT diam:     1.80 cm LV SV:         28 LV SV Index:   13 LVOT Area:     2.54 cm  LV Volumes (MOD) LV vol d, MOD A2C: 113.0 ml LV vol d, MOD A4C: 118.0 ml LV vol s, MOD A2C: 53.6 ml LV vol s, MOD A4C: 41.4 ml LV SV MOD A2C:     59.4 ml LV SV MOD A4C:     118.0 ml LV SV MOD BP:      71.7 ml LEFT ATRIUM             Index LA diam:        3.90 cm 1.82 cm/m LA Vol (A2C):   34.2 ml 15.95 ml/m LA Vol (A4C):   23.4 ml 10.91 ml/m LA Biplane Vol: 29.0 ml 13.53 ml/m  AORTIC VALVE LVOT Vmax:   83.60 cm/s LVOT Vmean:  49.400 cm/s LVOT VTI:    0.110 m  AORTA Ao Root diam: 2.90 cm MITRAL VALVE MV Area (PHT): 4.09 cm    SHUNTS MV Decel Time: 186 msec    Systemic VTI:  0.11 m MV E velocity: 72.40 cm/s  Systemic Diam: 1.80 cm Jodelle Red MD Electronically signed by Jodelle Red MD Signature Date/Time: 12/16/2019/9:51:17 PM    Final

## 2019-12-18 NOTE — Progress Notes (Signed)
Primary Cardiologist:  Branch   Subjective:  Does not note PAF Taking breathing Rx BP soft This am in afib rates 110-130 bpm  Objective:  Vitals:   12/17/19 2025 12/17/19 2353 12/18/19 0416 12/18/19 0754  BP: (!) 125/100 127/79 120/84 (!) 96/53  Pulse: (!) 103 82 74 74  Resp: 18 20 20    Temp: 97.9 F (36.6 C) 98 F (36.7 C) 97.9 F (36.6 C) 97.7 F (36.5 C)  TempSrc: Axillary Axillary Axillary Oral  SpO2: 95% 93% 94% 99%  Weight:   111.7 kg   Height:        Intake/Output from previous day:  Intake/Output Summary (Last 24 hours) at 12/18/2019 02/17/2020 Last data filed at 12/18/2019 0421 Gross per 24 hour  Intake 480 ml  Output --  Net 480 ml    Physical Exam: Affect appropriate Overweight white female sitting in chair  HEENT: normal Neck supple with no adenopathy JVP normal no bruits no thyromegaly Lungs clear with no wheezing and good diaphragmatic motion Heart:  S1/S2 no murmur, no rub, gallop or click PMI normal Abdomen: benighn, BS positve, no tenderness, no AAA no bruit.  No HSM or HJR Distal pulses intact with no bruits No edema Neuro non-focal Skin warm and dry No muscular weakness   Lab Results: Basic Metabolic Panel: Recent Labs    12/17/19 1143 12/18/19 0410  NA 140 139  K 4.2 3.8  CL 102 104  CO2 26 20*  GLUCOSE 254* 155*  BUN 21* 22*  CREATININE 0.82 0.72  CALCIUM 8.5* 8.2*   Liver Function Tests: Recent Labs    12/17/19 1143 12/18/19 0410  AST 37 52*  ALT 44 52*  ALKPHOS 57 55  BILITOT 0.7 0.9  PROT 6.8 6.6  ALBUMIN 2.8* 2.8*   No results for input(s): LIPASE, AMYLASE in the last 72 hours. CBC: Recent Labs    12/17/19 1143 12/18/19 0410  WBC 10.5 9.6  HGB 17.1* 18.2*  HCT 53.0* 56.1*  MCV 91.1 90.6  PLT 402* 377   Cardiac Enzymes: No results for input(s): CKTOTAL, CKMB, CKMBINDEX, TROPONINI in the last 72 hours. BNP: Invalid input(s): POCBNP D-Dimer: Recent Labs    12/17/19 1143 12/18/19 0654  DDIMER <0.27 0.37    Hemoglobin A1C: No results for input(s): HGBA1C in the last 72 hours. Fasting Lipid Panel: No results for input(s): CHOL, HDL, LDLCALC, TRIG, CHOLHDL, LDLDIRECT in the last 72 hours. Thyroid Function Tests: Recent Labs    12/16/19 0334  TSH 3.917   Anemia Panel: Recent Labs    12/17/19 1143  FERRITIN 677*    Imaging: ECHOCARDIOGRAM LIMITED  Result Date: 12/16/2019    ECHOCARDIOGRAM LIMITED REPORT   Patient Name:   Linda Richmond Date of Exam: 12/16/2019 Medical Rec #:  02/15/2020     Height:       62.0 in Accession #:    [id number]    Weight:       262.0 lb Date of Birth:  03/27/1966      BSA:          2.144 m Patient Age:    54 years      BP:           109/73 mmHg Patient Gender: F             HR:           131 bpm. Exam Location:  Inpatient Procedure: Limited Echo, Limited Color Doppler, Cardiac Doppler and Intracardiac  Opacification Agent Indications:    Acute Hypoxic Respiratory Failure  History:        Patient has prior history of Echocardiogram examinations, most                 recent 09/06/2016. Risk Factors:Hypertension and Diabetes. GERD.  Sonographer:    [name] RDCS Referring Phys: [id number] [facility]o [facility] [place] IMPRESSIONS  1. Left ventricular ejection fraction, by estimation, is 60 to 65%. The left ventricle has normal function. The left ventricle has no regional wall motion abnormalities. There is mild left ventricular hypertrophy. Left ventricular diastolic function could not be evaluated.  2. The mitral valve is grossly normal.  3. The aortic valve is tricuspid. Aortic valve regurgitation is not visualized. No aortic stenosis is present. Comparison(s): Prior images unable to be directly viewed, comparison made by report only. Conclusion(s)/Recommendation(s): Otherwise normal echocardiogram, with minor abnormalities described in the report. FINDINGS  Left Ventricle: Left ventricular ejection fraction, by estimation, is 60 to 65%. The left ventricle has normal function.  The left ventricle has no regional wall motion abnormalities. Definity contrast agent was given IV to delineate the left ventricular  endocardial borders. There is mild left ventricular hypertrophy. Left ventricular diastolic function could not be evaluated. Left ventricular diastolic function could not be evaluated due to atrial fibrillation. Left Atrium: Left atrial size was normal in size. Right Atrium: Right atrial size was not well visualized. Pericardium: Trivial pericardial effusion is present. Mitral Valve: The mitral valve is grossly normal. Tricuspid Valve: The tricuspid valve is normal in structure. Tricuspid valve regurgitation is trivial. No evidence of tricuspid stenosis. Aortic Valve: The aortic valve is tricuspid. Aortic valve regurgitation is not visualized. No aortic stenosis is present. Pulmonic Valve: The pulmonic valve was not well visualized. Pulmonic valve regurgitation is not visualized. Aorta: The aortic root is normal in size and structure. Venous: The inferior vena cava was not well visualized. IAS/Shunts: The atrial septum is grossly normal. LEFT VENTRICLE PLAX 2D LVIDd:         4.50 cm LVIDs:         2.80 cm LV PW:         1.10 cm LV IVS:        1.20 cm LVOT diam:     1.80 cm LV SV:         28 LV SV Index:   13 LVOT Area:     2.54 cm  LV Volumes (MOD) LV vol d, MOD A2C: 113.0 ml LV vol d, MOD A4C: 118.0 ml LV vol s, MOD A2C: 53.6 ml LV vol s, MOD A4C: 41.4 ml LV SV MOD A2C:     59.4 ml LV SV MOD A4C:     118.0 ml LV SV MOD BP:      71.7 ml LEFT ATRIUM             Index LA diam:        3.90 cm 1.82 cm/m LA Vol (A2C):   34.2 ml 15.95 ml/m LA Vol (A4C):   23.4 ml 10.91 ml/m LA Biplane Vol: 29.0 ml 13.53 ml/m  AORTIC VALVE LVOT Vmax:   83.60 cm/s LVOT Vmean:  49.400 cm/s LVOT VTI:    0.110 m  AORTA Ao Root diam: 2.90 cm MITRAL VALVE MV Area (PHT): 4.09 cm    SHUNTS MV Decel Time: 186 msec    Systemic VTI:  0.11 m MV E velocity: 72.40 cm/s  Systemic Diam: 1.80 cm Jodelle Red  MD  Electronically signed by Jodelle Red MD Signature Date/Time: 12/16/2019/9:51:17 PM    Final     Cardiac Studies:  ECG: afib   Telemetry:  afib see above   Echo: EF 60-65%   Medications:   . baricitinib  4 mg Oral Daily  . enoxaparin (LOVENOX) injection  120 mg Subcutaneous Q12H  . insulin aspart  0-20 Units Subcutaneous TID WC  . insulin aspart  0-5 Units Subcutaneous QHS  . insulin aspart  18 Units Subcutaneous TID WC  . insulin detemir  40 Units Subcutaneous BID  . Ipratropium-Albuterol  1 puff Inhalation Q6H  . linagliptin  5 mg Oral Daily  . methylPREDNISolone (SOLU-MEDROL) injection  40 mg Intravenous Q12H  . metoprolol tartrate  25 mg Oral QID  . oxymetazoline  3 spray Each Nare TID  . Ensure Max Protein  11 oz Oral BID  . thyroid  30 mg Oral QAC breakfast  . Vitamin D (Ergocalciferol)  50,000 Units Oral Q7 days     . sodium chloride 250 mL (12/12/19 0851)  . diltiazem (CARDIZEM) infusion 7.5 mg/hr (12/18/19 0756)    Assessment/Plan:   1. Afib:  BP soft limits AV nodal drugs. On lopressor q 6  And cardizem drip had mild epistaxis with full dose lovenox on board. Add digoxin today and see if this helps Transition to eliquis when more stable from respiratory status She has no LAE on echo so hopefully will convert when hypoxemia / COVID improves CXR 9/7 still with significant bilateral consolidations   Charlton Haws 12/18/2019, 8:07 AM

## 2019-12-19 DIAGNOSIS — E119 Type 2 diabetes mellitus without complications: Secondary | ICD-10-CM

## 2019-12-19 LAB — CBC
HCT: 52.9 % — ABNORMAL HIGH (ref 36.0–46.0)
Hemoglobin: 17.4 g/dL — ABNORMAL HIGH (ref 12.0–15.0)
MCH: 29.3 pg (ref 26.0–34.0)
MCHC: 32.9 g/dL (ref 30.0–36.0)
MCV: 89.1 fL (ref 80.0–100.0)
Platelets: 415 10*3/uL — ABNORMAL HIGH (ref 150–400)
RBC: 5.94 MIL/uL — ABNORMAL HIGH (ref 3.87–5.11)
RDW: 12.7 % (ref 11.5–15.5)
WBC: 11.7 10*3/uL — ABNORMAL HIGH (ref 4.0–10.5)
nRBC: 0 % (ref 0.0–0.2)

## 2019-12-19 LAB — COMPREHENSIVE METABOLIC PANEL
ALT: 65 U/L — ABNORMAL HIGH (ref 0–44)
AST: 55 U/L — ABNORMAL HIGH (ref 15–41)
Albumin: 2.7 g/dL — ABNORMAL LOW (ref 3.5–5.0)
Alkaline Phosphatase: 56 U/L (ref 38–126)
Anion gap: 12 (ref 5–15)
BUN: 20 mg/dL (ref 6–20)
CO2: 24 mmol/L (ref 22–32)
Calcium: 8.2 mg/dL — ABNORMAL LOW (ref 8.9–10.3)
Chloride: 104 mmol/L (ref 98–111)
Creatinine, Ser: 0.81 mg/dL (ref 0.44–1.00)
GFR calc Af Amer: >60 mL/min (ref >60)
GFR calc non Af Amer: >60 mL/min (ref >60)
Glucose, Bld: 97 mg/dL (ref 70–99)
Potassium: 3.4 mmol/L — ABNORMAL LOW (ref 3.5–5.1)
Sodium: 140 mmol/L (ref 135–145)
Total Bilirubin: 1.1 mg/dL (ref 0.3–1.2)
Total Protein: 6.4 g/dL — ABNORMAL LOW (ref 6.5–8.1)

## 2019-12-19 LAB — GLUCOSE, CAPILLARY
Glucose-Capillary: 80 mg/dL (ref 70–99)
Glucose-Capillary: 80 mg/dL (ref 70–99)
Glucose-Capillary: 83 mg/dL (ref 70–99)
Glucose-Capillary: 122 mg/dL — ABNORMAL HIGH (ref 70–99)
Glucose-Capillary: 108 mg/dL — ABNORMAL HIGH (ref 70–99)

## 2019-12-19 LAB — C-REACTIVE PROTEIN: CRP: 0.6 mg/dL (ref <1.0)

## 2019-12-19 LAB — D-DIMER, QUANTITATIVE: D-Dimer, Quant: 0.27 ug/mL-FEU (ref 0.00–0.50)

## 2019-12-19 MED ORDER — INSULIN DETEMIR 100 UNIT/ML ~~LOC~~ SOLN
28.0000 [IU] | Freq: Two times a day (BID) | SUBCUTANEOUS | Status: DC
  Administered 2019-12-19: 28 [IU] via SUBCUTANEOUS
  Filled 2019-12-19 (×3): qty 0.28

## 2019-12-19 MED ORDER — INSULIN ASPART 100 UNIT/ML ~~LOC~~ SOLN
8.0000 [IU] | Freq: Three times a day (TID) | SUBCUTANEOUS | Status: DC
  Administered 2019-12-19: 8 [IU] via SUBCUTANEOUS

## 2019-12-19 MED ORDER — POTASSIUM CHLORIDE CRYS ER 20 MEQ PO TBCR
40.0000 meq | EXTENDED_RELEASE_TABLET | Freq: Once | ORAL | Status: AC
  Administered 2019-12-19: 40 meq via ORAL
  Filled 2019-12-19: qty 2

## 2019-12-19 NOTE — Progress Notes (Signed)
Attempted to put the patient on just the high flow nasal cannula today. When the patient was switched, her oxygen started to drop and went down to about 77%. The patient said she could not tolerate the nasal cannula quite yet. I switched her back to the NRB mask and her saturation went back up to 90%.

## 2019-12-19 NOTE — Progress Notes (Signed)
PROGRESS NOTE                                                                                                                                                                                                             Patient Demographics:    Linda Richmond, is a 54 y.o. female, DOB - [birth year], ZOX:[id number]  Outpatient Primary MD for the patient is Ignatius Specking, MD   Admit date - 12/30/2019   LOS - 9  Chief Complaint  Patient presents with  . Shortness of Breath       Brief Narrative: Patient is a 54 y.o. female with PMHx of anxiety, depression, DM-2, HTN, seronegative RA-who tested positive for COVID-19 on 8/27-admitted to [facility] on 9/3 with acute hypoxemic respiratory failure secondary to COVID-19 pneumonia.  Unfortunately hospital course complicated by worsening hypoxemia requiring HFNC.  Transferred to [facility] for further evaluation and treatment.  See below for further details  COVID-19 vaccinated status: Unvaccinated  Significant Events: 8/27>> COVID-19 positive 9/3>> Admit to [facility] for hypoxemia secondary to COVID-19 9/6>> transferred to [facility]-worsening hypoxemia on 15 L of HFNC 9/8>> A. fib with RVR  Significant studies: 9/3>>Chest x-ray: New bilateral mid to lower lung patchy opacities 9/9>> Echo: EF 60-65%  COVID-19 medications: Steroids: 9/3>> Remdesivir: 9/3>> 9/7 Baricitinib: 9/5  Antibiotics: None  Microbiology data: 9/20 >>blood culture: 1/2-Staph epidermidis (likely contaminant)  Procedures: None  Consults: Cardiology  DVT prophylaxis: SCDs Start: 01/03/2020 1527 Lovenox at therapeutic dosing    Subjective:   Reports good night sleep, no epistaxis, tolerating NRB, unable to tolerate HFNC .   Assessment  & Plan :   Acute Hypoxic Resp Failure due to Covid 19 Viral pneumonia:  - Continues to have severe hypoxemia-stable on NRB-not on HFNC since 9/10 due to mild  epistaxis.   -Remains on steroids and baricitinib.   - No signs of volume overload.   - Continue to watch closely-suspect that we can continue NRB today and avoid HFNC in order to promote further nasal mucosal injury.  Continue to monitor closely-if she worsens significantly-she will need to be transferred to the ICU.    Fever: afebrile O2 requirements:  SpO2: 98 % O2 Flow Rate (L/min): 15 L/min FiO2 (%): 94 %   COVID-19 Labs: Recent Labs    12/17/19 1143 12/18/19 0410 12/18/19  0654 12/19/19 0434  DDIMER <0.27  --  0.37 0.27  FERRITIN 677*  --   --   --   CRP 1.1* 0.7  --  0.6       Component Value Date/Time   BNP 20.9 12/15/2019 0555    No results for input(s): PROCALCITON in the last 168 hours.  Lab Results  Component Value Date   SARSCOV2NAA POSITIVE (A) 12/09/2019   SARSCOV2NAA Not Detected 04/13/2019   SARSCOV2NAA Not Detected 02/02/2019   SARSCOV2NAA Not Detected 01/22/2019      Prone/Incentive Spirometry: encouraged patient to lie prone for 3-4 hours at a time for a total of 16 hours a day, and to encourage incentive spirometry use 3-4/hour.  Epistaxis:  -This appears to be improving, she cannot tolerate HFNC, continue with NRB which has been helping with epistaxis as well  -if epistaxis markedly worsens-we will need to stop Lovenox and consult ENT.    A. fib with RVR:  -Management per cardiology, she remains on Cardizem drip, beta-blockers, she was started on digoxin as well . - Remains on full dose Lovenox.  Transition to Eliquis once epistaxis has resolved.  DM-2 (A1c 10.8) with steroid induced hypoglycemia:  -I have decreased her Levemir to 38 units twice daily, and decreased her NovoLog from 18 to 6 units before meals, continue with sliding scale .  Recent Labs    12/19/19 0349 12/19/19 0729 12/19/19 1153  GLUCAP 80 80 83   HTN: BP controlled with Cardizem gtt. and metoprolol.  No longer on Avapro.  Hypothyroidism: Continue thyroid Armour-TSH  within normal limits  GERD: Continue PPI  Positive blood culture-1/2+ for staph epidermidis-likely represents a contaminant-no treatment required.  Nutrition Problem: Nutrition Problem: Increased nutrient needs Etiology: acute illness (COVID-19) Signs/Symptoms: estimated needs Interventions: Glucerna shake, MVI, Premier Protein  Morbid Obesity: Estimated body mass index is 46.41 kg/m as calculated from the following:   Height as of this encounter: 5\' 2"  (1.575 m).   Weight as of this encounter: 115.1 kg.    ABG: No results found for: PHART, PCO2ART, PO2ART, HCO3, TCO2, ACIDBASEDEF, O2SAT  Vent Settings:     Condition - Extremely Guarded  Family Communication  :  Daughter [phone]) phone on 9/12  Code Status :  Full Code  Diet :  Diet Order            Diet Carb Modified Fluid consistency: Thin; Room service appropriate? Yes  Diet effective now                  Disposition Plan  :   Status is: Inpatient  Remains inpatient appropriate because:Inpatient level of care appropriate due to severity of illness  Dispo: The patient is from: Home              Anticipated d/c is to: Home              Anticipated d/c date is: > 3 days              Patient currently is not medically stable to d/c.   Barriers to discharge: Hypoxia requiring O2 supplementation  Antimicorbials  :    Anti-infectives (From admission, onward)   Start     Dose/Rate Route Frequency Ordered Stop   12/11/19 1000  remdesivir 100 mg in sodium chloride 0.9 % 100 mL IVPB  Status:  Discontinued       "Followed by" Linked Group Details   100 mg 200 mL/hr over  30 Minutes Intravenous Daily 01-09-2020 1529 January 09, 2020 1532   12/11/19 1000  remdesivir 100 mg in sodium chloride 0.9 % 100 mL IVPB        100 mg 200 mL/hr over 30 Minutes Intravenous Daily 01-09-2020 1533 12/14/19 0907   2020-01-09 1545  remdesivir 100 mg in sodium chloride 0.9 % 100 mL IVPB        100 mg 200 mL/hr over 30 Minutes  Intravenous Every 30 min Jan 09, 2020 1533 01-09-20 1850   January 09, 2020 1530  remdesivir 200 mg in sodium chloride 0.9% 250 mL IVPB  Status:  Discontinued       "Followed by" Linked Group Details   200 mg 580 mL/hr over 30 Minutes Intravenous Once Jan 09, 2020 1529 2020/01/09 1532      Inpatient Medications  Scheduled Meds: . baricitinib  4 mg Oral Daily  . digoxin  0.125 mg Oral Daily  . enoxaparin (LOVENOX) injection  120 mg Subcutaneous Q12H  . insulin aspart  0-20 Units Subcutaneous TID WC  . insulin aspart  0-5 Units Subcutaneous QHS  . insulin aspart  8 Units Subcutaneous TID WC  . insulin detemir  28 Units Subcutaneous BID  . Ipratropium-Albuterol  1 puff Inhalation Q6H  . linagliptin  5 mg Oral Daily  . methylPREDNISolone (SOLU-MEDROL) injection  40 mg Intravenous Q12H  . metoprolol tartrate  25 mg Oral QID  . Ensure Max Protein  11 oz Oral BID  . thyroid  30 mg Oral QAC breakfast  . Vitamin D (Ergocalciferol)  50,000 Units Oral Q7 days   Continuous Infusions: . sodium chloride 250 mL (12/12/19 0851)  . diltiazem (CARDIZEM) infusion 7.5 mg/hr (12/18/19 2200)   PRN Meds:.sodium chloride, acetaminophen, ALPRAZolam, chlorpheniramine-HYDROcodone, guaiFENesin-dextromethorphan, metoprolol tartrate, pantoprazole, sodium chloride   Time Spent in minutes 35   See all Orders from today for further details   Huey Bienenstock M.D on 12/19/2019 at 3:15 PM  To page go to [email or web address] - use universal password  [facility] -  Office  [phone]    Objective:   Vitals:   12/19/19 0356 12/19/19 0500 12/19/19 0744 12/19/19 1205  BP: 114/83  135/89 121/87  Pulse: 79  82 77  Resp: 20     Temp: 98.2 F (36.8 C)  (!) 97.4 F (36.3 C) 97.7 F (36.5 C)  TempSrc: Axillary  Oral Oral  SpO2: 94%  95% 98%  Weight:  115.1 kg    Height:        Wt Readings from Last 3 Encounters:  12/19/19 115.1 kg  10/25/16 121.1 kg  08/23/16 126.6 kg     Intake/Output Summary (Last 24 hours)  at 12/19/2019 1515 Last data filed at 12/19/2019 0719 Gross per 24 hour  Intake 240 ml  Output 500 ml  Net -260 ml     Physical Exam Awake Alert, Oriented X 3, No new F.N deficits, Normal affect Symmetrical Chest wall movement, Good air movement bilaterally, CTAB Irregular irregular,No Gallops,Rubs or new Murmurs, No Parasternal Heave +ve B.Sounds, Abd Soft, No tenderness, No rebound - guarding or rigidity. No Cyanosis, Clubbing or edema, No new Rash or bruise      Data Review:    CBC Recent Labs  Lab 12/14/19 0522 12/14/19 0522 12/15/19 0555 12/16/19 0334 12/17/19 1143 12/18/19 0410 12/19/19 0434  WBC 6.2   < > 4.6 11.4* 10.5 9.6 11.7*  HGB 15.6*   < > 15.3* 16.6* 17.1* 18.2* 17.4*  HCT 47.2*   < > 48.0* 50.4* 53.0* 56.1* 52.9*  PLT 282   < > 268 375 402* 377 415*  MCV 91.7   < > 92.1 89.7 91.1 90.6 89.1  MCH 30.3   < > 29.4 29.5 29.4 29.4 29.3  MCHC 33.1   < > 31.9 32.9 32.3 32.4 32.9  RDW 13.0   < > 12.9 12.9 13.0 12.8 12.7  LYMPHSABS 0.6*  --  0.6*  --   --   --   --   MONOABS 0.5  --  0.3  --   --   --   --   EOSABS 0.0  --  0.0  --   --   --   --   BASOSABS 0.0  --  0.0  --   --   --   --    < > = values in this interval not displayed.    Chemistries  Recent Labs  Lab 12/15/19 0555 12/16/19 0334 12/17/19 1143 12/18/19 0410 12/19/19 0434  NA 143 141 140 139 140  K 4.5 4.1 4.2 3.8 3.4*  CL 108 105 102 104 104  CO2 22 25 26  20* 24  GLUCOSE 159* 249* 254* 155* 97  BUN 20 22* 21* 22* 20  CREATININE 0.68 0.67 0.82 0.72 0.81  CALCIUM 8.3* 8.4* 8.5* 8.2* 8.2*  AST 37 38 37 52* 55*  ALT 35 40 44 52* 65*  ALKPHOS 46 50 57 55 56  BILITOT 0.7 0.8 0.7 0.9 1.1   ------------------------------------------------------------------------------------------------------------------ No results for input(s): CHOL, HDL, LDLCALC, TRIG, CHOLHDL, LDLDIRECT in the last 72 hours.  Lab Results  Component Value Date   HGBA1C 10.8 (H) 08-Jan-2020    ------------------------------------------------------------------------------------------------------------------ No results for input(s): TSH, T4TOTAL, T3FREE, THYROIDAB in the last 72 hours.  Invalid input(s): FREET3 ------------------------------------------------------------------------------------------------------------------ Recent Labs    12/17/19 1143  FERRITIN 677*    Coagulation profile No results for input(s): INR, PROTIME in the last 168 hours.  Recent Labs    12/18/19 0654 12/19/19 0434  DDIMER 0.37 0.27    Cardiac Enzymes No results for input(s): CKMB, TROPONINI, MYOGLOBIN in the last 168 hours.  Invalid input(s): CK ------------------------------------------------------------------------------------------------------------------    Component Value Date/Time   BNP 20.9 12/15/2019 0555    Micro Results Recent Results (from the past 240 hour(s))  SARS Coronavirus 2 by RT PCR (hospital order, performed in [facility] hospital lab) Nasopharyngeal Nasopharyngeal Swab     Status: Abnormal   Collection Time: 2020-01-08  3:32 PM   Specimen: Nasopharyngeal Swab  Result Value Ref Range Status   SARS Coronavirus 2 POSITIVE (A) NEGATIVE Final    Comment: RESULT CALLED TO, READ BACK BY AND VERIFIED WITH: TALBOTT,T @ 1846 ON 01/08/2020 BY JUW (NOTE) SARS-CoV-2 target nucleic acids are DETECTED  SARS-CoV-2 RNA is generally detectable in upper respiratory specimens  during the acute phase of infection.  Positive results are indicative  of the presence of the identified virus, but do not rule out bacterial infection or co-infection with other pathogens not detected by the test.  Clinical correlation with patient history and  other diagnostic information is necessary to determine patient infection status.  The expected result is negative.  Fact Sheet for Patients:   02/09/20   Fact Sheet for Healthcare Providers:    BoilerBrush.com.cy    This test is not yet approved or cleared by the https://pope.com/ FDA and  has been authorized for detection and/or diagnosis of SARS-CoV-2 by FDA under an Emergency Use Authorization (EUA).  This EUA will remain in effect (meaning  this tes t can be used) for the duration of  the COVID-19 declaration under Section 564(b)(1) of the Act, 21 U.S.C. section 360-bbb-3(b)(1), unless the authorization is terminated or revoked sooner.  Performed at [facility], [address], [place], [place] [address]   Blood Culture (routine x 2)     Status: Abnormal   Collection Time: 01/05/2020  3:43 PM   Specimen: Right Antecubital; Blood  Result Value Ref Range Status   Specimen Description   Final    RIGHT ANTECUBITAL Performed at [facility], [address], [place], [place] [address]    Special Requests   Final    BOTTLES DRAWN AEROBIC AND ANAEROBIC Blood Culture adequate volume Performed at [facility], [address], [place], [place] [address]    Culture  Setup Time   Final    GRAM POSITIVE COCCI ANAEROBIC BOTTLE ONLY Gram Stain Report Called to,Read Back By and Verified With: BAYNES @ 1220 ON [id number] BY HENDERSON L. CRITICAL RESULT CALLED TO, READ BACK BY AND VERIFIED WITH: RN BRANDY BAYNES 1836 [phone] FCP    Culture (A)  Final    STAPHYLOCOCCUS EPIDERMIDIS THE SIGNIFICANCE OF ISOLATING THIS ORGANISM FROM A SINGLE SET OF BLOOD CULTURES WHEN MULTIPLE SETS ARE DRAWN IS UNCERTAIN. PLEASE NOTIFY THE MICROBIOLOGY DEPARTMENT WITHIN ONE WEEK IF SPECIATION AND SENSITIVITIES ARE REQUIRED. Performed at [facility], [address] [place], [place] [address]    Report Status 12/13/2019 FINAL  Final  Blood Culture ID Panel (Reflexed)     Status: Abnormal   Collection Time: 12/08/2019  3:43 PM  Result Value Ref Range Status   Enterococcus faecalis NOT DETECTED NOT DETECTED Final   Enterococcus Faecium NOT DETECTED NOT DETECTED Final   Listeria  monocytogenes NOT DETECTED NOT DETECTED Final   Staphylococcus species DETECTED (A) NOT DETECTED Final    Comment: CRITICAL RESULT CALLED TO, READ BACK BY AND VERIFIED WITH: RN BRANDY BAYNES 1836 [phone] FCP    Staphylococcus aureus (BCID) NOT DETECTED NOT DETECTED Final   Staphylococcus epidermidis DETECTED (A) NOT DETECTED Final    Comment: CRITICAL RESULT CALLED TO, READ BACK BY AND VERIFIED WITH: RN BRANDY BAYNES 1836 [phone] FCP    Staphylococcus lugdunensis NOT DETECTED NOT DETECTED Final   Streptococcus species NOT DETECTED NOT DETECTED Final   Streptococcus agalactiae NOT DETECTED NOT DETECTED Final   Streptococcus pneumoniae NOT DETECTED NOT DETECTED Final   Streptococcus pyogenes NOT DETECTED NOT DETECTED Final   A.calcoaceticus-baumannii NOT DETECTED NOT DETECTED Final   Bacteroides fragilis NOT DETECTED NOT DETECTED Final   Enterobacterales NOT DETECTED NOT DETECTED Final   Enterobacter cloacae complex NOT DETECTED NOT DETECTED Final   Escherichia coli NOT DETECTED NOT DETECTED Final   Klebsiella aerogenes NOT DETECTED NOT DETECTED Final   Klebsiella oxytoca NOT DETECTED NOT DETECTED Final   Klebsiella pneumoniae NOT DETECTED NOT DETECTED Final   Proteus species NOT DETECTED NOT DETECTED Final   Salmonella species NOT DETECTED NOT DETECTED Final   Serratia marcescens NOT DETECTED NOT DETECTED Final   Haemophilus influenzae NOT DETECTED NOT DETECTED Final   Neisseria meningitidis NOT DETECTED NOT DETECTED Final   Pseudomonas aeruginosa NOT DETECTED NOT DETECTED Final   Stenotrophomonas maltophilia NOT DETECTED NOT DETECTED Final   Candida albicans NOT DETECTED NOT DETECTED Final   Candida auris NOT DETECTED NOT DETECTED Final   Candida glabrata NOT DETECTED NOT DETECTED Final   Candida krusei NOT DETECTED NOT DETECTED Final   Candida parapsilosis NOT DETECTED NOT DETECTED Final  Candida tropicalis NOT DETECTED NOT DETECTED Final   Cryptococcus neoformans/gattii NOT  DETECTED NOT DETECTED Final   Methicillin resistance mecA/C NOT DETECTED NOT DETECTED Final    Comment: Performed at [facility], [address], [place], [place] [address]  Blood Culture (routine x 2)     Status: None   Collection Time: 2020/01/07  3:44 PM   Specimen: Left Antecubital; Blood  Result Value Ref Range Status   Specimen Description LEFT ANTECUBITAL  Final   Special Requests   Final    BOTTLES DRAWN AEROBIC ONLY Blood Culture results may not be optimal due to an inadequate volume of blood received in culture bottles   Culture   Final    NO GROWTH 5 DAYS Performed at [facility], [address], [place], [place] [address]    Report Status 12/15/2019 FINAL  Final    Radiology Reports DG Chest Port 1 View  Result Date: 12/14/2019 CLINICAL DATA:  Shortness of breath, COVID. EXAM: PORTABLE CHEST 1 VIEW COMPARISON:  Prior chest radiographs 2020/01/07 and earlier. FINDINGS: Shallow inspiration radiograph. The cardiomediastinal silhouette is unchanged. Similar to the prior examination of 01/07/20, there are interstitial and ill-defined airspace opacities within the left greater than right mid to lower lung fields. No definite pleural effusion. No evidence of pneumothorax. No acute bony abnormality identified. IMPRESSION: Shallow inspiration radiograph. Unchanged appearance of interstitial and ill-defined airspace opacities within the left greater than right mid-to-lower lung fields. These findings likely reflect multifocal pneumonia given the provided history of COVID positivity. Electronically Signed   By: Jackey Loge DO   On: 12/14/2019 08:20   DG Chest Port 1 View  Result Date: January 07, 2020 CLINICAL DATA:  COVID positive EXAM: PORTABLE CHEST 1 VIEW COMPARISON:  04/19/2019 chest radiograph and prior. FINDINGS: Hypoinflated lungs. Bilateral mid to lower lung patchy opacities, new since prior exam. No pneumothorax. Trace bilateral effusions. Cardiomediastinal silhouette within normal  limits. IMPRESSION: New bilateral mid to lower lung patchy opacities. Hypoinflated lungs. Electronically Signed   By: Stana Bunting M.D.   On: 2020-01-07 13:57   ECHOCARDIOGRAM LIMITED  Result Date: 12/16/2019    ECHOCARDIOGRAM LIMITED REPORT   Patient Name:   Linda Richmond Date of Exam: 12/16/2019 Medical Rec #:  [id number]     Height:       62.0 in Accession #:    [id number]    Weight:       262.0 lb Date of Birth:  [birth year]      BSA:          2.144 m Patient Age:    54 years      BP:           109/73 mmHg Patient Gender: F             HR:           131 bpm. Exam Location:  Inpatient Procedure: Limited Echo, Limited Color Doppler, Cardiac Doppler and Intracardiac            Opacification Agent Indications:    Acute Hypoxic Respiratory Failure  History:        Patient has prior history of Echocardiogram examinations, most                 recent 09/06/2016. Risk Factors:Hypertension and Diabetes. GERD.  Sonographer:    [name] RDCS Referring Phys: [id number] Deno Lunger [facility] IMPRESSIONS  1. Left ventricular ejection fraction, by estimation, is 60 to 65%. The left ventricle has  normal function. The left ventricle has no regional wall motion abnormalities. There is mild left ventricular hypertrophy. Left ventricular diastolic function could not be evaluated.  2. The mitral valve is grossly normal.  3. The aortic valve is tricuspid. Aortic valve regurgitation is not visualized. No aortic stenosis is present. Comparison(s): Prior images unable to be directly viewed, comparison made by report only. Conclusion(s)/Recommendation(s): Otherwise normal echocardiogram, with minor abnormalities described in the report. FINDINGS  Left Ventricle: Left ventricular ejection fraction, by estimation, is 60 to 65%. The left ventricle has normal function. The left ventricle has no regional wall motion abnormalities. Definity contrast agent was given IV to delineate the left ventricular  endocardial borders. There is mild left  ventricular hypertrophy. Left ventricular diastolic function could not be evaluated. Left ventricular diastolic function could not be evaluated due to atrial fibrillation. Left Atrium: Left atrial size was normal in size. Right Atrium: Right atrial size was not well visualized. Pericardium: Trivial pericardial effusion is present. Mitral Valve: The mitral valve is grossly normal. Tricuspid Valve: The tricuspid valve is normal in structure. Tricuspid valve regurgitation is trivial. No evidence of tricuspid stenosis. Aortic Valve: The aortic valve is tricuspid. Aortic valve regurgitation is not visualized. No aortic stenosis is present. Pulmonic Valve: The pulmonic valve was not well visualized. Pulmonic valve regurgitation is not visualized. Aorta: The aortic root is normal in size and structure. Venous: The inferior vena cava was not well visualized. IAS/Shunts: The atrial septum is grossly normal. LEFT VENTRICLE PLAX 2D LVIDd:         4.50 cm LVIDs:         2.80 cm LV PW:         1.10 cm LV IVS:        1.20 cm LVOT diam:     1.80 cm LV SV:         28 LV SV Index:   13 LVOT Area:     2.54 cm  LV Volumes (MOD) LV vol d, MOD A2C: 113.0 ml LV vol d, MOD A4C: 118.0 ml LV vol s, MOD A2C: 53.6 ml LV vol s, MOD A4C: 41.4 ml LV SV MOD A2C:     59.4 ml LV SV MOD A4C:     118.0 ml LV SV MOD BP:      71.7 ml LEFT ATRIUM             Index LA diam:        3.90 cm 1.82 cm/m LA Vol (A2C):   34.2 ml 15.95 ml/m LA Vol (A4C):   23.4 ml 10.91 ml/m LA Biplane Vol: 29.0 ml 13.53 ml/m  AORTIC VALVE LVOT Vmax:   83.60 cm/s LVOT Vmean:  49.400 cm/s LVOT VTI:    0.110 m  AORTA Ao Root diam: 2.90 cm MITRAL VALVE MV Area (PHT): 4.09 cm    SHUNTS MV Decel Time: 186 msec    Systemic VTI:  0.11 m MV E velocity: 72.40 cm/s  Systemic Diam: 1.80 cm Jodelle Red MD Electronically signed by Jodelle Red MD Signature Date/Time: 12/16/2019/9:51:17 PM    Final

## 2019-12-20 LAB — GLUCOSE, CAPILLARY
Glucose-Capillary: 114 mg/dL — ABNORMAL HIGH (ref 70–99)
Glucose-Capillary: 61 mg/dL — ABNORMAL LOW (ref 70–99)
Glucose-Capillary: 74 mg/dL (ref 70–99)
Glucose-Capillary: 96 mg/dL (ref 70–99)
Glucose-Capillary: 243 mg/dL — ABNORMAL HIGH (ref 70–99)
Glucose-Capillary: 265 mg/dL — ABNORMAL HIGH (ref 70–99)

## 2019-12-20 LAB — CBC
HCT: 52.2 % — ABNORMAL HIGH (ref 36.0–46.0)
Hemoglobin: 17.5 g/dL — ABNORMAL HIGH (ref 12.0–15.0)
MCH: 30.2 pg (ref 26.0–34.0)
MCHC: 33.5 g/dL (ref 30.0–36.0)
MCV: 90 fL (ref 80.0–100.0)
Platelets: 410 10*3/uL — ABNORMAL HIGH (ref 150–400)
RBC: 5.8 MIL/uL — ABNORMAL HIGH (ref 3.87–5.11)
RDW: 12.9 % (ref 11.5–15.5)
WBC: 10.5 10*3/uL (ref 4.0–10.5)
nRBC: 0 % (ref 0.0–0.2)

## 2019-12-20 LAB — COMPREHENSIVE METABOLIC PANEL
ALT: 71 U/L — ABNORMAL HIGH (ref 0–44)
AST: 51 U/L — ABNORMAL HIGH (ref 15–41)
Albumin: 2.6 g/dL — ABNORMAL LOW (ref 3.5–5.0)
Alkaline Phosphatase: 59 U/L (ref 38–126)
Anion gap: 12 (ref 5–15)
BUN: 20 mg/dL (ref 6–20)
CO2: 25 mmol/L (ref 22–32)
Calcium: 8.2 mg/dL — ABNORMAL LOW (ref 8.9–10.3)
Chloride: 105 mmol/L (ref 98–111)
Creatinine, Ser: 0.81 mg/dL (ref 0.44–1.00)
GFR calc Af Amer: >60 mL/min (ref >60)
GFR calc non Af Amer: >60 mL/min (ref >60)
Glucose, Bld: 90 mg/dL (ref 70–99)
Potassium: 3.3 mmol/L — ABNORMAL LOW (ref 3.5–5.1)
Sodium: 142 mmol/L (ref 135–145)
Total Bilirubin: 0.9 mg/dL (ref 0.3–1.2)
Total Protein: 6.2 g/dL — ABNORMAL LOW (ref 6.5–8.1)

## 2019-12-20 LAB — C-REACTIVE PROTEIN: CRP: 0.8 mg/dL (ref <1.0)

## 2019-12-20 LAB — D-DIMER, QUANTITATIVE: D-Dimer, Quant: <0.27 ug/mL-FEU (ref 0.00–0.50)

## 2019-12-20 MED ORDER — POTASSIUM CHLORIDE CRYS ER 20 MEQ PO TBCR
40.0000 meq | EXTENDED_RELEASE_TABLET | Freq: Once | ORAL | Status: AC
  Administered 2019-12-20: 40 meq via ORAL
  Filled 2019-12-20: qty 2

## 2019-12-20 MED ORDER — DILTIAZEM HCL 60 MG PO TABS
60.0000 mg | ORAL_TABLET | Freq: Three times a day (TID) | ORAL | Status: DC
  Administered 2019-12-20 – 2019-12-21 (×5): 60 mg via ORAL
  Filled 2019-12-20 (×6): qty 1

## 2019-12-20 MED ORDER — INSULIN ASPART 100 UNIT/ML ~~LOC~~ SOLN
4.0000 [IU] | Freq: Three times a day (TID) | SUBCUTANEOUS | Status: DC
  Administered 2019-12-21 – 2019-12-23 (×8): 4 [IU] via SUBCUTANEOUS

## 2019-12-20 MED ORDER — APIXABAN 5 MG PO TABS
5.0000 mg | ORAL_TABLET | Freq: Two times a day (BID) | ORAL | Status: DC
  Administered 2019-12-20 – 2019-12-28 (×17): 5 mg via ORAL
  Filled 2019-12-20 (×17): qty 1

## 2019-12-20 MED ORDER — POTASSIUM CHLORIDE CRYS ER 20 MEQ PO TBCR
20.0000 meq | EXTENDED_RELEASE_TABLET | Freq: Once | ORAL | Status: AC
  Administered 2019-12-20: 20 meq via ORAL
  Filled 2019-12-20: qty 1

## 2019-12-20 MED ORDER — INSULIN DETEMIR 100 UNIT/ML ~~LOC~~ SOLN
15.0000 [IU] | Freq: Two times a day (BID) | SUBCUTANEOUS | Status: DC
  Administered 2019-12-20 – 2019-12-25 (×10): 15 [IU] via SUBCUTANEOUS
  Filled 2019-12-20 (×12): qty 0.15

## 2019-12-20 NOTE — Progress Notes (Signed)
Physical Therapy Treatment Patient Details Name: Linda Richmond MRN: [id number] DOB: [birth year] Today's Date: 12/20/2019    History of Present Illness Pt adm to [facility] December 26, 2019 with acute hypoxic respiratory failure due to covid PNA. Transferred to [facility] on 12/13/19. PMH - DM, RA, morbid obesity.    PT Comments    Pt received in bed. Very anxious this morning. Pt stating she feels like she is having a panic attack. HR in 70s. Pt on 15L NRB. SpO2 initially in 70s but quickly improved to 86% with relaxation cues/techniques. Pt agreeable to transfer OOB to recliner. She required min assist supine to sit, min assist sit to stand, and min guard assist SPT.  SpO2 77% after transfer with quick recovery to 86%. Pt instructed in ankle pumps and heel slide exercises to be performed while sitting up in recliner.   Follow Up Recommendations  No PT follow up     Equipment Recommendations  None recommended by PT    Recommendations for Other Services       Precautions / Restrictions Precautions Precautions: Other (comment) Precaution Comments: watch SpO2, severe anxiety    Mobility  Bed Mobility Overal bed mobility: Needs Assistance Bed Mobility: Supine to Sit     Supine to sit: Min assist;HOB elevated     General bed mobility comments: assist to elevate trunk, increased time  Transfers Overall transfer level: Needs assistance Equipment used: None Transfers: Sit to/from [employer] Sit to Stand: Min assist Stand pivot transfers: Min guard       General transfer comment: assist to power up, min guard to pivot  Ambulation/Gait             General Gait Details: unable due to anxiety/desat   Stairs             Wheelchair Mobility    Modified Rankin (Stroke Patients Only)       Balance Overall balance assessment: No apparent balance deficits (not formally assessed)                                          Cognition Arousal/Alertness:  Awake/alert Behavior During Therapy: WFL for tasks assessed/performed;Anxious Overall Cognitive Status: Within Functional Limits for tasks assessed                                        Exercises      General Comments General comments (skin integrity, edema, etc.): Pt on 15L NRB. Unable to tolerate HFNC due to nose bleed. SpO2 86-91% at rest. Desat to 77% during mobility. HR in 70s.      Pertinent Vitals/Pain Pain Assessment: No/denies pain    Home Living                      Prior Function            PT Goals (current goals can now be found in the care plan section) Acute Rehab PT Goals Patient Stated Goal: return home Progress towards PT goals: Progressing toward goals    Frequency    Min 3X/week      PT Plan Current plan remains appropriate    Co-evaluation PT/OT/SLP Co-Evaluation/Treatment: Yes Reason for Co-Treatment: To address functional/ADL transfers;Necessary to address cognition/behavior during functional activity;Other (comment) (anxiety/activity tolerance) PT  goals addressed during session: Mobility/safety with mobility;Balance        AM-PAC PT "6 Clicks" Mobility   Outcome Measure  Help needed turning from your back to your side while in a flat bed without using bedrails?: None Help needed moving from lying on your back to sitting on the side of a flat bed without using bedrails?: A Little Help needed moving to and from a bed to a chair (including a wheelchair)?: A Little Help needed standing up from a chair using your arms (e.g., wheelchair or bedside chair)?: A Little Help needed to walk in hospital room?: A Little Help needed climbing 3-5 steps with a railing? : A Little 6 Click Score: 19    End of Session Equipment Utilized During Treatment: Oxygen Activity Tolerance: Treatment limited secondary to medical complications (Comment) (desat, anxiety) Patient left: in chair;with call bell/phone within reach Nurse  Communication: Mobility status PT Visit Diagnosis: Other abnormalities of gait and mobility (R26.89)     Time: 9628-3662 PT Time Calculation (min) (ACUTE ONLY): 23 min  Charges:  $Therapeutic Activity: 8-22 mins                     Aida Raider, PT  Office # [phone] Pager [phone]    Ilda Foil 12/20/2019, 12:21 PM

## 2019-12-20 NOTE — Progress Notes (Signed)
PROGRESS NOTE                                                                                                                                                                                                             Patient Demographics:    Linda Richmond, is a 54 y.o. female, DOB - 01-04-1966, NWG:[id number]  Outpatient Primary MD for the patient is Ignatius Specking, MD   Admit date - 12/25/2019   LOS - 10  Chief Complaint  Patient presents with  . Shortness of Breath       Brief Narrative: Patient is a 54 y.o. female with PMHx of anxiety, depression, DM-2, HTN, seronegative RA-who tested positive for COVID-19 on 8/27-admitted to [facility] on 9/3 with acute hypoxemic respiratory failure secondary to COVID-19 pneumonia.  Unfortunately hospital course complicated by worsening hypoxemia requiring HFNC.  Transferred to [facility] for further evaluation and treatment.  See below for further details  COVID-19 vaccinated status: Unvaccinated  Significant Events: 8/27>> COVID-19 positive 9/3>> Admit to [facility] for hypoxemia secondary to COVID-19 9/6>> transferred to [facility]-worsening hypoxemia on 15 L of HFNC 9/8>> A. fib with RVR  Significant studies: 9/3>>Chest x-ray: New bilateral mid to lower lung patchy opacities 9/9>> Echo: EF 60-65%  COVID-19 medications: Steroids: 9/3>> Remdesivir: 9/3>> 9/7 Baricitinib: 9/5  Antibiotics: None  Microbiology data: 9/20 >>blood culture: 1/2-Staph epidermidis (likely contaminant)  Procedures: None  Consults: Cardiology  DVT prophylaxis: SCDs Start: 12/20/2019 1527 Lovenox at therapeutic dosing    Subjective:   Reports no epistaxis, she is tolerating NRB, could not tolerate HFNC yesterday.     Assessment  & Plan :   Acute Hypoxic Resp Failure due to Covid 19 Viral pneumonia:  - Continues to have severe hypoxemia-stable on NRB-not on HFNC since 9/10 due to mild  epistaxis.   -Remains on steroids and baricitinib.   - No signs of volume overload.   -So far she has not been able to tolerate high flow nasal cannula, she is tolerating NRB, will continue with that right now, I have encouraged her again to use incentive spirometry, flutter valve, to get out of bed to chair.   Fever: afebrile O2 requirements:  SpO2: 90 % O2 Flow Rate (L/min): 15 L/min FiO2 (%): 94 %   COVID-19 Labs: Recent Labs    12/18/19 0410  12/18/19 0654 12/19/19 0434 12/20/19 0414  DDIMER  --  0.37 0.27 <0.27  CRP 0.7  --  0.6 0.8       Component Value Date/Time   BNP 20.9 12/15/2019 0555    No results for input(s): PROCALCITON in the last 168 hours.  Lab Results  Component Value Date   SARSCOV2NAA POSITIVE (A) January 08, 2020   SARSCOV2NAA Not Detected 04/13/2019   SARSCOV2NAA Not Detected 02/02/2019   SARSCOV2NAA Not Detected 01/22/2019      Prone/Incentive Spirometry: encouraged patient to lie prone for 3-4 hours at a time for a total of 16 hours a day, and to encourage incentive spirometry use 3-4/hour.  Epistaxis:  -This appears to be improving, she cannot tolerate HFNC, continue with NRB which has been helping with epistaxis as well  -So far has been stable, no recurrence for last couple days.  A. fib with RVR:  -Management per cardiology, she converted to normal sinus rhythm, currently off Cardizem drip, continue with digoxin and beta-blockers per cardiology.   -Transitioned from Lovenox to Eliquis .  DM-2 (A1c 10.8) with steroid induced hypoglycemia:  -CBG on the lower side this morning despite decreasing her NovoLog and Levemir yesterday, will decrease Levemir further to 15 units twice daily, and Levemir to 40 units before meals, continue with sliding scale.  Recent Labs    12/20/19 0727 12/20/19 0924 12/20/19 1133  GLUCAP 61* 74 96   HTN: BP controlled on metoprolol.  No longer on Avapro.  Hypothyroidism: Continue thyroid Armour-TSH within normal  limits  GERD: Continue PPI  Positive blood culture-1/2+ for staph epidermidis-likely represents a contaminant-no treatment required.  Nutrition Problem: Nutrition Problem: Increased nutrient needs Etiology: acute illness (COVID-19) Signs/Symptoms: estimated needs Interventions: Glucerna shake, MVI, Premier Protein  Morbid Obesity: Estimated body mass index is 45.77 kg/m as calculated from the following:   Height as of this encounter: 5\' 2"  (1.575 m).   Weight as of this encounter: 113.5 kg.    ABG: No results found for: PHART, PCO2ART, PO2ART, HCO3, TCO2, ACIDBASEDEF, O2SAT  Vent Settings:     Condition - Extremely Guarded  Family Communication  :  Daughter Linda Richmond [phone]) phone on . 9/12, 9/13.  Code Status :  Full Code  Diet :  Diet Order            Diet Carb Modified Fluid consistency: Thin; Room service appropriate? Yes  Diet effective now                  Disposition Plan  :   Status is: Inpatient  Remains inpatient appropriate because:Inpatient level of care appropriate due to severity of illness  Dispo: The patient is from: Home              Anticipated d/c is to: Home              Anticipated d/c date is: > 3 days              Patient currently is not medically stable to d/c.   Barriers to discharge: Hypoxia requiring O2 supplementation  Antimicorbials  :    Anti-infectives (From admission, onward)   Start     Dose/Rate Route Frequency Ordered Stop   12/11/19 1000  remdesivir 100 mg in sodium chloride 0.9 % 100 mL IVPB  Status:  Discontinued       "Followed by" Linked Group Details   100 mg 200 mL/hr over 30 Minutes Intravenous Daily 01/08/2020 1529 08-Jan-2020 1532  12/11/19 1000  remdesivir 100 mg in sodium chloride 0.9 % 100 mL IVPB        100 mg 200 mL/hr over 30 Minutes Intravenous Daily 12/19/2019 1533 12/14/19 0907   12/09/2019 1545  remdesivir 100 mg in sodium chloride 0.9 % 100 mL IVPB        100 mg 200 mL/hr over 30 Minutes  Intravenous Every 30 min 12/19/2019 1533 12/19/2019 1850   12/24/2019 1530  remdesivir 200 mg in sodium chloride 0.9% 250 mL IVPB  Status:  Discontinued       "Followed by" Linked Group Details   200 mg 580 mL/hr over 30 Minutes Intravenous Once 12/25/2019 1529 12/20/2019 1532      Inpatient Medications  Scheduled Meds: . apixaban  5 mg Oral BID  . baricitinib  4 mg Oral Daily  . digoxin  0.125 mg Oral Daily  . diltiazem  60 mg Oral Q8H  . insulin aspart  0-20 Units Subcutaneous TID WC  . insulin aspart  0-5 Units Subcutaneous QHS  . insulin aspart  8 Units Subcutaneous TID WC  . insulin detemir  28 Units Subcutaneous BID  . Ipratropium-Albuterol  1 puff Inhalation Q6H  . linagliptin  5 mg Oral Daily  . methylPREDNISolone (SOLU-MEDROL) injection  40 mg Intravenous Q12H  . metoprolol tartrate  25 mg Oral QID  . Ensure Max Protein  11 oz Oral BID  . thyroid  30 mg Oral QAC breakfast  . Vitamin D (Ergocalciferol)  50,000 Units Oral Q7 days   Continuous Infusions: . sodium chloride 250 mL (12/12/19 0851)   PRN Meds:.sodium chloride, acetaminophen, ALPRAZolam, chlorpheniramine-HYDROcodone, guaiFENesin-dextromethorphan, metoprolol tartrate, pantoprazole, sodium chloride   Time Spent in minutes 35   See all Orders from today for further details   Huey Bienenstock M.D on 12/20/2019 at 3:19 PM  To page go to [email or web address] - use universal password  [facility] -  Office  [phone]    Objective:   Vitals:   12/20/19 0728 12/20/19 0800 12/20/19 1135 12/20/19 1200  BP: 114/68  116/64   Pulse: 72  65   Resp: (!) 21  Temp: 97.8 F (36.6 C)  98.4 F (36.9 C)   TempSrc: Axillary  Axillary   SpO2: 90% 90% 90% 90%  Weight:      Height:        Wt Readings from Last 3 Encounters:  12/20/19 113.5 kg  10/25/16 121.1 kg  08/23/16 126.6 kg     Intake/Output Summary (Last 24 hours) at 12/20/2019 1519 Last data filed at 12/20/2019 1145 Gross per 24 hour  Intake 430 ml   Output 1150 ml  Net -720 ml     Physical Exam  Awake Alert, Oriented X 3, No new F.N deficits, Normal affect Symmetrical Chest wall movement, Good air movement bilaterally, CTAB RRR,No Gallops,Rubs or new Murmurs, No Parasternal Heave +ve B.Sounds, Abd Soft, No tenderness, No rebound - guarding or rigidity. No Cyanosis, Clubbing or edema, No new Rash or bruise   [name]   Data Review:    CBC Recent Labs  Lab 12/14/19 0522 12/14/19 0522 12/15/19 0555 12/15/19 0555 12/16/19 0334 12/17/19 1143 12/18/19 0410 12/19/19 0434 12/20/19 0414  WBC 6.2   < > 4.6   < > 11.4* 10.5 9.6 11.7* 10.5  HGB 15.6*   < > 15.3*   < > 16.6* 17.1* 18.2* 17.4* 17.5*  HCT 47.2*   < > 48.0*   < > 50.4* 53.0* 56.1*  52.9* 52.2*  PLT 282   < > 268   < > 375 402* 377 415* 410*  MCV 91.7   < > 92.1   < > 89.7 91.1 90.6 89.1 90.0  MCH 30.3   < > 29.4   < > 29.5 29.4 29.4 29.3 30.2  MCHC 33.1   < > 31.9   < > 32.9 32.3 32.4 32.9 33.5  RDW 13.0   < > 12.9   < > 12.9 13.0 12.8 12.7 12.9  LYMPHSABS 0.6*  --  0.6*  --   --   --   --   --   --   MONOABS 0.5  --  0.3  --   --   --   --   --   --   EOSABS 0.0  --  0.0  --   --   --   --   --   --   BASOSABS 0.0  --  0.0  --   --   --   --   --   --    < > = values in this interval not displayed.    Chemistries  Recent Labs  Lab 12/16/19 0334 12/17/19 1143 12/18/19 0410 12/19/19 0434 12/20/19 0414  NA 141 140 139 140 142  K 4.1 4.2 3.8 3.4* 3.3*  CL 105 102 104 104 105  CO2 25 26 20* 24 25  GLUCOSE 249* 254* 155* 97 90  BUN 22* 21* 22* 20 20  CREATININE 0.67 0.82 0.72 0.81 0.81  CALCIUM 8.4* 8.5* 8.2* 8.2* 8.2*  AST 38 37 52* 55* 51*  ALT 40 44 52* 65* 71*  ALKPHOS 50 57 55 56 59  BILITOT 0.8 0.7 0.9 1.1 0.9   ------------------------------------------------------------------------------------------------------------------ No results for input(s): CHOL, HDL, LDLCALC, TRIG, CHOLHDL, LDLDIRECT in the last 72 hours.  Lab Results  Component  Value Date   HGBA1C 10.8 (H) 01/04/2020   ------------------------------------------------------------------------------------------------------------------ No results for input(s): TSH, T4TOTAL, T3FREE, THYROIDAB in the last 72 hours.  Invalid input(s): FREET3 ------------------------------------------------------------------------------------------------------------------ No results for input(s): VITAMINB12, FOLATE, FERRITIN, TIBC, IRON, RETICCTPCT in the last 72 hours.  Coagulation profile No results for input(s): INR, PROTIME in the last 168 hours.  Recent Labs    12/19/19 0434 12/20/19 0414  DDIMER 0.27 <0.27    Cardiac Enzymes No results for input(s): CKMB, TROPONINI, MYOGLOBIN in the last 168 hours.  Invalid input(s): CK ------------------------------------------------------------------------------------------------------------------    Component Value Date/Time   BNP 20.9 12/15/2019 0555    Micro Results Recent Results (from the past 240 hour(s))  SARS Coronavirus 2 by RT PCR (hospital order, performed in [facility] hospital lab) Nasopharyngeal Nasopharyngeal Swab     Status: Abnormal   Collection Time: 12/15/2019  3:32 PM   Specimen: Nasopharyngeal Swab  Result Value Ref Range Status   SARS Coronavirus 2 POSITIVE (A) NEGATIVE Final    Comment: RESULT CALLED TO, READ BACK BY AND VERIFIED WITH: TALBOTT,T @ 1846 ON 12/17/2019 BY JUW (NOTE) SARS-CoV-2 target nucleic acids are DETECTED  SARS-CoV-2 RNA is generally detectable in upper respiratory specimens  during the acute phase of infection.  Positive results are indicative  of the presence of the identified virus, but do not rule out bacterial infection or co-infection with other pathogens not detected by the test.  Clinical correlation with patient history and  other diagnostic information is necessary to determine patient infection status.  The expected result is negative.  Fact Sheet for Patients:  BoilerBrush.com.cy   Fact Sheet for Healthcare Providers:   https://pope.com/    This test is not yet approved or cleared by the [place] FDA and  has been authorized for detection and/or diagnosis of SARS-CoV-2 by FDA under an Emergency Use Authorization (EUA).  This EUA will remain in effect (meaning this tes t can be used) for the duration of  the COVID-19 declaration under Section 564(b)(1) of the Act, 21 U.S.C. section 360-bbb-3(b)(1), unless the authorization is terminated or revoked sooner.  Performed at [facility], [address], [place], [place] [address]   Blood Culture (routine x 2)     Status: Abnormal   Collection Time: 01-02-2020  3:43 PM   Specimen: Right Antecubital; Blood  Result Value Ref Range Status   Specimen Description   Final    RIGHT ANTECUBITAL Performed at [facility], [address] [place], [place] [address]    Special Requests   Final    BOTTLES DRAWN AEROBIC AND ANAEROBIC Blood Culture adequate volume Performed at [facility], [address] [place], [place] [address]    Culture  Setup Time   Final    GRAM POSITIVE COCCI ANAEROBIC BOTTLE ONLY Gram Stain Report Called to,Read Back By and Verified With: BAYNES @ 1220 ON [id number] BY HENDERSON L. CRITICAL RESULT CALLED TO, READ BACK BY AND VERIFIED WITH: RN BRANDY BAYNES 1836 [phone] FCP    Culture (A)  Final    STAPHYLOCOCCUS EPIDERMIDIS THE SIGNIFICANCE OF ISOLATING THIS ORGANISM FROM A SINGLE SET OF BLOOD CULTURES WHEN MULTIPLE SETS ARE DRAWN IS UNCERTAIN. PLEASE NOTIFY THE MICROBIOLOGY DEPARTMENT WITHIN ONE WEEK IF SPECIATION AND SENSITIVITIES ARE REQUIRED. Performed at [facility], [address], [place], [place] [address]    Report Status 12/13/2019 FINAL  Final  Blood Culture ID Panel (Reflexed)     Status: Abnormal   Collection Time: Jan 02, 2020  3:43 PM  Result Value Ref Range Status   Enterococcus faecalis NOT DETECTED NOT DETECTED  Final   Enterococcus Faecium NOT DETECTED NOT DETECTED Final   Listeria monocytogenes NOT DETECTED NOT DETECTED Final   Staphylococcus species DETECTED (A) NOT DETECTED Final    Comment: CRITICAL RESULT CALLED TO, READ BACK BY AND VERIFIED WITH: RN BRANDY BAYNES 1836 [phone] FCP    Staphylococcus aureus (BCID) NOT DETECTED NOT DETECTED Final   Staphylococcus epidermidis DETECTED (A) NOT DETECTED Final    Comment: CRITICAL RESULT CALLED TO, READ BACK BY AND VERIFIED WITH: RN BRANDY BAYNES 1836 [phone] FCP    Staphylococcus lugdunensis NOT DETECTED NOT DETECTED Final   Streptococcus species NOT DETECTED NOT DETECTED Final   Streptococcus agalactiae NOT DETECTED NOT DETECTED Final   Streptococcus pneumoniae NOT DETECTED NOT DETECTED Final   Streptococcus pyogenes NOT DETECTED NOT DETECTED Final   A.calcoaceticus-baumannii NOT DETECTED NOT DETECTED Final   Bacteroides fragilis NOT DETECTED NOT DETECTED Final   Enterobacterales NOT DETECTED NOT DETECTED Final   Enterobacter cloacae complex NOT DETECTED NOT DETECTED Final   Escherichia coli NOT DETECTED NOT DETECTED Final   Klebsiella aerogenes NOT DETECTED NOT DETECTED Final   Klebsiella oxytoca NOT DETECTED NOT DETECTED Final   Klebsiella pneumoniae NOT DETECTED NOT DETECTED Final   Proteus species NOT DETECTED NOT DETECTED Final   Salmonella species NOT DETECTED NOT DETECTED Final   Serratia marcescens NOT DETECTED NOT DETECTED Final   Haemophilus influenzae NOT DETECTED NOT DETECTED Final   Neisseria meningitidis NOT DETECTED NOT DETECTED Final   Pseudomonas aeruginosa NOT DETECTED NOT DETECTED Final  Stenotrophomonas maltophilia NOT DETECTED NOT DETECTED Final   Candida albicans NOT DETECTED NOT DETECTED Final   Candida auris NOT DETECTED NOT DETECTED Final   Candida glabrata NOT DETECTED NOT DETECTED Final   Candida krusei NOT DETECTED NOT DETECTED Final   Candida parapsilosis NOT DETECTED NOT DETECTED Final   Candida tropicalis  NOT DETECTED NOT DETECTED Final   Cryptococcus neoformans/gattii NOT DETECTED NOT DETECTED Final   Methicillin resistance mecA/C NOT DETECTED NOT DETECTED Final    Comment: Performed at [facility], [address] [place], [place] [address]  Blood Culture (routine x 2)     Status: None   Collection Time: 05-Jan-2020  3:44 PM   Specimen: Left Antecubital; Blood  Result Value Ref Range Status   Specimen Description LEFT ANTECUBITAL  Final   Special Requests   Final    BOTTLES DRAWN AEROBIC ONLY Blood Culture results may not be optimal due to an inadequate volume of blood received in culture bottles   Culture   Final    NO GROWTH 5 DAYS Performed at [facility], [address] [place], [place] [address]    Report Status 12/15/2019 FINAL  Final    Radiology Reports DG Chest Port 1 View  Result Date: 12/14/2019 CLINICAL DATA:  Shortness of breath, COVID. EXAM: PORTABLE CHEST 1 VIEW COMPARISON:  Prior chest radiographs Jan 05, 2020 and earlier. FINDINGS: Shallow inspiration radiograph. The cardiomediastinal silhouette is unchanged. Similar to the prior examination of January 05, 2020, there are interstitial and ill-defined airspace opacities within the left greater than right mid to lower lung fields. No definite pleural effusion. No evidence of pneumothorax. No acute bony abnormality identified. IMPRESSION: Shallow inspiration radiograph. Unchanged appearance of interstitial and ill-defined airspace opacities within the left greater than right mid-to-lower lung fields. These findings likely reflect multifocal pneumonia given the provided history of COVID positivity. Electronically Signed   By: Jackey Loge DO   On: 12/14/2019 08:20   DG Chest Port 1 View  Result Date: 01/05/20 CLINICAL DATA:  COVID positive EXAM: PORTABLE CHEST 1 VIEW COMPARISON:  04/19/2019 chest radiograph and prior. FINDINGS: Hypoinflated lungs. Bilateral mid to lower lung patchy opacities, new since prior exam. No pneumothorax.  Trace bilateral effusions. Cardiomediastinal silhouette within normal limits. IMPRESSION: New bilateral mid to lower lung patchy opacities. Hypoinflated lungs. Electronically Signed   By: Stana Bunting M.D.   On: 05-Jan-2020 13:57   ECHOCARDIOGRAM LIMITED  Result Date: 12/16/2019    ECHOCARDIOGRAM LIMITED REPORT   Patient Name:   KYIARA TRANK Date of Exam: 12/16/2019 Medical Rec #:  [id number]     Height:       62.0 in Accession #:    [id number]    Weight:       262.0 lb Date of Birth:  March 31, 1966      BSA:          2.144 m Patient Age:    54 years      BP:           109/73 mmHg Patient Gender: F             HR:           131 bpm. Exam Location:  Inpatient Procedure: Limited Echo, Limited Color Doppler, Cardiac Doppler and Intracardiac            Opacification Agent Indications:    Acute Hypoxic Respiratory Failure  History:        Patient has prior history of Echocardiogram examinations, most  recent 09/06/2016. Risk Factors:Hypertension and Diabetes. GERD.  Sonographer:    [name] RDCS Referring Phys: [id number] Deno Lunger [facility] IMPRESSIONS  1. Left ventricular ejection fraction, by estimation, is 60 to 65%. The left ventricle has normal function. The left ventricle has no regional wall motion abnormalities. There is mild left ventricular hypertrophy. Left ventricular diastolic function could not be evaluated.  2. The mitral valve is grossly normal.  3. The aortic valve is tricuspid. Aortic valve regurgitation is not visualized. No aortic stenosis is present. Comparison(s): Prior images unable to be directly viewed, comparison made by report only. Conclusion(s)/Recommendation(s): Otherwise normal echocardiogram, with minor abnormalities described in the report. FINDINGS  Left Ventricle: Left ventricular ejection fraction, by estimation, is 60 to 65%. The left ventricle has normal function. The left ventricle has no regional wall motion abnormalities. Definity contrast agent was given IV to  delineate the left ventricular  endocardial borders. There is mild left ventricular hypertrophy. Left ventricular diastolic function could not be evaluated. Left ventricular diastolic function could not be evaluated due to atrial fibrillation. Left Atrium: Left atrial size was normal in size. Right Atrium: Right atrial size was not well visualized. Pericardium: Trivial pericardial effusion is present. Mitral Valve: The mitral valve is grossly normal. Tricuspid Valve: The tricuspid valve is normal in structure. Tricuspid valve regurgitation is trivial. No evidence of tricuspid stenosis. Aortic Valve: The aortic valve is tricuspid. Aortic valve regurgitation is not visualized. No aortic stenosis is present. Pulmonic Valve: The pulmonic valve was not well visualized. Pulmonic valve regurgitation is not visualized. Aorta: The aortic root is normal in size and structure. Venous: The inferior vena cava was not well visualized. IAS/Shunts: The atrial septum is grossly normal. LEFT VENTRICLE PLAX 2D LVIDd:         4.50 cm LVIDs:         2.80 cm LV PW:         1.10 cm LV IVS:        1.20 cm LVOT diam:     1.80 cm LV SV:         28 LV SV Index:   13 LVOT Area:     2.54 cm  LV Volumes (MOD) LV vol d, MOD A2C: 113.0 ml LV vol d, MOD A4C: 118.0 ml LV vol s, MOD A2C: 53.6 ml LV vol s, MOD A4C: 41.4 ml LV SV MOD A2C:     59.4 ml LV SV MOD A4C:     118.0 ml LV SV MOD BP:      71.7 ml LEFT ATRIUM             Index LA diam:        3.90 cm 1.82 cm/m LA Vol (A2C):   34.2 ml 15.95 ml/m LA Vol (A4C):   23.4 ml 10.91 ml/m LA Biplane Vol: 29.0 ml 13.53 ml/m  AORTIC VALVE LVOT Vmax:   83.60 cm/s LVOT Vmean:  49.400 cm/s LVOT VTI:    0.110 m  AORTA Ao Root diam: 2.90 cm MITRAL VALVE MV Area (PHT): 4.09 cm    SHUNTS MV Decel Time: 186 msec    Systemic VTI:  0.11 m MV E velocity: 72.40 cm/s  Systemic Diam: 1.80 cm Jodelle Red MD Electronically signed by Jodelle Red MD Signature Date/Time: 12/16/2019/9:51:17 PM    Final

## 2019-12-20 NOTE — Progress Notes (Signed)
Primary Cardiologist:  Branch   Subjective:  Converted to NSR Breathing easier   Objective:  Vitals:   12/20/19 0012 12/20/19 0415 12/20/19 0433 12/20/19 0728  BP: 106/77 135/70  114/68  Pulse: 90 70  72  Resp: 20 20  20   Temp: 97.8 F (36.6 C) 98.1 F (36.7 C)  97.8 F (36.6 C)  TempSrc: Axillary Axillary  Axillary  SpO2: 95% (!) 87%  90%  Weight:   113.5 kg   Height:        Intake/Output from previous day:  Intake/Output Summary (Last 24 hours) at 12/20/2019 0902 Last data filed at 12/20/2019 0650 Gross per 24 hour  Intake 300 ml  Output 1150 ml  Net -850 ml    Physical Exam: Affect appropriate Overweight white female sitting in chair  HEENT: normal Neck supple with no adenopathy JVP normal no bruits no thyromegaly Lungs clear with no wheezing and good diaphragmatic motion Heart:  S1/S2 no murmur, no rub, gallop or click PMI normal Abdomen: benighn, BS positve, no tenderness, no AAA no bruit.  No HSM or HJR Distal pulses intact with no bruits No edema Neuro non-focal Skin warm and dry No muscular weakness   Lab Results: Basic Metabolic Panel: Recent Labs    12/19/19 0434 12/20/19 0414  NA 140 142  K 3.4* 3.3*  CL 104 105  CO2 24 25  GLUCOSE 97 90  BUN 20 20  CREATININE 0.81 0.81  CALCIUM 8.2* 8.2*   Liver Function Tests: Recent Labs    12/19/19 0434 12/20/19 0414  AST 55* 51*  ALT 65* 71*  ALKPHOS 56 59  BILITOT 1.1 0.9  PROT 6.4* 6.2*  ALBUMIN 2.7* 2.6*   No results for input(s): LIPASE, AMYLASE in the last 72 hours. CBC: Recent Labs    12/19/19 0434 12/20/19 0414  WBC 11.7* 10.5  HGB 17.4* 17.5*  HCT 52.9* 52.2*  MCV 89.1 90.0  PLT 415* 410*   Cardiac Enzymes: No results for input(s): CKTOTAL, CKMB, CKMBINDEX, TROPONINI in the last 72 hours. BNP: Invalid input(s): POCBNP D-Dimer: Recent Labs    12/19/19 0434 12/20/19 0414  DDIMER 0.27 <0.27    Anemia Panel: Recent Labs    12/17/19 1143  FERRITIN 677*     Imaging: No results found.  Cardiac Studies:  ECG: afib   Telemetry:  NSR rates 80's   Echo: EF 60-65%   Medications:    baricitinib  4 mg Oral Daily   digoxin  0.125 mg Oral Daily   enoxaparin (LOVENOX) injection  120 mg Subcutaneous Q12H   insulin aspart  0-20 Units Subcutaneous TID WC   insulin aspart  0-5 Units Subcutaneous QHS   insulin aspart  8 Units Subcutaneous TID WC   insulin detemir  28 Units Subcutaneous BID   Ipratropium-Albuterol  1 puff Inhalation Q6H   linagliptin  5 mg Oral Daily   methylPREDNISolone (SOLU-MEDROL) injection  40 mg Intravenous Q12H   metoprolol tartrate  25 mg Oral QID   potassium chloride  40 mEq Oral Once   Ensure Max Protein  11 oz Oral BID   thyroid  30 mg Oral QAC breakfast   Vitamin D (Ergocalciferol)  50,000 Units Oral Q7 days      sodium chloride 250 mL (12/12/19 0851)   diltiazem (CARDIZEM) infusion 5 mg/hr (12/19/19 1759)    Assessment/Plan:   1. Afib:  Converted to NSR continue dig and lopressor Change lovenox to Eliquis  2. COVID:  Improving continue steroids and  baricitrib f/u CXR per primary team  Charlton Haws 12/20/2019, 9:02 AM

## 2019-12-20 NOTE — Progress Notes (Addendum)
Occupational Therapy Treatment Patient Details Name: Linda Richmond MRN: [id number] DOB: May 15, 1965 Today's Date: 12/20/2019    History of present illness Pt adm to [facility] 01/06/2020 with acute hypoxic respiratory failure due to covid PNA. Transferred to [facility] on 12/13/19. PMH - DM, RA, morbid obesity.   OT comments  Pt continues to present with increased anxiety. Upon arrival, pt supine in bed with HOB elevated and SpO2 mid-80s on 15L via NRB. With cues for calm purse lip breathing, pt able to elevate SpO2 to 91%. Pt performing bed mobility with Min A for elevating trunk. Pt requiring Mod A for UB bathing while sitting at EOB. Min Guard A to stand pivot to recliner. SpO2 dropping to 77% after transfer and pt requiring seated rest break for recovery. Continue to recommend dc to home once medically stable and will continue to follow acutely as admitted.   At rest, SpO2 85-91% on 15L via NRB. SpO2 dropping to 77% during activity. HR 70s.    Follow Up Recommendations  No OT follow up;Supervision - Intermittent    Equipment Recommendations  Tub/shower seat    Recommendations for Other Services      Precautions / Restrictions Precautions Precautions: Other (comment) Precaution Comments: watch SpO2, severe anxiety       Mobility Bed Mobility Overal bed mobility: Needs Assistance Bed Mobility: Supine to Sit     Supine to sit: Min assist;HOB elevated     General bed mobility comments: assist to elevate trunk, increased time  Transfers Overall transfer level: Needs assistance Equipment used: None Transfers: Sit to/from [employer] Sit to Stand: Min assist Stand pivot transfers: Min guard       General transfer comment: assist to power up, min guard to pivot    Balance Overall balance assessment: No apparent balance deficits (not formally assessed)                                         ADL either performed or assessed with clinical judgement   ADL  Overall ADL's : Needs assistance/impaired         Upper Body Bathing: Moderate assistance Upper Body Bathing Details (indicate cue type and reason): Mod A for washing back while sitting at EOB             Toilet Transfer: Min guard;Stand-pivot (simulated to recliner) Toilet Transfer Details (indicate cue type and reason): Min Guard A for safety. Cues for safety and calm purse lip breathing.         Functional mobility during ADLs: Min guard General ADL Comments: Pt continues to present with anxiety impacting her breathing and occupational performance. Requiring cues and calm environment for decreasing anxiety     Vision   Vision Assessment?: No apparent visual deficits   Perception     Praxis      Cognition Arousal/Alertness: Awake/alert Behavior During Therapy: WFL for tasks assessed/performed;Anxious Overall Cognitive Status: Within Functional Limits for tasks assessed                                          Exercises     Shoulder Instructions       General Comments Pt on 15L NRB. Unable to tolerate HFNC due to nose bleed. SpO2 86-91% at rest. Desat to 77% during  mobility. HR in 70s.    Pertinent Vitals/ Pain       Pain Assessment: No/denies pain  Home Living                                          Prior Functioning/Environment              Frequency  Min 2X/week        Progress Toward Goals  OT Goals(current goals can now be found in the care plan section)  Progress towards OT goals: Progressing toward goals  Acute Rehab OT Goals Patient Stated Goal: return home OT Goal Formulation: With patient Time For Goal Achievement: 12/28/19 Potential to Achieve Goals: Good ADL Goals Pt Will Perform Grooming: with modified independence;standing Pt Will Transfer to Toilet: with modified independence;ambulating;regular height toilet Additional ADL Goal #1: Pt to demonstrate implementation of at least 3 energy  conservation strategies during ADLs Additional ADL Goal #2: Pt to demonstrate ability to independently implement pursed lip breathing in order to maintain O2 >88%  Plan Discharge plan remains appropriate    Co-evaluation    PT/OT/SLP Co-Evaluation/Treatment: Yes Reason for Co-Treatment: To address functional/ADL transfers;Other (comment) (anxiety/ activity tolerance) PT goals addressed during session: Mobility/safety with mobility;Balance OT goals addressed during session: ADL's and self-care      AM-PAC OT "6 Clicks" Daily Activity     Outcome Measure   Help from another person eating meals?: A Little Help from another person taking care of personal grooming?: A Little Help from another person toileting, which includes using toliet, bedpan, or urinal?: A Little Help from another person bathing (including washing, rinsing, drying)?: A Little Help from another person to put on and taking off regular upper body clothing?: A Little Help from another person to put on and taking off regular lower body clothing?: A Little 6 Click Score: 18    End of Session Equipment Utilized During Treatment: Oxygen (15L via NRB)  OT Visit Diagnosis: Other (comment) (decreased cardiopulmonary tolerance)   Activity Tolerance Patient tolerated treatment well   Patient Left with call bell/phone within reach;in chair   Nurse Communication Mobility status;Other (comment) (O2)        Time: 6433-2951 OT Time Calculation (min): 23 min  Charges: OT General Charges $OT Visit: 1 Visit OT Treatments $Self Care/Home Management : 8-22 mins  Shanena Pellegrino MSOT, OTR/L Acute Rehab Pager: [phone] Office: [phone]   Theodoro Grist Criston Chancellor 12/20/2019, 1:24 PM

## 2019-12-20 NOTE — Progress Notes (Signed)
ANTICOAGULATION CONSULT NOTE   Pharmacy Consult for Lovenox>apixaban Indication: atrial fibrillation  Allergies  Allergen Reactions  . Ancef [Cefazolin] Hives  . Aspirin Other (See Comments)    GI Bleed  . Doxycycline Diarrhea and Nausea And Vomiting  . Sulfa Antibiotics Hives    Possible loss of consciousness  . Ciprofloxacin Rash    "feels like bugs are crawling in my head"  . Lesinurad-Allopurinol Nausea And Vomiting  . Penicillins Rash    Patient Measurements: Height: 5\' 2"  (157.5 cm) Weight: 113.5 kg (250 lb 3.6 oz) IBW/kg (Calculated) : 50.1  Vital Signs: Temp: 97.8 F (36.6 C) (09/13 0728) Temp Source: Axillary (09/13 0728) BP: 114/68 (09/13 0728) Pulse Rate: 72 (09/13 0728)  Labs: Recent Labs    12/18/19 0410 12/18/19 0410 12/19/19 0434 12/20/19 0414  HGB 18.2*   < > 17.4* 17.5*  HCT 56.1*  --  52.9* 52.2*  PLT 377  --  415* 410*  CREATININE 0.72  --  0.81 0.81   < > = values in this interval not displayed.    Estimated Creatinine Clearance: 92.4 mL/min (by C-G formula based on SCr of 0.81 mg/dL).   Medical History: Past Medical History:  Diagnosis Date  . Anxiety   . Depression   . Diabetes mellitus without complication (HCC)   . GERD (gastroesophageal reflux disease)   . Hypertension   . Hypothyroidism   . Palpitations   . Rheumatoid arthritis (HCC)     Medications:  Scheduled:  . apixaban  5 mg Oral BID  . baricitinib  4 mg Oral Daily  . digoxin  0.125 mg Oral Daily  . diltiazem  60 mg Oral Q8H  . insulin aspart  0-20 Units Subcutaneous TID WC  . insulin aspart  0-5 Units Subcutaneous QHS  . insulin aspart  8 Units Subcutaneous TID WC  . insulin detemir  28 Units Subcutaneous BID  . Ipratropium-Albuterol  1 puff Inhalation Q6H  . linagliptin  5 mg Oral Daily  . methylPREDNISolone (SOLU-MEDROL) injection  40 mg Intravenous Q12H  . metoprolol tartrate  25 mg Oral QID  . potassium chloride  40 mEq Oral Once  . Ensure Max Protein  11  oz Oral BID  . thyroid  30 mg Oral QAC breakfast  . Vitamin D (Ergocalciferol)  50,000 Units Oral Q7 days    Assessment: 54 y.o. female admitted with acute hypoxemic respiratory failure secondary to COVID-19 pneumonia found to have new onset Afib. Pharmacy consulted to dose full dose Lovenox.   Lovenox will be transitioned to apixaban today per cards for afib. Age<80, wt>60kg, scr <1.5  CBC stable  Goal of Therapy:  Full anticoagulation with Lovenox Monitor platelets by anticoagulation protocol: Yes   Plan:   Dc lovenox Apixaban 5mg  PO BID Rx will follow peripherally  57, PharmD, BCIDP, AAHIVP, CPP Infectious Disease Pharmacist 12/20/2019 9:15 AM

## 2019-12-20 NOTE — Progress Notes (Signed)
Inpatient Diabetes Program Recommendations  AACE/ADA: New Consensus Statement on Inpatient Glycemic Control (2015)  Target Ranges:  Prepandial:   less than 140 mg/dL      Peak postprandial:   less than 180 mg/dL (1-2 hours)      Critically ill patients:  140 - 180 mg/dL   Lab Results  Component Value Date   GLUCAP 96 12/20/2019   HGBA1C 10.8 (H) 2019-12-26    Review of Glycemic Control Results for EILA, RUNYAN (MRN [id number]) as of 12/20/2019 15:14  Ref. Range 12/20/2019 02:57 12/20/2019 07:27 12/20/2019 09:24 12/20/2019 11:33  Glucose-Capillary Latest Ref Range: 70 - 99 mg/dL 641 (H) 61 (L) 74 96   Diabetes history: DM 2 Outpatient Diabetes medications:  None/patient reports Metformin Current orders for Inpatient glycemic control:  Novolog resistant tid with meals and HS Levemir 28 units bid Tradjenta 5 mg daily Solumedrol 40 mg IV q 12 hours Novolog 8 units tid with meals Inpatient Diabetes Program Recommendations:   Consider further reduction of Levemir to 15 units bid and reduce Novolog meal coverage to 4 units tid with meals.    Thanks  Beryl Meager, RN, BC-ADM Inpatient Diabetes Coordinator Pager [phone] (8a-5p)

## 2019-12-21 LAB — CBC
HCT: 47.9 % — ABNORMAL HIGH (ref 36.0–46.0)
Hemoglobin: 15.6 g/dL — ABNORMAL HIGH (ref 12.0–15.0)
MCH: 29.7 pg (ref 26.0–34.0)
MCHC: 32.6 g/dL (ref 30.0–36.0)
MCV: 91.1 fL (ref 80.0–100.0)
Platelets: 354 10*3/uL (ref 150–400)
RBC: 5.26 MIL/uL — ABNORMAL HIGH (ref 3.87–5.11)
RDW: 12.9 % (ref 11.5–15.5)
WBC: 10.9 10*3/uL — ABNORMAL HIGH (ref 4.0–10.5)
nRBC: 0 % (ref 0.0–0.2)

## 2019-12-21 LAB — COMPREHENSIVE METABOLIC PANEL
ALT: 64 U/L — ABNORMAL HIGH (ref 0–44)
AST: 44 U/L — ABNORMAL HIGH (ref 15–41)
Albumin: 2.6 g/dL — ABNORMAL LOW (ref 3.5–5.0)
Alkaline Phosphatase: 59 U/L (ref 38–126)
Anion gap: 13 (ref 5–15)
BUN: 24 mg/dL — ABNORMAL HIGH (ref 6–20)
CO2: 21 mmol/L — ABNORMAL LOW (ref 22–32)
Calcium: 7.9 mg/dL — ABNORMAL LOW (ref 8.9–10.3)
Chloride: 101 mmol/L (ref 98–111)
Creatinine, Ser: 0.77 mg/dL (ref 0.44–1.00)
GFR calc Af Amer: >60 mL/min (ref >60)
GFR calc non Af Amer: >60 mL/min (ref >60)
Glucose, Bld: 244 mg/dL — ABNORMAL HIGH (ref 70–99)
Potassium: 4 mmol/L (ref 3.5–5.1)
Sodium: 135 mmol/L (ref 135–145)
Total Bilirubin: 0.8 mg/dL (ref 0.3–1.2)
Total Protein: 5.8 g/dL — ABNORMAL LOW (ref 6.5–8.1)

## 2019-12-21 LAB — C-REACTIVE PROTEIN: CRP: 2.1 mg/dL — ABNORMAL HIGH (ref <1.0)

## 2019-12-21 LAB — GLUCOSE, CAPILLARY
Glucose-Capillary: 206 mg/dL — ABNORMAL HIGH (ref 70–99)
Glucose-Capillary: 220 mg/dL — ABNORMAL HIGH (ref 70–99)
Glucose-Capillary: 244 mg/dL — ABNORMAL HIGH (ref 70–99)
Glucose-Capillary: 313 mg/dL — ABNORMAL HIGH (ref 70–99)
Glucose-Capillary: 224 mg/dL — ABNORMAL HIGH (ref 70–99)

## 2019-12-21 LAB — MRSA PCR SCREENING: MRSA by PCR: NEGATIVE

## 2019-12-21 LAB — D-DIMER, QUANTITATIVE: D-Dimer, Quant: 0.35 ug/mL-FEU (ref 0.00–0.50)

## 2019-12-21 MED ORDER — FUROSEMIDE 10 MG/ML IJ SOLN
40.0000 mg | Freq: Once | INTRAMUSCULAR | Status: AC
  Administered 2019-12-21: 40 mg via INTRAVENOUS
  Filled 2019-12-21: qty 4

## 2019-12-21 MED ORDER — ORAL CARE MOUTH RINSE
15.0000 mL | Freq: Two times a day (BID) | OROMUCOSAL | Status: DC
  Administered 2019-12-21 – 2020-01-01 (×22): 15 mL via OROMUCOSAL

## 2019-12-21 NOTE — TOC Initial Note (Signed)
Transition of Care [facility]) - Initial/Assessment Note    Patient Details  Name: Linda Richmond MRN: [id number] Date of Birth: Sep 12, 1965  [facility] CM/SW Contact:    Lockie Pares, RN Phone Number: 12/21/2019, 9:02 AM  Clinical Narrative:                 Patient in with COVID LOS 10d. Continues on HF oxygen, PT evaluation reveals  No need for HH at this time, will likely need oxygen post hospitalization. Eligibility sent for eliquis. CM will continue to follow for needs. Patient has PCP.   Expected Discharge Plan: Home/Self Care Barriers to Discharge: Continued Medical Work up   Patient Goals and CMS Choice        Expected Discharge Plan and Services Expected Discharge Plan: Home/Self Care   Discharge Planning Services: CM Consult   Living arrangements for the past 2 months: Single Family Home                                      Prior Living Arrangements/Services Living arrangements for the past 2 months: Single Family Home   Patient language and need for interpreter reviewed:: Yes        Need for Family Participation in Patient Care: Yes (Comment) Care giver support system in place?: Yes (comment)   Criminal Activity/Legal Involvement Pertinent to Current Situation/Hospitalization: No - Comment as needed  Activities of Daily Living Home Assistive Devices/Equipment: Walker (specify type) ADL Screening (condition at time of admission) Patient's cognitive ability adequate to safely complete daily activities?: Yes Is the patient deaf or have difficulty hearing?: No Does the patient have difficulty seeing, even when wearing glasses/contacts?: No Does the patient have difficulty concentrating, remembering, or making decisions?: No Patient able to express need for assistance with ADLs?: Yes Does the patient have difficulty dressing or bathing?: No Independently performs ADLs?: Yes (appropriate for developmental age) Does the patient have  difficulty walking or climbing stairs?: No Weakness of Legs: None Weakness of Arms/Hands: None  Permission Sought/Granted Permission sought to share information with : Case Manager Permission granted to share information with : Yes, Verbal Permission Granted              Emotional Assessment       Orientation: : Oriented to Situation, Oriented to  Time, Oriented to Place, Oriented to Self Alcohol / Substance Use: Not Applicable Psych Involvement: No (comment)  Admission diagnosis:  Hypoxia [R09.02] Pneumonia due to COVID-19 virus [U07.1, J12.82] Acute respiratory disease due to COVID-19 virus [U07.1, J06.9] Patient Active Problem List   Diagnosis Date Noted  . Diabetes mellitus without complication (HCC)   . GERD (gastroesophageal reflux disease)   . Rheumatoid arthritis (HCC)   . Acute respiratory disease due to COVID-19 virus 01/06/2020  . Essential hypertension 12/09/2019  . Hypothyroid 12/18/2019  . Neck pain 04/11/2016  . Right shoulder pain 04/11/2016  . Palpitations 09/30/2013   PCP:  Ignatius Specking, MD Pharmacy:   [facility] - [place], [place] - [address] [place] [address] Phone: [phone] Fax: [phone]     Social Determinants of Health (SDOH) Interventions    Readmission Risk Interventions No flowsheet data found.

## 2019-12-21 NOTE — Progress Notes (Signed)
°   12/20/19 2134  Assess: MEWS Score  BP 131/69  Pulse Rate (!) 55  ECG Heart Rate (!) 54  SpO2 (!) 89 %  O2 Device Non-rebreather Mask  Patient Activity (if Appropriate) In bed  O2 Flow Rate (L/min) 15 L/min  Assess: MEWS Score  MEWS Temp 0  MEWS Systolic 0  MEWS Pulse 0  MEWS RR 0  MEWS LOC 0  MEWS Score 0  MEWS Score Color Green  Treat  Pain Scale 0-10  Pain Score 0  Notify: Provider  Provider Rikki Spearing, MD  Date Provider Notified 12/20/19  Time Provider Notified 2145  Notification Type Page  Notification Reason  (HR 46-55)  Response See new orders (Parameters added to Cardizem.)  Date of Provider Response 12/20/19  Time of Provider Response 2148   Patient asymptomatic. HS Metoprolol and HS Cardizem held. Will continue to monitor.

## 2019-12-21 NOTE — Progress Notes (Signed)
PROGRESS NOTE                                                                                                                                                                                                             Patient Demographics:    Linda Richmond, is a 54 y.o. female, DOB - 1966-04-01, ZOX:[id number]  Outpatient Primary MD for the patient is Ignatius Specking, MD   Admit date - Dec 12, 2019   LOS - 11  Chief Complaint  Patient presents with  . Shortness of Breath       Brief Narrative: Patient is a 54 y.o. female with PMHx of anxiety, depression, DM-2, HTN, seronegative RA-who tested positive for COVID-19 on 8/27-admitted to [facility] on 9/3 with acute hypoxemic respiratory failure secondary to COVID-19 pneumonia.  Unfortunately hospital course complicated by worsening hypoxemia requiring HFNC.  Transferred to [facility] for further evaluation and treatment.  See below for further details  COVID-19 vaccinated status: Unvaccinated  Significant Events: 8/27>> COVID-19 positive 9/3>> Admit to [facility] for hypoxemia secondary to COVID-19 9/6>> transferred to [facility]-worsening hypoxemia on 15 L of HFNC 9/8>> A. fib with RVR  Significant studies: 9/3>>Chest x-ray: New bilateral mid to lower lung patchy opacities 9/9>> Echo: EF 60-65%  COVID-19 medications: Steroids: 9/3>> Remdesivir: 9/3>> 9/7 Baricitinib: 9/5  Antibiotics: None  Microbiology data: 9/20 >>blood culture: 1/2-Staph epidermidis (likely contaminant)  Procedures: None  Consults: Cardiology  DVT prophylaxis: SCDs Start: 12-Dec-2019 1527 Eliquis    Subjective:   She denies any further epistaxis, but still reports feeling congested, nasal secretions still looks blood-tinged .   Assessment  & Plan :   Acute Hypoxic Resp Failure due to Covid 19 Viral pneumonia:  - Continues to have severe hypoxemia-stable on NRB-not on HFNC since 9/10  due to mild epistaxis.   -Remains on steroids -Remains on baricitinib.   -She was treated with remdesivir - No signs of volume overload.   -So far she has not been able to tolerate high flow nasal cannula, she is tolerating NRB and this is mainly due to nasal congestion, and epistaxis, there is no epistaxis for couple days now, but she still significantly congested. - I have encouraged her again to use incentive spirometry, flutter valve, to get out of bed to chair.   Fever: afebrile O2 requirements:  SpO2:  95 % O2 Flow Rate (L/min): 15 L/min (+ (Turned up past 15)) FiO2 (%): 94 %   COVID-19 Labs: Recent Labs    12/19/19 0434 12/20/19 0414 12/21/19 0637  DDIMER 0.27 <0.27 0.35  CRP 0.6 0.8 2.1*       Component Value Date/Time   BNP 20.9 12/15/2019 0555    No results for input(s): PROCALCITON in the last 168 hours.  Lab Results  Component Value Date   SARSCOV2NAA POSITIVE (A) 01-07-2020   SARSCOV2NAA Not Detected 04/13/2019   SARSCOV2NAA Not Detected 02/02/2019   SARSCOV2NAA Not Detected 01/22/2019      Prone/Incentive Spirometry: encouraged patient to lie prone for 3-4 hours at a time for a total of 16 hours a day, and to encourage incentive spirometry use 3-4/hour.  Epistaxis:  -This appears to be improving, she cannot tolerate HFNC, continue with NRB which has been helping with epistaxis as well  -So far has been stable, no recurrence for last couple days.  A. fib with RVR:  -Management per cardiology, she converted to normal sinus rhythm, currently off Cardizem drip, continue with digoxin and beta-blockers per cardiology.   -Transitioned from Lovenox to Eliquis .  DM-2 (A1c 10.8) with steroid induced hypoglycemia:  -Initially was significant insulin requirement, but she had couple low CBG readings, so her Levemir has decreased to 15 units twice daily, continue with Premeal NovoLog, and insulin sliding scale .  Recent Labs    12/20/19 2109 12/21/19 0322  12/21/19 0742  GLUCAP 265* 206* 220*   HTN: BP controlled on metoprolol.  No longer on Avapro.  Hypothyroidism: Continue thyroid Armour-TSH within normal limits  GERD: Continue PPI  Positive blood culture-1/2+ for staph epidermidis-likely represents a contaminant-no treatment required.  Nutrition Problem: Nutrition Problem: Increased nutrient needs Etiology: acute illness (COVID-19) Signs/Symptoms: estimated needs Interventions: Glucerna shake, MVI, Premier Protein  Morbid Obesity: Estimated body mass index is 44.92 kg/m as calculated from the following:   Height as of this encounter:  (1.575 m).   Weight as of this encounter: 111.4 kg.    ABG: No results found for: PHART, PCO2ART, PO2ART, HCO3, TCO2, ACIDBASEDEF, O2SAT  Vent Settings:     Condition - Extremely Guarded  Family Communication  :  Daughter Marchelle Folks [phone]) phone on . 9/12, 9/13, 9/14  Code Status :  Full Code  Diet :  Diet Order            Diet Carb Modified Fluid consistency: Thin; Room service appropriate? Yes  Diet effective now                  Disposition Plan  :   Status is: Inpatient  Remains inpatient appropriate because:Inpatient level of care appropriate due to severity of illness  Dispo: The patient is from: Home              Anticipated d/c is to: Home              Anticipated d/c date is: > 3 days              Patient currently is not medically stable to d/c.   Barriers to discharge: Hypoxia requiring O2 supplementation  Antimicorbials  :    Anti-infectives (From admission, onward)   Start     Dose/Rate Route Frequency Ordered Stop   12/11/19 1000  remdesivir 100 mg in sodium chloride 0.9 % 100 mL IVPB  Status:  Discontinued       "Followed by"  Linked Group Details   100 mg 200 mL/hr over 30 Minutes Intravenous Daily 12/19/2019 1529 12/20/2019 1532   12/11/19 1000  remdesivir 100 mg in sodium chloride 0.9 % 100 mL IVPB        100 mg 200 mL/hr over 30 Minutes  Intravenous Daily 01/01/2020 1533 12/14/19 0907   12/24/2019 1545  remdesivir 100 mg in sodium chloride 0.9 % 100 mL IVPB        100 mg 200 mL/hr over 30 Minutes Intravenous Every 30 min 12/19/2019 1533 12/29/2019 1850   12/24/2019 1530  remdesivir 200 mg in sodium chloride 0.9% 250 mL IVPB  Status:  Discontinued       "Followed by" Linked Group Details   200 mg 580 mL/hr over 30 Minutes Intravenous Once 12/08/2019 1529 01/02/2020 1532      Inpatient Medications  Scheduled Meds: . apixaban  5 mg Oral BID  . baricitinib  4 mg Oral Daily  . digoxin  0.125 mg Oral Daily  . diltiazem  60 mg Oral Q8H  . insulin aspart  0-20 Units Subcutaneous TID WC  . insulin aspart  0-5 Units Subcutaneous QHS  . insulin aspart  4 Units Subcutaneous TID WC  . insulin detemir  15 Units Subcutaneous BID  . Ipratropium-Albuterol  1 puff Inhalation Q6H  . linagliptin  5 mg Oral Daily  . mouth rinse  15 mL Mouth Rinse BID  . methylPREDNISolone (SOLU-MEDROL) injection  40 mg Intravenous Q12H  . metoprolol tartrate  25 mg Oral QID  . Ensure Max Protein  11 oz Oral BID  . thyroid  30 mg Oral QAC breakfast  . Vitamin D (Ergocalciferol)  50,000 Units Oral Q7 days   Continuous Infusions: . sodium chloride 250 mL (12/12/19 0851)   PRN Meds:.sodium chloride, acetaminophen, ALPRAZolam, chlorpheniramine-HYDROcodone, guaiFENesin-dextromethorphan, metoprolol tartrate, pantoprazole, sodium chloride   See all Orders from today for further details   Huey Bienenstock M.D on 12/21/2019 at 11:51 AM  To page go to [email or web address] - use universal password  [facility] -  Office  [phone]    Objective:   Vitals:   12/21/19 0419 12/21/19 0423 12/21/19 0554 12/21/19 0731  BP:   124/66 133/68  Pulse:  (!) 53  72  Resp:    20  Temp:    97.9 F (36.6 C)  TempSrc:    Oral  SpO2:  92% (!) 89% 95%  Weight: 111.4 kg     Height:        Wt Readings from Last 3 Encounters:  12/21/19 111.4 kg  10/25/16 121.1 kg    08/23/16 126.6 kg     Intake/Output Summary (Last 24 hours) at 12/21/2019 1151 Last data filed at 12/21/2019 1001 Gross per 24 hour  Intake 420 ml  Output 650 ml  Net -230 ml     Physical Exam  Awake Alert, Oriented X 3, No new F.N deficits, Normal affect Symmetrical Chest wall movement, Good air movement bilaterally, CTAB RRR,No Gallops,Rubs or new Murmurs, No Parasternal Heave +ve B.Sounds, Abd Soft, No tenderness, No rebound - guarding or rigidity. No Cyanosis, Clubbing or edema, No new Rash or bruise        Data Review:    CBC Recent Labs  Lab 12/15/19 0555 12/16/19 0334 12/17/19 1143 12/18/19 0410 12/19/19 0434 12/20/19 0414 12/21/19 0637  WBC 4.6   < > 10.5 9.6 11.7* 10.5 10.9*  HGB 15.3*   < > 17.1* 18.2* 17.4* 17.5* 15.6*  HCT 48.0*   < >  53.0* 56.1* 52.9* 52.2* 47.9*  PLT 268   < > 402* 377 415* 410* 354  MCV 92.1   < > 91.1 90.6 89.1 90.0 91.1  MCH 29.4   < > 29.4 29.4 29.3 30.2 29.7  MCHC 31.9   < > 32.3 32.4 32.9 33.5 32.6  RDW 12.9   < > 13.0 12.8 12.7 12.9 12.9  LYMPHSABS 0.6*  --   --   --   --   --   --   MONOABS 0.3  --   --   --   --   --   --   EOSABS 0.0  --   --   --   --   --   --   BASOSABS 0.0  --   --   --   --   --   --    < > = values in this interval not displayed.    Chemistries  Recent Labs  Lab 12/17/19 1143 12/18/19 0410 12/19/19 0434 12/20/19 0414 12/21/19 0637  NA 140 139 140 142 135  K 4.2 3.8 3.4* 3.3* 4.0  CL 102 104 104 105 101  CO2 26 20* 24 25 21*  GLUCOSE 254* 155* 97 90 244*  BUN 21* 22* 20 20 24*  CREATININE 0.82 0.72 0.81 0.81 0.77  CALCIUM 8.5* 8.2* 8.2* 8.2* 7.9*  AST 37 52* 55* 51* 44*  ALT 44 52* 65* 71* 64*  ALKPHOS 57 55 56 59 59  BILITOT 0.7 0.9 1.1 0.9 0.8   ------------------------------------------------------------------------------------------------------------------ No results for input(s): CHOL, HDL, LDLCALC, TRIG, CHOLHDL, LDLDIRECT in the last 72 hours.  Lab Results  Component  Value Date   HGBA1C 10.8 (H) 12/09/2019   ------------------------------------------------------------------------------------------------------------------ No results for input(s): TSH, T4TOTAL, T3FREE, THYROIDAB in the last 72 hours.  Invalid input(s): FREET3 ------------------------------------------------------------------------------------------------------------------ No results for input(s): VITAMINB12, FOLATE, FERRITIN, TIBC, IRON, RETICCTPCT in the last 72 hours.  Coagulation profile No results for input(s): INR, PROTIME in the last 168 hours.  Recent Labs    12/20/19 0414 12/21/19 0637  DDIMER <0.27 0.35    Cardiac Enzymes No results for input(s): CKMB, TROPONINI, MYOGLOBIN in the last 168 hours.  Invalid input(s): CK ------------------------------------------------------------------------------------------------------------------    Component Value Date/Time   BNP 20.9 12/15/2019 0555    Micro Results Recent Results (from the past 240 hour(s))  MRSA PCR Screening     Status: None   Collection Time: 12/20/19 11:22 PM   Specimen: Nasal Mucosa; Nasopharyngeal  Result Value Ref Range Status   MRSA by PCR NEGATIVE NEGATIVE Final    Comment:        The GeneXpert MRSA Assay (FDA approved for NASAL specimens only), is one component of a comprehensive MRSA colonization surveillance program. It is not intended to diagnose MRSA infection nor to guide or monitor treatment for MRSA infections. Performed at [facility], [address], [place], [place] [address]     Radiology Reports DG Chest [place] 1 View  Result Date: 12/14/2019 CLINICAL DATA:  Shortness of breath, COVID. EXAM: PORTABLE CHEST 1 VIEW COMPARISON:  Prior chest radiographs 12/29/2019 and earlier. FINDINGS: Shallow inspiration radiograph. The cardiomediastinal silhouette is unchanged. Similar to the prior examination of 12/19/2019, there are interstitial and ill-defined airspace opacities within  the left greater than right mid to lower lung fields. No definite pleural effusion. No evidence of pneumothorax. No acute bony abnormality identified. IMPRESSION: Shallow inspiration radiograph. Unchanged appearance of interstitial and ill-defined airspace opacities within the  left greater than right mid-to-lower lung fields. These findings likely reflect multifocal pneumonia given the provided history of COVID positivity. Electronically Signed   By: Jackey Loge DO   On: 12/14/2019 08:20   DG Chest [place] 1 View  Result Date: 12/25/2019 CLINICAL DATA:  COVID positive EXAM: PORTABLE CHEST 1 VIEW COMPARISON:  04/19/2019 chest radiograph and prior. FINDINGS: Hypoinflated lungs. Bilateral mid to lower lung patchy opacities, new since prior exam. No pneumothorax. Trace bilateral effusions. Cardiomediastinal silhouette within normal limits. IMPRESSION: New bilateral mid to lower lung patchy opacities. Hypoinflated lungs. Electronically Signed   By: Stana Bunting M.D.   On: 12/29/2019 13:57   ECHOCARDIOGRAM LIMITED  Result Date: 12/16/2019    ECHOCARDIOGRAM LIMITED REPORT   Patient Name:   Linda Richmond Date of Exam: 12/16/2019 Medical Rec #:  [id number]     Height:       62.0 in Accession #:    [id number]    Weight:       262.0 lb Date of Birth:  09-28-1965      BSA:          2.144 m Patient Age:    54 years      BP:           109/73 mmHg Patient Gender: F             HR:           131 bpm. Exam Location:  Inpatient Procedure: Limited Echo, Limited Color Doppler, Cardiac Doppler and Intracardiac            Opacification Agent Indications:    Acute Hypoxic Respiratory Failure  History:        Patient has prior history of Echocardiogram examinations, most                 recent 09/06/2016. Risk Factors:Hypertension and Diabetes. GERD.  Sonographer:    [name] RDCS Referring Phys: [id number] Deno Lunger [facility] IMPRESSIONS  1. Left ventricular ejection fraction, by estimation, is 60 to 65%. The left ventricle has  normal function. The left ventricle has no regional wall motion abnormalities. There is mild left ventricular hypertrophy. Left ventricular diastolic function could not be evaluated.  2. The mitral valve is grossly normal.  3. The aortic valve is tricuspid. Aortic valve regurgitation is not visualized. No aortic stenosis is present. Comparison(s): Prior images unable to be directly viewed, comparison made by report only. Conclusion(s)/Recommendation(s): Otherwise normal echocardiogram, with minor abnormalities described in the report. FINDINGS  Left Ventricle: Left ventricular ejection fraction, by estimation, is 60 to 65%. The left ventricle has normal function. The left ventricle has no regional wall motion abnormalities. Definity contrast agent was given IV to delineate the left ventricular  endocardial borders. There is mild left ventricular hypertrophy. Left ventricular diastolic function could not be evaluated. Left ventricular diastolic function could not be evaluated due to atrial fibrillation. Left Atrium: Left atrial size was normal in size. Right Atrium: Right atrial size was not well visualized. Pericardium: Trivial pericardial effusion is present. Mitral Valve: The mitral valve is grossly normal. Tricuspid Valve: The tricuspid valve is normal in structure. Tricuspid valve regurgitation is trivial. No evidence of tricuspid stenosis. Aortic Valve: The aortic valve is tricuspid. Aortic valve regurgitation is not visualized. No aortic stenosis is present. Pulmonic Valve: The pulmonic valve was not well visualized. Pulmonic valve regurgitation is not visualized. Aorta: The aortic root is normal in size and structure. Venous: The inferior vena cava was  not well visualized. IAS/Shunts: The atrial septum is grossly normal. LEFT VENTRICLE PLAX 2D LVIDd:         4.50 cm LVIDs:         2.80 cm LV PW:         1.10 cm LV IVS:        1.20 cm LVOT diam:     1.80 cm LV SV:         28 LV SV Index:   13 LVOT Area:      2.54 cm  LV Volumes (MOD) LV vol d, MOD A2C: 113.0 ml LV vol d, MOD A4C: 118.0 ml LV vol s, MOD A2C: 53.6 ml LV vol s, MOD A4C: 41.4 ml LV SV MOD A2C:     59.4 ml LV SV MOD A4C:     118.0 ml LV SV MOD BP:      71.7 ml LEFT ATRIUM             Index LA diam:        3.90 cm 1.82 cm/m LA Vol (A2C):   34.2 ml 15.95 ml/m LA Vol (A4C):   23.4 ml 10.91 ml/m LA Biplane Vol: 29.0 ml 13.53 ml/m  AORTIC VALVE LVOT Vmax:   83.60 cm/s LVOT Vmean:  49.400 cm/s LVOT VTI:    0.110 m  AORTA Ao Root diam: 2.90 cm MITRAL VALVE MV Area (PHT): 4.09 cm    SHUNTS MV Decel Time: 186 msec    Systemic VTI:  0.11 m MV E velocity: 72.40 cm/s  Systemic Diam: 1.80 cm Jodelle Red MD Electronically signed by Jodelle Red MD Signature Date/Time: 12/16/2019/9:51:17 PM    Final

## 2019-12-21 NOTE — Progress Notes (Addendum)
Patient in bed side-lying through the night. SpO2 has dropped to 78-84% several times d/t NRB mask coming off or patient laying on NRB bag. Staff had to adjust mask several times this shift. SpO2 would increase >85% rather quickly. Patient transferred to [facility] with SpO2 dropping as low as 75% with O2 15L NRB. SpO2 >85% after approximately 8 minutes. Patient resting in bed at this time. SpO2 90% on O2 15L NRB. Patient has not been up in chair this shift, but has dangled on the side of the bed for a few minutes. Patient has not proned, even after encouragement, but has been mostly side-lying. IS and flutter valve encouraged. Will continue to monitor.

## 2019-12-21 NOTE — TOC Benefit Eligibility Note (Signed)
Transition of Care [facility]) Benefit Eligibility Note    Patient Details  Name: Linda Richmond MRN: [id number] Date of Birth: [birth year]   Medication/Dose: Arne Cleveland  5 MG BID  Covered?: Yes  Tier: 3 Drug  Prescription Coverage Preferred Pharmacy: [place] with Person/Company/Phone Number:: JENNIFER  @  [employer]  YY # [phone] OPT-2  Co-Pay: $75.00  Prior Approval: No  Deductible: Met       Memory Argue Phone Number: 12/21/2019, 10:59 AM

## 2019-12-22 LAB — CBC
HCT: 46 % (ref 36.0–46.0)
Hemoglobin: 15.2 g/dL — ABNORMAL HIGH (ref 12.0–15.0)
MCH: 29.3 pg (ref 26.0–34.0)
MCHC: 33 g/dL (ref 30.0–36.0)
MCV: 88.6 fL (ref 80.0–100.0)
Platelets: 344 10*3/uL (ref 150–400)
RBC: 5.19 MIL/uL — ABNORMAL HIGH (ref 3.87–5.11)
RDW: 12.6 % (ref 11.5–15.5)
WBC: 12.7 10*3/uL — ABNORMAL HIGH (ref 4.0–10.5)
nRBC: 0 % (ref 0.0–0.2)

## 2019-12-22 LAB — COMPREHENSIVE METABOLIC PANEL
ALT: 65 U/L — ABNORMAL HIGH (ref 0–44)
AST: 38 U/L (ref 15–41)
Albumin: 2.6 g/dL — ABNORMAL LOW (ref 3.5–5.0)
Alkaline Phosphatase: 62 U/L (ref 38–126)
Anion gap: 13 (ref 5–15)
BUN: 30 mg/dL — ABNORMAL HIGH (ref 6–20)
CO2: 25 mmol/L (ref 22–32)
Calcium: 8.1 mg/dL — ABNORMAL LOW (ref 8.9–10.3)
Chloride: 98 mmol/L (ref 98–111)
Creatinine, Ser: 0.84 mg/dL (ref 0.44–1.00)
GFR calc Af Amer: >60 mL/min (ref >60)
GFR calc non Af Amer: >60 mL/min (ref >60)
Glucose, Bld: 228 mg/dL — ABNORMAL HIGH (ref 70–99)
Potassium: 4.1 mmol/L (ref 3.5–5.1)
Sodium: 136 mmol/L (ref 135–145)
Total Bilirubin: 0.9 mg/dL (ref 0.3–1.2)
Total Protein: 5.7 g/dL — ABNORMAL LOW (ref 6.5–8.1)

## 2019-12-22 LAB — GLUCOSE, CAPILLARY
Glucose-Capillary: 129 mg/dL — ABNORMAL HIGH (ref 70–99)
Glucose-Capillary: 138 mg/dL — ABNORMAL HIGH (ref 70–99)
Glucose-Capillary: 170 mg/dL — ABNORMAL HIGH (ref 70–99)
Glucose-Capillary: 228 mg/dL — ABNORMAL HIGH (ref 70–99)
Glucose-Capillary: 316 mg/dL — ABNORMAL HIGH (ref 70–99)

## 2019-12-22 LAB — C-REACTIVE PROTEIN: CRP: 1.8 mg/dL — ABNORMAL HIGH (ref <1.0)

## 2019-12-22 MED ORDER — LORATADINE 10 MG PO TABS
10.0000 mg | ORAL_TABLET | Freq: Every day | ORAL | Status: DC
  Administered 2019-12-22 – 2019-12-31 (×10): 10 mg via ORAL
  Filled 2019-12-22 (×11): qty 1

## 2019-12-22 MED ORDER — FLUTICASONE PROPIONATE 50 MCG/ACT NA SUSP
2.0000 | Freq: Every day | NASAL | Status: DC
  Administered 2019-12-22 – 2020-01-14 (×16): 2 via NASAL
  Filled 2019-12-22: qty 16

## 2019-12-22 MED ORDER — LIP MEDEX EX OINT
TOPICAL_OINTMENT | CUTANEOUS | Status: DC | PRN
  Filled 2019-12-22: qty 7

## 2019-12-22 MED ORDER — DILTIAZEM HCL ER COATED BEADS 120 MG PO CP24
120.0000 mg | ORAL_CAPSULE | Freq: Every day | ORAL | Status: DC
  Administered 2019-12-22 – 2019-12-31 (×10): 120 mg via ORAL
  Filled 2019-12-22 (×11): qty 1

## 2019-12-22 MED ORDER — PROSOURCE PLUS PO LIQD
30.0000 mL | Freq: Two times a day (BID) | ORAL | Status: DC
  Administered 2019-12-22 – 2019-12-26 (×5): 30 mL via ORAL
  Filled 2019-12-22 (×11): qty 30

## 2019-12-22 MED ORDER — GLUCERNA SHAKE PO LIQD
237.0000 mL | Freq: Three times a day (TID) | ORAL | Status: DC
  Administered 2019-12-22 – 2019-12-26 (×6): 237 mL via ORAL

## 2019-12-22 MED ORDER — METOPROLOL TARTRATE 12.5 MG HALF TABLET
50.0000 mg | ORAL_TABLET | Freq: Two times a day (BID) | ORAL | Status: DC
  Administered 2019-12-22 – 2019-12-31 (×16): 50 mg via ORAL
  Filled 2019-12-22 (×2): qty 1
  Filled 2019-12-22: qty 4
  Filled 2019-12-22: qty 1
  Filled 2019-12-22: qty 4
  Filled 2019-12-22 (×8): qty 1
  Filled 2019-12-22: qty 4
  Filled 2019-12-22 (×3): qty 1

## 2019-12-22 MED ORDER — PREDNISONE 20 MG PO TABS
40.0000 mg | ORAL_TABLET | Freq: Every day | ORAL | Status: DC
  Administered 2019-12-23 – 2019-12-27 (×5): 40 mg via ORAL
  Filled 2019-12-22 (×5): qty 2

## 2019-12-22 NOTE — Progress Notes (Signed)
Nutrition Follow-up  DOCUMENTATION CODES:   Morbid obesity  INTERVENTION:  Provide Glucerna Shake po TID, each supplement provides 220 kcal and 10 grams of protein.  Provide 30 ml Prosource plus po BID, each supplement provides 100 kcal and 15 grams of protein.   Encourage adequate PO intake.   NUTRITION DIAGNOSIS:   Increased nutrient needs related to acute illness (COVID-19) as evidenced by estimated needs; ongoing  GOAL:   Patient will meet greater than or equal to 90% of their needs; progressing  MONITOR:   PO intake, Supplement acceptance, Labs, Weight trends, Skin, I & O's  REASON FOR ASSESSMENT:   Malnutrition Screening Tool    ASSESSMENT:   Linda Richmond  is a 54 y.o. female, with past medical history of anxiety, depression, diabetes, GERD, hypertension, vitamin D deficiency, negative rheumatoid arthritis, she presents to ED secondary to shortness of breath, reported began yesterday, patient reports for last week she started to have Covid symptoms including generalized body ache, headache, runny nose, nasal congestion, she was tested positive for COVID-19 on 8/27, prescribed Z-Pak and prednisone, she has finished both without much improvement, reports she started to have dyspnea yesterday, her son is admitted to [facility] due to COVID-19 as well, patient is unvaccinated, patient denies any chest pain, hemoptysis.  Pt remains stable on 15 L NRB. Meal completion has been varied from 25-100%. Pt currently has Ensure Max ordered, however has been refusing them recently. RD to order Glucerna shake and Prosource plus instead to aid in caloric and protein needs. Pt encouraged to eat her food at meals and to drink her supplements. Labs and medications reviewed.   Diet Order:   Diet Order            Diet Carb Modified Fluid consistency: Thin; Room service appropriate? Yes  Diet effective now                 EDUCATION NEEDS:   No education needs have been  identified at this time  Skin:  Skin Assessment: Reviewed RN Assessment  Last BM:  9/14  Height:   Ht Readings from Last 1 Encounters:  01/01/2020 5\' 2"  (1.575 m)    Weight:   Wt Readings from Last 1 Encounters:  12/22/19 110.4 kg    Ideal Body Weight:  50 kg  BMI:  Body mass index is 44.52 kg/m.  Estimated Nutritional Needs:   Kcal:  1800-2000  Protein:  110-125 grams  Fluid:  > 1.8 L  12/24/19, MS, RD, LDN RD pager number/after hours weekend pager number on Amion.

## 2019-12-22 NOTE — Progress Notes (Signed)
PROGRESS NOTE                                                                                                                                                                                                             Patient Demographics:    Tara Rud, is a 54 y.o. female, DOB - Apr 05, 1966, NWG:[id number]  Outpatient Primary MD for the patient is Ignatius Specking, MD   Admit date - 01/02/2020   LOS - 12  Chief Complaint  Patient presents with  . Shortness of Breath       Brief Narrative: Patient is a 54 y.o. female with PMHx of anxiety, depression, DM-2, HTN, seronegative RA-who tested positive for COVID-19 on 8/27-admitted to [facility] on 9/3 with acute hypoxemic respiratory failure secondary to COVID-19 pneumonia.  Unfortunately hospital course complicated by worsening hypoxemia requiring HFNC.  Transferred to [facility] for further evaluation and treatment.  See below for further details  COVID-19 vaccinated status: Unvaccinated  Significant Events: 8/27>> COVID-19 positive 9/3>> Admit to [facility] for hypoxemia secondary to COVID-19 9/6>> transferred to [facility]-worsening hypoxemia on 15 L of HFNC 9/8>> A. fib with RVR  Significant studies: 9/3>>Chest x-ray: New bilateral mid to lower lung patchy opacities 9/9>> Echo: EF 60-65%  COVID-19 medications: Steroids: 9/3>> Remdesivir: 9/3>> 9/7 Baricitinib: 9/5  Antibiotics: None  Microbiology data: 9/20 >>blood culture: 1/2-Staph epidermidis (likely contaminant)  Procedures: None  Consults: Cardiology  DVT prophylaxis: SCDs Start: 01/01/2020 1527 Eliquis    Subjective:   No epistaxis-stable on 15 L of NRB-unable to tolerate nasal cannula due to nasal congestion.   Assessment  & Plan :   Acute Hypoxic Resp Failure due to Covid 19 Viral pneumonia: Stable on NRB-not able to tolerate nasal cannula due to nasal congestion and recent epistaxis.   Stop Solu-Medrol-transition to prednisone and begin to taper further.  Remains on baricitinib.  No signs of volume overload-do not think patient requires diuretics.  Continue supportive care-continue to slowly titrate down FiO2 over the next few days.  Fever: afebrile O2 requirements:  SpO2: 96 % O2 Flow Rate (L/min): 15 L/min FiO2 (%): 94 %   COVID-19 Labs: Recent Labs    12/20/19 0414 12/21/19 0637 12/22/19 1321  DDIMER <0.27 0.35  --   CRP 0.8 2.1* 1.8*       Component Value  Date/Time   BNP 20.9 12/15/2019 0555    No results for input(s): PROCALCITON in the last 168 hours.  Lab Results  Component Value Date   SARSCOV2NAA POSITIVE (A) 01/06/2020   SARSCOV2NAA Not Detected 04/13/2019   SARSCOV2NAA Not Detected 02/02/2019   SARSCOV2NAA Not Detected 01/22/2019      Prone/Incentive Spirometry: encouraged patient to lie prone for 3-4 hours at a time for a total of 16 hours a day, and to encourage incentive spirometry use 3-4/hour.  Epistaxis: No active epistaxis over the past few days-still has some dried blood in her right nasal cavity.  Continue supportive care.  A. fib with RVR: Back in sinus rhythm-we will consolidate Cardizem to 120 mg twice daily, change Lopressor to 50 mg twice daily-now on Eliquis.  Cardiology following remotely.  DM-2 (A1c 10.8) with steroid induced hyperglycemia: CBGs relatively stable-continue Levemir 15 units twice daily, 4 units of NovoLog with meals and SSI.  Suspect as steroid dosage gets tapered down further-we will require significantly less insulin.   Recent Labs    12/22/19 0247 12/22/19 0714 12/22/19 1200  GLUCAP 129* 138* 170*   HTN: BP controlled on metoprolol and Cardizem.  Hypothyroidism: Continue thyroid Armour-TSH within normal limits  GERD: Continue PPI  Positive blood culture-1/2+ for staph epidermidis-likely represents a contaminant-no treatment required.  Nutrition Problem: Nutrition Problem: Increased nutrient  needs Etiology: acute illness (COVID-19) Signs/Symptoms: estimated needs Interventions: Glucerna shake, MVI, Premier Protein  Morbid Obesity: Estimated body mass index is 44.52 kg/m as calculated from the following:   Height as of this encounter: 5\' 2"  (1.575 m).   Weight as of this encounter: 110.4 kg.    ABG: No results found for: PHART, PCO2ART, PO2ART, HCO3, TCO2, ACIDBASEDEF, O2SAT  Vent Settings:     Condition - Extremely Guarded  Family Communication  :  Daughter [phone]) phone on 9/15  Code Status :  Full Code  Diet :  Diet Order            Diet Carb Modified Fluid consistency: Thin; Room service appropriate? Yes  Diet effective now                  Disposition Plan  :   Status is: Inpatient  Remains inpatient appropriate because:Inpatient level of care appropriate due to severity of illness  Dispo: The patient is from: Home              Anticipated d/c is to: Home              Anticipated d/c date is: > 3 days              Patient currently is not medically stable to d/c.   Barriers to discharge: Hypoxia requiring O2 supplementation  Antimicorbials  :    Anti-infectives (From admission, onward)   Start     Dose/Rate Route Frequency Ordered Stop   12/11/19 1000  remdesivir 100 mg in sodium chloride 0.9 % 100 mL IVPB  Status:  Discontinued       "Followed by" Linked Group Details   100 mg 200 mL/hr over 30 Minutes Intravenous Daily 12/28/2019 1529 12/12/2019 1532   12/11/19 1000  remdesivir 100 mg in sodium chloride 0.9 % 100 mL IVPB        100 mg 200 mL/hr over 30 Minutes Intravenous Daily 01/05/2020 1533 12/14/19 0907   12/11/2019 1545  remdesivir 100 mg in sodium chloride 0.9 % 100 mL IVPB  100 mg 200 mL/hr over 30 Minutes Intravenous Every 30 min 12-19-2019 1533 December 19, 2019 1850   2019-12-19 1530  remdesivir 200 mg in sodium chloride 0.9% 250 mL IVPB  Status:  Discontinued       "Followed by" Linked Group Details   200 mg 580 mL/hr over  30 Minutes Intravenous Once Dec 19, 2019 1529 12-19-2019 1532      Inpatient Medications  Scheduled Meds: . apixaban  5 mg Oral BID  . baricitinib  4 mg Oral Daily  . digoxin  0.125 mg Oral Daily  . diltiazem  60 mg Oral Q8H  . insulin aspart  0-20 Units Subcutaneous TID WC  . insulin aspart  0-5 Units Subcutaneous QHS  . insulin aspart  4 Units Subcutaneous TID WC  . insulin detemir  15 Units Subcutaneous BID  . Ipratropium-Albuterol  1 puff Inhalation Q6H  . linagliptin  5 mg Oral Daily  . mouth rinse  15 mL Mouth Rinse BID  . methylPREDNISolone (SOLU-MEDROL) injection  40 mg Intravenous Q12H  . metoprolol tartrate  25 mg Oral QID  . Ensure Max Protein  11 oz Oral BID  . thyroid  30 mg Oral QAC breakfast  . Vitamin D (Ergocalciferol)  50,000 Units Oral Q7 days   Continuous Infusions: . sodium chloride 250 mL (12/12/19 0851)   PRN Meds:.sodium chloride, acetaminophen, ALPRAZolam, chlorpheniramine-HYDROcodone, guaiFENesin-dextromethorphan, lip balm, metoprolol tartrate, pantoprazole, sodium chloride   See all Orders from today for further details   Jeoffrey Massed M.D on 12/22/2019 at 2:34 PM  To page go to [email or web address] - use universal password  [facility] -  Office  [phone]    Objective:   Vitals:   12/22/19 0718 12/22/19 0744 12/22/19 0952 12/22/19 1233  BP:      Pulse:   63   Resp: 18     Temp: 97.8 F (36.6 C)     TempSrc: Axillary     SpO2:  92%  96%  Weight:      Height:        Wt Readings from Last 3 Encounters:  12/22/19 110.4 kg  10/25/16 121.1 kg  08/23/16 126.6 kg     Intake/Output Summary (Last 24 hours) at 12/22/2019 1434 Last data filed at 12/22/2019 0811 Gross per 24 hour  Intake 120 ml  Output 700 ml  Net -580 ml     Physical Exam Gen Exam:Alert awake-not in any distress HEENT:atraumatic, normocephalic Chest: B/L clear to auscultation anteriorly CVS:S1S2 regular Abdomen:soft non tender, non distended Extremities:no  edema Neurology: Non focal Skin: no rash   Data Review:    CBC Recent Labs  Lab 12/18/19 0410 12/19/19 0434 12/20/19 0414 12/21/19 0637 12/22/19 1321  WBC 9.6 11.7* 10.5 10.9* 12.7*  HGB 18.2* 17.4* 17.5* 15.6* 15.2*  HCT 56.1* 52.9* 52.2* 47.9* 46.0  PLT 377 415* 410* 354 344  MCV 90.6 89.1 90.0 91.1 88.6  MCH 29.4 29.3 30.2 29.7 29.3  MCHC 32.4 32.9 33.5 32.6 33.0  RDW 12.8 12.7 12.9 12.9 12.6    Chemistries  Recent Labs  Lab 12/18/19 0410 12/19/19 0434 12/20/19 0414 12/21/19 0637 12/22/19 1321  NA 139 140 142 135 136  K 3.8 3.4* 3.3* 4.0 4.1  CL 104 104 105 101 98  CO2 20* 24 25 21* 25  GLUCOSE 155* 97 90 244* 228*  BUN 22* 20 20 24* 30*  CREATININE 0.72 0.81 0.81 0.77 0.84  CALCIUM 8.2* 8.2* 8.2* 7.9* 8.1*  AST 52* 55* 51* 44* 38  ALT 52* 65* 71* 64* 65*  ALKPHOS 55 56 59 59 62  BILITOT 0.9 1.1 0.9 0.8 0.9   ------------------------------------------------------------------------------------------------------------------ No results for input(s): CHOL, HDL, LDLCALC, TRIG, CHOLHDL, LDLDIRECT in the last 72 hours.  Lab Results  Component Value Date   HGBA1C 10.8 (H) 12/14/2019   ------------------------------------------------------------------------------------------------------------------ No results for input(s): TSH, T4TOTAL, T3FREE, THYROIDAB in the last 72 hours.  Invalid input(s): FREET3 ------------------------------------------------------------------------------------------------------------------ No results for input(s): VITAMINB12, FOLATE, FERRITIN, TIBC, IRON, RETICCTPCT in the last 72 hours.  Coagulation profile No results for input(s): INR, PROTIME in the last 168 hours.  Recent Labs    12/20/19 0414 12/21/19 0637  DDIMER <0.27 0.35    Cardiac Enzymes No results for input(s): CKMB, TROPONINI, MYOGLOBIN in the last 168 hours.  Invalid input(s):  CK ------------------------------------------------------------------------------------------------------------------    Component Value Date/Time   BNP 20.9 12/15/2019 0555    Micro Results Recent Results (from the past 240 hour(s))  MRSA PCR Screening     Status: None   Collection Time: 12/20/19 11:22 PM   Specimen: Nasal Mucosa; Nasopharyngeal  Result Value Ref Range Status   MRSA by PCR NEGATIVE NEGATIVE Final    Comment:        The GeneXpert MRSA Assay (FDA approved for NASAL specimens only), is one component of a comprehensive MRSA colonization surveillance program. It is not intended to diagnose MRSA infection nor to guide or monitor treatment for MRSA infections. Performed at [facility], [address], [place], [place] [address]     Radiology Reports DG Chest [place] 1 View  Result Date: 12/14/2019 CLINICAL DATA:  Shortness of breath, COVID. EXAM: PORTABLE CHEST 1 VIEW COMPARISON:  Prior chest radiographs 12/29/2019 and earlier. FINDINGS: Shallow inspiration radiograph. The cardiomediastinal silhouette is unchanged. Similar to the prior examination of 12/21/2019, there are interstitial and ill-defined airspace opacities within the left greater than right mid to lower lung fields. No definite pleural effusion. No evidence of pneumothorax. No acute bony abnormality identified. IMPRESSION: Shallow inspiration radiograph. Unchanged appearance of interstitial and ill-defined airspace opacities within the left greater than right mid-to-lower lung fields. These findings likely reflect multifocal pneumonia given the provided history of COVID positivity. Electronically Signed   By: Jackey Loge DO   On: 12/14/2019 08:20   DG Chest [place] 1 View  Result Date: 12/18/2019 CLINICAL DATA:  COVID positive EXAM: PORTABLE CHEST 1 VIEW COMPARISON:  04/19/2019 chest radiograph and prior. FINDINGS: Hypoinflated lungs. Bilateral mid to lower lung patchy opacities, new since prior exam. No  pneumothorax. Trace bilateral effusions. Cardiomediastinal silhouette within normal limits. IMPRESSION: New bilateral mid to lower lung patchy opacities. Hypoinflated lungs. Electronically Signed   By: Stana Bunting M.D.   On: 12/18/2019 13:57   ECHOCARDIOGRAM LIMITED  Result Date: 12/16/2019    ECHOCARDIOGRAM LIMITED REPORT   Patient Name:   JARETTA BOL Date of Exam: 12/16/2019 Medical Rec #:  [id number]     Height:       62.0 in Accession #:    [id number]    Weight:       262.0 lb Date of Birth:  1965/07/16      BSA:          2.144 m Patient Age:    54 years      BP:           109/73 mmHg Patient Gender: F             HR:  131 bpm. Exam Location:  Inpatient Procedure: Limited Echo, Limited Color Doppler, Cardiac Doppler and Intracardiac            Opacification Agent Indications:    Acute Hypoxic Respiratory Failure  History:        Patient has prior history of Echocardiogram examinations, most                 recent 09/06/2016. Risk Factors:Hypertension and Diabetes. GERD.  Sonographer:    [name] RDCS Referring Phys: [id number] Deno Lunger [facility] IMPRESSIONS  1. Left ventricular ejection fraction, by estimation, is 60 to 65%. The left ventricle has normal function. The left ventricle has no regional wall motion abnormalities. There is mild left ventricular hypertrophy. Left ventricular diastolic function could not be evaluated.  2. The mitral valve is grossly normal.  3. The aortic valve is tricuspid. Aortic valve regurgitation is not visualized. No aortic stenosis is present. Comparison(s): Prior images unable to be directly viewed, comparison made by report only. Conclusion(s)/Recommendation(s): Otherwise normal echocardiogram, with minor abnormalities described in the report. FINDINGS  Left Ventricle: Left ventricular ejection fraction, by estimation, is 60 to 65%. The left ventricle has normal function. The left ventricle has no regional wall motion abnormalities. Definity contrast agent was  given IV to delineate the left ventricular  endocardial borders. There is mild left ventricular hypertrophy. Left ventricular diastolic function could not be evaluated. Left ventricular diastolic function could not be evaluated due to atrial fibrillation. Left Atrium: Left atrial size was normal in size. Right Atrium: Right atrial size was not well visualized. Pericardium: Trivial pericardial effusion is present. Mitral Valve: The mitral valve is grossly normal. Tricuspid Valve: The tricuspid valve is normal in structure. Tricuspid valve regurgitation is trivial. No evidence of tricuspid stenosis. Aortic Valve: The aortic valve is tricuspid. Aortic valve regurgitation is not visualized. No aortic stenosis is present. Pulmonic Valve: The pulmonic valve was not well visualized. Pulmonic valve regurgitation is not visualized. Aorta: The aortic root is normal in size and structure. Venous: The inferior vena cava was not well visualized. IAS/Shunts: The atrial septum is grossly normal. LEFT VENTRICLE PLAX 2D LVIDd:         4.50 cm LVIDs:         2.80 cm LV PW:         1.10 cm LV IVS:        1.20 cm LVOT diam:     1.80 cm LV SV:         28 LV SV Index:   13 LVOT Area:     2.54 cm  LV Volumes (MOD) LV vol d, MOD A2C: 113.0 ml LV vol d, MOD A4C: 118.0 ml LV vol s, MOD A2C: 53.6 ml LV vol s, MOD A4C: 41.4 ml LV SV MOD A2C:     59.4 ml LV SV MOD A4C:     118.0 ml LV SV MOD BP:      71.7 ml LEFT ATRIUM             Index LA diam:        3.90 cm 1.82 cm/m LA Vol (A2C):   34.2 ml 15.95 ml/m LA Vol (A4C):   23.4 ml 10.91 ml/m LA Biplane Vol: 29.0 ml 13.53 ml/m  AORTIC VALVE LVOT Vmax:   83.60 cm/s LVOT Vmean:  49.400 cm/s LVOT VTI:    0.110 m  AORTA Ao Root diam: 2.90 cm MITRAL VALVE MV Area (PHT): 4.09 cm    SHUNTS  MV Decel Time: 186 msec    Systemic VTI:  0.11 m MV E velocity: 72.40 cm/s  Systemic Diam: 1.80 cm Jodelle Red MD Electronically signed by Jodelle Red MD Signature Date/Time: 12/16/2019/9:51:17  PM    Final

## 2019-12-22 NOTE — Plan of Care (Signed)
  Problem: Education: Goal: Knowledge of General Education information will improve Description Including pain rating scale, medication(s)/side effects and non-pharmacologic comfort measures Outcome: Progressing   Problem: Health Behavior/Discharge Planning: Goal: Ability to manage health-related needs will improve Outcome: Progressing   Problem: Clinical Measurements: Goal: Will remain free from infection Outcome: Progressing   Problem: Nutrition: Goal: Adequate nutrition will be maintained Outcome: Progressing   Problem: Pain Managment: Goal: General experience of comfort will improve Outcome: Progressing   Problem: Safety: Goal: Ability to remain free from injury will improve Outcome: Progressing   

## 2019-12-22 NOTE — Progress Notes (Signed)
Physical Therapy Treatment Patient Details Name: Linda Richmond MRN: [id number] DOB: 1965-10-01 Today's Date: 12/22/2019    History of Present Illness Pt adm to [facility] 12/26/2019 with acute hypoxic respiratory failure due to covid PNA. Transferred to [facility] on 12/13/19. PMH - DM, RA, morbid obesity.   PT Comments    Pt slowly progressing with mobility; moving well, but remains limited by decreased activity tolerance, quick to desaturate with short bouts of activity. SpO2 down to 83% on 15L O2 NRB mask with activity, up to 93% with prolonged seated rest and deep breathing. Pt removed NRB briefly to do nasal drops and SpO2 quickly down to 73%. Pt remains motivated to participate and regain PLOF.      Follow Up Recommendations  No PT follow up (pending progression)     Equipment Recommendations   (TBD)    Recommendations for Other Services       Precautions / Restrictions Precautions Precautions: Other (comment) Precaution Comments: watch SpO2 (only using NRB -- no [place]; pt reports they told her not to due to "nose crusted") Restrictions Weight Bearing Restrictions: No    Mobility  Bed Mobility               General bed mobility comments: Received sitting in recliner  Transfers Overall transfer level: Independent Equipment used: None Transfers: Sit to/from [employer] transfer comment: Pt with good awareness of lines; performed 5x sit<>stand indep  Ambulation/Gait Ambulation/Gait assistance: Supervision Gait Distance (Feet): 8 Feet Assistive device: None Gait Pattern/deviations: Step-through pattern;Decreased stride length     General Gait Details: Steps forwards/backwards ~20 sec with supervision for safety with lines and SpO2 monitoring; poor activity tolerance; SpO2 down to 83% on 15+ L NRB mask   Stairs             Wheelchair Mobility    Modified Rankin (Stroke Patients Only)       Balance Overall balance assessment: No apparent balance  deficits (not formally assessed)                                          Cognition Arousal/Alertness: Awake/alert Behavior During Therapy: WFL for tasks assessed/performed Overall Cognitive Status: Within Functional Limits for tasks assessed                                 General Comments: Improving anxiety      Exercises General Exercises - Lower Extremity Long Arc Quad: AROM;Both;Seated Hip Flexion/Marching: AROM;Both;Seated Other Exercises Other Exercises: Multiple bouts of static standing (pt attempted marching but becoming to SOB); initially ~20-sec stand with prolonged seated rest, pt performed 4-5x more bout of 5-sec standing with better tolerance, still requiring >2-3 min seated rest breaks    General Comments General comments (skin integrity, edema, etc.): Pt on 15L NRB (has not been tolerating HFNC due to nose bleed). SpO2 down to 73% when mask briefly removed for nose drops. SpO2 down to 83% with activity, up to 93% with prolonged seated rest breaks and deep breathing      Pertinent Vitals/Pain Pain Assessment: No/denies pain    Home Living                      Prior Function  PT Goals (current goals can now be found in the care plan section) Progress towards PT goals: Progressing toward goals    Frequency    Min 3X/week      PT Plan      Co-evaluation              AM-PAC PT "6 Clicks" Mobility   Outcome Measure  Help needed turning from your back to your side while in a flat bed without using bedrails?: None Help needed moving from lying on your back to sitting on the side of a flat bed without using bedrails?: A Little Help needed moving to and from a bed to a chair (including a wheelchair)?: None Help needed standing up from a chair using your arms (e.g., wheelchair or bedside chair)?: None Help needed to walk in hospital room?: A Little Help needed climbing 3-5 steps with a railing? : A  Little 6 Click Score: 21    End of Session Equipment Utilized During Treatment: Oxygen Activity Tolerance: Patient tolerated treatment well;Treatment limited secondary to medical complications (Comment) Patient left: in chair;with call bell/phone within reach Nurse Communication: Mobility status PT Visit Diagnosis: Other abnormalities of gait and mobility (R26.89)     Time: 2426-8341 PT Time Calculation (min) (ACUTE ONLY): 24 min  Charges:  $Therapeutic Exercise: 8-22 mins $Therapeutic Activity: 8-22 mins                     Ina Homes, PT, DPT Acute Rehabilitation Services  Pager [phone] Office [phone]  Malachy Chamber 12/22/2019, 12:15 PM

## 2019-12-22 NOTE — Progress Notes (Signed)
Occupational Therapy Treatment Patient Details Name: Linda Richmond MRN: [id number] DOB: [birth year] Today's Date: 12/22/2019    History of present illness Pt adm to [facility] 12/14/2019 with acute hypoxic respiratory failure due to covid PNA. Transferred to [facility] on 12/13/19. PMH - DM, RA, morbid obesity.   OT comments  Pt seen for OT follow up session with focus on implementing level one UE theraband program. Pt on 15L NRB this date and up in chair. She continues ot present with anxiety surrounding cardiopulmonary status that is improved with cues and encouragement. Pt engaged in x2 exercises from theraband program due to fatigue and poor activity tolerance. After two exercises (described below) pt de sat to 74% with increased RR of mid 30s. Increased time, rest breaks, and cues for pursed lip breathing needed. Educated pt to take exercise program slowly to tolerance. Pt then engaged in table top grooming tasks with set up assist at end of session. She was issues a crossword puzzle to help facilitate positive coping skills. D/c recs remain appropriate, will continue to follow.   Pt on 15L NRB (cannot tolerate [place] due to nose bleeds). O2 sats ~74% with UE level one theraband program.   Follow Up Recommendations  No OT follow up;Supervision - Intermittent    Equipment Recommendations  Tub/shower seat    Recommendations for Other Services      Precautions / Restrictions Precautions Precautions: Other (comment) Precaution Comments: watch SpO2 (only using NRB -- no [place] due to nose bleeds) Restrictions Weight Bearing Restrictions: No       Mobility Bed Mobility               General bed mobility comments: up in chair; returned to chair  Transfers                      Balance                                           ADL either performed or assessed with clinical judgement   ADL       Grooming: Set up;Sitting Grooming Details (indicate cue type and reason): to  wash face with monitoring of O2 sats due to NRB having to be taken on/off                               General ADL Comments: Session focused on implementing UE theraband program. Pt continues to present with anxiety that impacts engagement in functional tasks     Vision Patient Visual Report: No change from baseline     Perception     Praxis      Cognition Arousal/Alertness: Awake/alert Behavior During Therapy: WFL for tasks assessed/performed                                   General Comments: Improving anxiety; continues to require cues and close monitoriung of screen to prevent beeping (which increases anxiety)        Exercises Other Exercises Other Exercises: UE: level one theraband program for UEs. Pt able to complete shoulder abduction x10 and SA diagonals x10 each UE. Required 3 rest breaks- increased anxiety and decreased sats to 74%.   Shoulder Instructions  General Comments      Pertinent Vitals/ Pain       Pain Assessment: No/denies pain  Home Living                                          Prior Functioning/Environment              Frequency  Min 2X/week        Progress Toward Goals  OT Goals(current goals can now be found in the care plan section)  Progress towards OT goals: Progressing toward goals  Acute Rehab OT Goals Patient Stated Goal: return home OT Goal Formulation: With patient Time For Goal Achievement: 12/28/19 Potential to Achieve Goals: Good  Plan Discharge plan remains appropriate    Co-evaluation                 AM-PAC OT "6 Clicks" Daily Activity     Outcome Measure   Help from another person eating meals?: A Little Help from another person taking care of personal grooming?: A Little Help from another person toileting, which includes using toliet, bedpan, or urinal?: A Little Help from another person bathing (including washing, rinsing, drying)?: A  Little Help from another person to put on and taking off regular upper body clothing?: A Little Help from another person to put on and taking off regular lower body clothing?: A Little 6 Click Score: 18    End of Session Equipment Utilized During Treatment: Oxygen  OT Visit Diagnosis: Other (comment) (decreased cardiopulmonary tolerance)   Activity Tolerance Patient tolerated treatment well   Patient Left with call bell/phone within reach;in chair   Nurse Communication Mobility status        Time: 9811-9147 OT Time Calculation (min): 17 min  Charges: OT General Charges $OT Visit: 1 Visit OT Treatments $Therapeutic Activity: 8-22 mins  Dalphine Handing, MSOT, OTR/L Acute Rehabilitation Services [employer] Office Number: [phone] Pager: [phone]  Dalphine Handing 12/22/2019, 6:03 PM

## 2019-12-23 ENCOUNTER — Inpatient Hospital Stay (HOSPITAL_COMMUNITY): Payer: 59

## 2019-12-23 ENCOUNTER — Inpatient Hospital Stay: Payer: Self-pay

## 2019-12-23 LAB — GLUCOSE, CAPILLARY
Glucose-Capillary: 238 mg/dL — ABNORMAL HIGH (ref 70–99)
Glucose-Capillary: 134 mg/dL — ABNORMAL HIGH (ref 70–99)
Glucose-Capillary: 224 mg/dL — ABNORMAL HIGH (ref 70–99)
Glucose-Capillary: 415 mg/dL — ABNORMAL HIGH (ref 70–99)
Glucose-Capillary: 418 mg/dL — ABNORMAL HIGH (ref 70–99)
Glucose-Capillary: 338 mg/dL — ABNORMAL HIGH (ref 70–99)
Glucose-Capillary: 292 mg/dL — ABNORMAL HIGH (ref 70–99)

## 2019-12-23 LAB — CBC
HCT: 43.9 % (ref 36.0–46.0)
Hemoglobin: 14.6 g/dL (ref 12.0–15.0)
MCH: 30.2 pg (ref 26.0–34.0)
MCHC: 33.3 g/dL (ref 30.0–36.0)
MCV: 90.7 fL (ref 80.0–100.0)
Platelets: 323 10*3/uL (ref 150–400)
RBC: 4.84 MIL/uL (ref 3.87–5.11)
RDW: 12.9 % (ref 11.5–15.5)
WBC: 13.2 10*3/uL — ABNORMAL HIGH (ref 4.0–10.5)
nRBC: 0 % (ref 0.0–0.2)

## 2019-12-23 MED ORDER — APIXABAN 5 MG PO TABS: 5.0000 mg | ORAL_TABLET | Freq: Two times a day (BID) | ORAL | 0 refills | Status: AC

## 2019-12-23 MED ORDER — SODIUM CHLORIDE 0.9% FLUSH
10.0000 mL | Freq: Two times a day (BID) | INTRAVENOUS | Status: DC
  Administered 2019-12-24 – 2020-01-01 (×11): 10 mL

## 2019-12-23 MED ORDER — CHLORHEXIDINE GLUCONATE CLOTH 2 % EX PADS
6.0000 | MEDICATED_PAD | Freq: Every day | CUTANEOUS | Status: DC
  Administered 2019-12-24 – 2020-01-14 (×24): 6 via TOPICAL

## 2019-12-23 MED ORDER — INSULIN ASPART 100 UNIT/ML ~~LOC~~ SOLN
6.0000 [IU] | Freq: Three times a day (TID) | SUBCUTANEOUS | Status: DC
  Administered 2019-12-23 – 2019-12-25 (×6): 6 [IU] via SUBCUTANEOUS

## 2019-12-23 MED ORDER — OXYMETAZOLINE HCL 0.05 % NA SOLN
3.0000 | Freq: Two times a day (BID) | NASAL | Status: DC | PRN
  Administered 2019-12-26 – 2020-01-11 (×2): 3 via NASAL
  Filled 2019-12-23: qty 30

## 2019-12-23 MED ORDER — SODIUM CHLORIDE 0.9% FLUSH
10.0000 mL | INTRAVENOUS | Status: DC | PRN
  Administered 2019-12-23 – 2019-12-26 (×2): 10 mL

## 2019-12-23 MED FILL — ELIQUIS 5 MG TABLET: 5 | 30 days supply | Qty: 60 | Fill #0

## 2019-12-23 NOTE — Progress Notes (Signed)
Inpatient Diabetes Program Recommendations  AACE/ADA: New Consensus Statement on Inpatient Glycemic Control (2015)  Target Ranges:  Prepandial:   less than 140 mg/dL      Peak postprandial:   less than 180 mg/dL (1-2 hours)      Critically ill patients:  140 - 180 mg/dL   Results for JOHARA, LODWICK (MRN [id number]) as of 12/23/2019 14:19  Ref. Range 12/22/2019 07:14 12/22/2019 12:00 12/22/2019 16:09 12/22/2019 20:53 12/23/2019 02:47  Glucose-Capillary Latest Ref Range: 70 - 99 mg/dL 092 (H)  7 units NOVOLOG +  15 units LEVEMIR  170 (H)  8 units NOVOLOG  228 (H)  11 units NOVOLOG  316 (H)  4 units NOVOLOG +  15 units LEVEMIR  238 (H)   Results for EBONIQUE, HALLSTROM (MRN [id number]) as of 12/23/2019 14:19  Ref. Range 12/23/2019 07:24 12/23/2019 11:26  Glucose-Capillary Latest Ref Range: 70 - 99 mg/dL 333 (H)  7 units NOVOLOG +  15 units LEVEMIR  224 (H)  11 units NOVOLOG     Home DM Meds: None listed  Current Orders: Levemir 15 units BID       Novolog 0-20 units ac/hs       Novolog 4 units TID with meals      Tradjenta 5 mg Daily    Solumedrol stopped--last dose given yest AM.  Now getting Prednisone 40 mg Daily.    MD- If you note pt continues to have elevated afternoon CBGs despite reduction of the Steroids, please consider increasing the Novolog Meal Coverage to 6 units TID with meals     --Will follow patient during hospitalization--  Ambrose Finland RN, MSN, CDE Diabetes Coordinator Inpatient Glycemic Control Team Team Pager: [phone] (8a-5p)

## 2019-12-23 NOTE — Progress Notes (Signed)
PROGRESS NOTE                                                                                                                                                                                                             Patient Demographics:    Linda Richmond, is a 54 y.o. female, DOB - 28-Sep-1965, CZY:[id number]  Outpatient Primary MD for the patient is Ignatius Specking, MD   Admit date - 12/24/2019   LOS - 13  Chief Complaint  Patient presents with  . Shortness of Breath       Brief Narrative: Patient is a 54 y.o. female with PMHx of anxiety, depression, DM-2, HTN, seronegative RA-who tested positive for COVID-19 on 8/27-admitted to [facility] on 9/3 with acute hypoxemic respiratory failure secondary to COVID-19 pneumonia.  Unfortunately hospital course complicated by worsening hypoxemia requiring HFNC.  Transferred to [facility] for further evaluation and treatment.  See below for further details  COVID-19 vaccinated status: Unvaccinated  Significant Events: 8/27>> COVID-19 positive 9/3>> Admit to [facility] for hypoxemia secondary to COVID-19 9/6>> transferred to [facility]-worsening hypoxemia on 15 L of HFNC 9/8>> A. fib with RVR  Significant studies: 9/3>>Chest x-ray: New bilateral mid to lower lung patchy opacities 9/9>> Echo: EF 60-65%  COVID-19 medications: Steroids: 9/3>> Remdesivir: 9/3>> 9/7 Baricitinib: 9/5  Antibiotics: None  Microbiology data: 9/20 >>blood culture: 1/2-Staph epidermidis (likely contaminant)  Procedures: None  Consults: Cardiology  DVT prophylaxis: SCDs Start: 12/29/2019 1527 Eliquis    Subjective:   Unchanged-short of breath with minimal activity-stable on 15 L of NRB.   Assessment  & Plan :   Acute Hypoxic Resp Failure due to Covid 19 Viral pneumonia: Continues to have severe hypoxemia-stable on 15 L of NRB-was not able to tolerate HFNC due to epistaxis/nasal  congestion-have asked RN to see if we can now transition her to HFNC.  Continue prednisone and baricitinib.  Continue to attempt to slowly titrate down FiO2.    Fever: afebrile O2 requirements:  SpO2: (!) 86 % O2 Flow Rate (L/min): 15 L/min FiO2 (%): 94 %   COVID-19 Labs: Recent Labs    12/21/19 0637 12/22/19 1321  DDIMER 0.35  --   CRP 2.1* 1.8*       Component Value Date/Time   BNP 20.9 12/15/2019 0555    No results for input(s):  PROCALCITON in the last 168 hours.  Lab Results  Component Value Date   SARSCOV2NAA POSITIVE (A) 12/11/2019   SARSCOV2NAA Not Detected 04/13/2019   SARSCOV2NAA Not Detected 02/02/2019   SARSCOV2NAA Not Detected 01/22/2019      Prone/Incentive Spirometry: encouraged patient to lie prone for 3-4 hours at a time for a total of 16 hours a day, and to encourage incentive spirometry use 3-4/hour.  Epistaxis: No active epistaxis over the past few days-still has some dried blood in her right nasal cavity.  Continue supportive care.  A. fib with RVR: Remains in sinus rhythm-continue Cardizem, Lopressor and digoxin.  Remains on Eliquis.  DM-2 (A1c 10.8) with steroid induced hyperglycemia: CBGs still on the higher side-steroids being slowly tapered down-increase Premeal NovoLog to 6 units, continue Levemir 15 units twice daily.  Continue to follow and adjust accordingly.     Recent Labs    12/23/19 0724 12/23/19 1126 12/23/19 1536  GLUCAP 134* 224* 415*   HTN: BP controlled on metoprolol and Cardizem.  Hypothyroidism: Continue thyroid [place]-TSH within normal limits  GERD: Continue PPI  Positive blood culture-1/2+ for staph epidermidis-likely represents a contaminant-no treatment required.  Other issue-very hard stick per RN and left leg-we will go ahead and place a PICC line.  Nutrition Problem: Nutrition Problem: Increased nutrient needs Etiology: acute illness (COVID-19) Signs/Symptoms: estimated needs Interventions: Glucerna shake,  MVI, Premier Protein  Morbid Obesity: Estimated body mass index is 44.52 kg/m as calculated from the following:   Height as of this encounter:  (1.575 m).   Weight as of this encounter: 110.4 kg.    ABG: No results found for: PHART, PCO2ART, PO2ART, HCO3, TCO2, ACIDBASEDEF, O2SAT  Vent Settings:     Condition - Extremely Guarded  Family Communication  :  Daughter Marchelle Folks [phone]) phone on 9/16  Code Status :  Full Code  Diet :  Diet Order            Diet Carb Modified Fluid consistency: Thin; Room service appropriate? Yes  Diet effective now                  Disposition Plan  :   Status is: Inpatient  Remains inpatient appropriate because:Inpatient level of care appropriate due to severity of illness  Dispo: The patient is from: Home              Anticipated d/c is to: Home              Anticipated d/c date is: > 3 days              Patient currently is not medically stable to d/c.   Barriers to discharge: Hypoxia requiring O2 supplementation  Antimicorbials  :    Anti-infectives (From admission, onward)   Start     Dose/Rate Route Frequency Ordered Stop   12/11/19 1000  remdesivir 100 mg in sodium chloride 0.9 % 100 mL IVPB  Status:  Discontinued       "Followed by" Linked Group Details   100 mg 200 mL/hr over 30 Minutes Intravenous Daily 12/17/2019 1529 12/16/2019 1532   12/11/19 1000  remdesivir 100 mg in sodium chloride 0.9 % 100 mL IVPB        100 mg 200 mL/hr over 30 Minutes Intravenous Daily 12/18/2019 1533 12/14/19 0907   01/06/2020 1545  remdesivir 100 mg in sodium chloride 0.9 % 100 mL IVPB        100 mg 200 mL/hr over  30 Minutes Intravenous Every 30 min 12/12/2019 1533 01/03/2020 1850   12/23/2019 1530  remdesivir 200 mg in sodium chloride 0.9% 250 mL IVPB  Status:  Discontinued       "Followed by" Linked Group Details   200 mg 580 mL/hr over 30 Minutes Intravenous Once 12/19/2019 1529 12/20/2019 1532      Inpatient Medications  Scheduled Meds: .  (feeding supplement) PROSource Plus  30 mL Oral BID BM  . apixaban  5 mg Oral BID  . baricitinib  4 mg Oral Daily  . digoxin  0.125 mg Oral Daily  . diltiazem  120 mg Oral Daily  . feeding supplement (GLUCERNA SHAKE)  237 mL Oral TID BM  . fluticasone  2 spray Each Nare Daily  . insulin aspart  0-20 Units Subcutaneous TID WC  . insulin aspart  0-5 Units Subcutaneous QHS  . insulin aspart  4 Units Subcutaneous TID WC  . insulin detemir  15 Units Subcutaneous BID  . Ipratropium-Albuterol  1 puff Inhalation Q6H  . linagliptin  5 mg Oral Daily  . loratadine  10 mg Oral Daily  . mouth rinse  15 mL Mouth Rinse BID  . metoprolol tartrate  50 mg Oral BID  . predniSONE  40 mg Oral Q breakfast  . thyroid  30 mg Oral QAC breakfast  . Vitamin D (Ergocalciferol)  50,000 Units Oral Q7 days   Continuous Infusions: . sodium chloride 250 mL (12/12/19 0851)   PRN Meds:.sodium chloride, acetaminophen, ALPRAZolam, chlorpheniramine-HYDROcodone, guaiFENesin-dextromethorphan, lip balm, metoprolol tartrate, oxymetazoline, pantoprazole, sodium chloride   See all Orders from today for further details   Jeoffrey Massed M.D on 12/23/2019 at 4:16 PM  To page go to [email or web address] - use universal password  [facility] -  Office  [phone]    Objective:   Vitals:   12/23/19 0500 12/23/19 0733 12/23/19 1135 12/23/19 1558  BP: 140/72 129/65 115/62 (!) 116/59  Pulse: 67 92 65 76  Resp: 20 20    Temp: 98.1 F (36.7 C) 98.2 F (36.8 C) 99.3 F (37.4 C) 98.4 F (36.9 C)  TempSrc: Axillary Oral Oral Oral  SpO2: 90% 91% (!) 88% (!) 86%  Weight: 110.4 kg     Height:        Wt Readings from Last 3 Encounters:  12/23/19 110.4 kg  10/25/16 121.1 kg  08/23/16 126.6 kg     Intake/Output Summary (Last 24 hours) at 12/23/2019 1616 Last data filed at 12/23/2019 0911 Gross per 24 hour  Intake 420 ml  Output --  Net 420 ml     Physical Exam Gen Exam:Alert awake-not in any  distress HEENT:atraumatic, normocephalic Chest: B/L clear to auscultation anteriorly CVS:S1S2 regular Abdomen:soft non tender, non distended Extremities:no edema Neurology: Non focal Skin: no rash  Data Review:    CBC Recent Labs  Lab 12/18/19 0410 12/19/19 0434 12/20/19 0414 12/21/19 0637 12/22/19 1321  WBC 9.6 11.7* 10.5 10.9* 12.7*  HGB 18.2* 17.4* 17.5* 15.6* 15.2*  HCT 56.1* 52.9* 52.2* 47.9* 46.0  PLT 377 415* 410* 354 344  MCV 90.6 89.1 90.0 91.1 88.6  MCH 29.4 29.3 30.2 29.7 29.3  MCHC 32.4 32.9 33.5 32.6 33.0  RDW 12.8 12.7 12.9 12.9 12.6    Chemistries  Recent Labs  Lab 12/18/19 0410 12/19/19 0434 12/20/19 0414 12/21/19 0637 12/22/19 1321  NA 139 140 142 135 136  K 3.8 3.4* 3.3* 4.0 4.1  CL 104 104 105 101 98  CO2 20* 24 25 21*  25  GLUCOSE 155* 97 90 244* 228*  BUN 22* 20 20 24* 30*  CREATININE 0.72 0.81 0.81 0.77 0.84  CALCIUM 8.2* 8.2* 8.2* 7.9* 8.1*  AST 52* 55* 51* 44* 38  ALT 52* 65* 71* 64* 65*  ALKPHOS 55 56 59 59 62  BILITOT 0.9 1.1 0.9 0.8 0.9   ------------------------------------------------------------------------------------------------------------------ No results for input(s): CHOL, HDL, LDLCALC, TRIG, CHOLHDL, LDLDIRECT in the last 72 hours.  Lab Results  Component Value Date   HGBA1C 10.8 (H) 01/02/2020   ------------------------------------------------------------------------------------------------------------------ No results for input(s): TSH, T4TOTAL, T3FREE, THYROIDAB in the last 72 hours.  Invalid input(s): FREET3 ------------------------------------------------------------------------------------------------------------------ No results for input(s): VITAMINB12, FOLATE, FERRITIN, TIBC, IRON, RETICCTPCT in the last 72 hours.  Coagulation profile No results for input(s): INR, PROTIME in the last 168 hours.  Recent Labs    12/21/19 0637  DDIMER 0.35    Cardiac Enzymes No results for input(s): CKMB, TROPONINI,  MYOGLOBIN in the last 168 hours.  Invalid input(s): CK ------------------------------------------------------------------------------------------------------------------    Component Value Date/Time   BNP 20.9 12/15/2019 0555    Micro Results Recent Results (from the past 240 hour(s))  MRSA PCR Screening     Status: None   Collection Time: 12/20/19 11:22 PM   Specimen: Nasal Mucosa; Nasopharyngeal  Result Value Ref Range Status   MRSA by PCR NEGATIVE NEGATIVE Final    Comment:        The GeneXpert MRSA Assay (FDA approved for NASAL specimens only), is one component of a comprehensive MRSA colonization surveillance program. It is not intended to diagnose MRSA infection nor to guide or monitor treatment for MRSA infections. Performed at [facility], [address] [place], [place] [address]     Radiology Reports DG Chest Port 1 View  Result Date: 12/23/2019 CLINICAL DATA:  Shortness of breath EXAM: PORTABLE CHEST 1 VIEW COMPARISON:  December 14, 2019 FINDINGS: There is airspace opacity throughout both mid and lower lung regions, similar to prior study. No consolidation. Heart is borderline enlarged with pulmonary vascularity normal. No adenopathy. No bone lesions. IMPRESSION: Airspace opacity throughout both mid and lower lung zones, similar to prior study, likely due to atypical organism pneumonia. No consolidation. Stable cardiac silhouette. No evident adenopathy. Electronically Signed   By: Bretta Bang III M.D.   On: 12/23/2019 08:21   DG Chest Port 1 View  Result Date: 12/14/2019 CLINICAL DATA:  Shortness of breath, COVID. EXAM: PORTABLE CHEST 1 VIEW COMPARISON:  Prior chest radiographs 12/15/2019 and earlier. FINDINGS: Shallow inspiration radiograph. The cardiomediastinal silhouette is unchanged. Similar to the prior examination of 12/23/2019, there are interstitial and ill-defined airspace opacities within the left greater than right mid to lower lung fields. No  definite pleural effusion. No evidence of pneumothorax. No acute bony abnormality identified. IMPRESSION: Shallow inspiration radiograph. Unchanged appearance of interstitial and ill-defined airspace opacities within the left greater than right mid-to-lower lung fields. These findings likely reflect multifocal pneumonia given the provided history of COVID positivity. Electronically Signed   By: Jackey Loge DO   On: 12/14/2019 08:20   DG Chest Port 1 View  Result Date: 12/26/2019 CLINICAL DATA:  COVID positive EXAM: PORTABLE CHEST 1 VIEW COMPARISON:  04/19/2019 chest radiograph and prior. FINDINGS: Hypoinflated lungs. Bilateral mid to lower lung patchy opacities, new since prior exam. No pneumothorax. Trace bilateral effusions. Cardiomediastinal silhouette within normal limits. IMPRESSION: New bilateral mid to lower lung patchy opacities. Hypoinflated lungs. Electronically Signed   By: Thelma Barge.D.  On: 12/12/2019 13:57   ECHOCARDIOGRAM LIMITED  Result Date: 12/16/2019    ECHOCARDIOGRAM LIMITED REPORT   Patient Name:   Linda Richmond Date of Exam: 12/16/2019 Medical Rec #:  [id number]     Height:       62.0 in Accession #:    [id number]    Weight:       262.0 lb Date of Birth:  1965/10/25      BSA:          2.144 m Patient Age:    54 years      BP:           109/73 mmHg Patient Gender: F             HR:           131 bpm. Exam Location:  Inpatient Procedure: Limited Echo, Limited Color Doppler, Cardiac Doppler and Intracardiac            Opacification Agent Indications:    Acute Hypoxic Respiratory Failure  History:        Patient has prior history of Echocardiogram examinations, most                 recent 09/06/2016. Risk Factors:Hypertension and Diabetes. GERD.  Sonographer:    [name] RDCS Referring Phys: [id number] Deno Lunger [facility] IMPRESSIONS  1. Left ventricular ejection fraction, by estimation, is 60 to 65%. The left ventricle has normal function. The left ventricle has no regional wall  motion abnormalities. There is mild left ventricular hypertrophy. Left ventricular diastolic function could not be evaluated.  2. The mitral valve is grossly normal.  3. The aortic valve is tricuspid. Aortic valve regurgitation is not visualized. No aortic stenosis is present. Comparison(s): Prior images unable to be directly viewed, comparison made by report only. Conclusion(s)/Recommendation(s): Otherwise normal echocardiogram, with minor abnormalities described in the report. FINDINGS  Left Ventricle: Left ventricular ejection fraction, by estimation, is 60 to 65%. The left ventricle has normal function. The left ventricle has no regional wall motion abnormalities. Definity contrast agent was given IV to delineate the left ventricular  endocardial borders. There is mild left ventricular hypertrophy. Left ventricular diastolic function could not be evaluated. Left ventricular diastolic function could not be evaluated due to atrial fibrillation. Left Atrium: Left atrial size was normal in size. Right Atrium: Right atrial size was not well visualized. Pericardium: Trivial pericardial effusion is present. Mitral Valve: The mitral valve is grossly normal. Tricuspid Valve: The tricuspid valve is normal in structure. Tricuspid valve regurgitation is trivial. No evidence of tricuspid stenosis. Aortic Valve: The aortic valve is tricuspid. Aortic valve regurgitation is not visualized. No aortic stenosis is present. Pulmonic Valve: The pulmonic valve was not well visualized. Pulmonic valve regurgitation is not visualized. Aorta: The aortic root is normal in size and structure. Venous: The inferior vena cava was not well visualized. IAS/Shunts: The atrial septum is grossly normal. LEFT VENTRICLE PLAX 2D LVIDd:         4.50 cm LVIDs:         2.80 cm LV PW:         1.10 cm LV IVS:        1.20 cm LVOT diam:     1.80 cm LV SV:         28 LV SV Index:   13 LVOT Area:     2.54 cm  LV Volumes (MOD) LV vol d, MOD A2C: 113.0 ml LV  vol d, MOD  A4C: 118.0 ml LV vol s, MOD A2C: 53.6 ml LV vol s, MOD A4C: 41.4 ml LV SV MOD A2C:     59.4 ml LV SV MOD A4C:     118.0 ml LV SV MOD BP:      71.7 ml LEFT ATRIUM             Index LA diam:        3.90 cm 1.82 cm/m LA Vol (A2C):   34.2 ml 15.95 ml/m LA Vol (A4C):   23.4 ml 10.91 ml/m LA Biplane Vol: 29.0 ml 13.53 ml/m  AORTIC VALVE LVOT Vmax:   83.60 cm/s LVOT Vmean:  49.400 cm/s LVOT VTI:    0.110 m  AORTA Ao Root diam: 2.90 cm MITRAL VALVE MV Area (PHT): 4.09 cm    SHUNTS MV Decel Time: 186 msec    Systemic VTI:  0.11 m MV E velocity: 72.40 cm/s  Systemic Diam: 1.80 cm Jodelle Red MD Electronically signed by Jodelle Red MD Signature Date/Time: 12/16/2019/9:51:17 PM    Final    [place] EKG Site Rite  Result Date: 12/23/2019 If Site Rite image not attached, placement could not be confirmed due to current cardiac rhythm.

## 2019-12-23 NOTE — Progress Notes (Signed)
Patient's CBG elevated 415. Per MD patient will receive 20 units Novolog and meal coverage and we will recheck CBG.

## 2019-12-23 NOTE — Progress Notes (Signed)
Peripherally Inserted Central Catheter Placement  The IV Nurse has discussed with the patient and/or persons authorized to consent for the patient, the purpose of this procedure and the potential benefits and risks involved with this procedure.  The benefits include less needle sticks, lab draws from the catheter, and the patient may be discharged home with the catheter. Risks include, but not limited to, infection, bleeding, blood clot (thrombus formation), and puncture of an artery; nerve damage and irregular heartbeat and possibility to perform a PICC exchange if needed/ordered by physician.  Alternatives to this procedure were also discussed.  Bard Power PICC patient education guide, fact sheet on infection prevention and patient information card has been provided to patient /or left at bedside.    PICC Placement Documentation  PICC Single Lumen 12/23/19 PICC Right Cephalic 34 cm 0 cm (Active)  Indication for Insertion or Continuance of Line Limited venous access - need for IV therapy >5 days (PICC only) 12/23/19 1752  Exposed Catheter (cm) 0 cm 12/23/19 1752  Site Assessment Clean;Dry;Intact 12/23/19 1752  Line Status Flushed;Saline locked;Blood return noted 12/23/19 1752  Dressing Type Transparent 12/23/19 1752  Dressing Status Clean;Dry;Intact;Antimicrobial disc in place 12/23/19 1752  Safety Lock Not Applicable 12/23/19 1752  Dressing Intervention New dressing 12/23/19 1752  Dressing Change Due 12/30/19 12/23/19 1752       Ethelda Chick 12/23/2019, 5:53 PM

## 2019-12-23 NOTE — TOC Progression Note (Signed)
Transition of Care [facility]) - Progression Note    Patient Details  Name: Linda Richmond MRN: [id number] Date of Birth: 1965-07-03  Transition of Care [facility]) CM/SW Contact  Lockie Pares, RN Phone Number: 12/23/2019, 8:53 AM  Clinical Narrative:    Day 13 still with high oxygen requirement. NO HH needs at this time. CM will continue to follow for needs, likely need oxygen for home use post hospitalization.    Expected Discharge Plan: Home/Self Care Barriers to Discharge: Continued Medical Work up  Expected Discharge Plan and Services Expected Discharge Plan: Home/Self Care   Discharge Planning Services: CM Consult   Living arrangements for the past 2 months: Single Family Home                                       Social Determinants of Health (SDOH) Interventions    Readmission Risk Interventions No flowsheet data found.

## 2019-12-23 NOTE — Progress Notes (Signed)
Physical Therapy Treatment Patient Details Name: Linda Richmond MRN: [id number] DOB: 12-12-1965 Today's Date: 12/23/2019    History of Present Illness Linda Richmond is a 54 y.o. female who tested (+) COVID-19 on 12/03/19, now admitted to [facility] 12/30/2019 with acute hypoxic respiratory failure due to covid PNA. Transferred to [facility] on 12/13/19 due to worsening hypoxemia. Afib with RVR 9/8. PMH includes DM, RA, morbid obesity.   Linda Richmond Comments    Linda Richmond not progressing with mobility this session. Limited by fatigue, SOB and anxiety at rest and with activity. SpO2 90-92% on 15L O2 NRB at rest, down to 82% with brief bouts of seated/standing activity. Educ on importance of mobility, activity recommendations and frequency.    Follow Up Recommendations  No Linda Richmond follow up (pending progression)     Equipment Recommendations   (TBD)    Recommendations for Other Services       Precautions / Restrictions Precautions Precautions: Other (comment) Precaution Comments: watch SpO2 (only using NRB -- no Cornelia due to nose bleeds) Restrictions Weight Bearing Restrictions: No    Mobility  Bed Mobility               General bed mobility comments: received sitting in recliner  Transfers Overall transfer level: Independent Equipment used: None Transfers: Sit to/from Stand              Ambulation/Gait             General Gait Details: Linda Richmond declined   Social research officer, government Rank[employer] (Stroke Patients Only)       Balance Overall balance assessment: No apparent balance deficits (not formally assessed)                                          Cognition Arousal/Alertness: Awake/alert Behavior During Therapy: WFL for tasks assessed/performed;Anxious Overall Cognitive Status: Within Functional Limits for tasks assessed                                 General Comments: Linda Richmond self-reports feeling overwhelmed with increased anxiety secondary to  amount of people in and out of room this morning -- Linda Richmond states, "I just need a minute to calm down"      Exercises Other Exercises Other Exercises: unsupported sitting edge of chair w/ ankle pumps and seated hip flex; 1x bout 8-sec static standing; >3 min rest to recover and Linda Richmond ultimately declining further activity    General Comments General comments (skin integrity, edema, etc.): SpO2 down to 82% on 15L O2 via NRB with activity. Linda Richmond with significantly decreased activity tolerance this session, only able to perform seated activity and 1x bout of standing before stating, "This isn't going well" and declining further activity. Encouragement and supportive listening provided      Pertinent Vitals/Pain Pain Assessment: Faces Faces Pain Scale: Hurts a little bit Pain Location: chest Pain Descriptors / Indicators: Tightness Pain Intervention(s): Monitored during session    Home Living                      Prior Function            Linda Richmond Goals (current goals can now be found in the care plan section) Progress towards Linda Richmond goals:  Not progressing toward goals - comment (limited by hypoxia, SOB, anxiety)    Frequency    Min 3X/week      Linda Richmond Plan Current plan remains appropriate    Co-evaluation              AM-PAC Linda Richmond "6 Clicks" Mobility   Outcome Measure  Help needed turning from your back to your side while in a flat bed without using bedrails?: None Help needed moving from lying on your back to sitting on the side of a flat bed without using bedrails?: A Little Help needed moving to and from a bed to a chair (including a wheelchair)?: None Help needed standing up from a chair using your arms (e.g., wheelchair or bedside chair)?: None Help needed to walk in hospital room?: A Little Help needed climbing 3-5 steps with a railing? : A Little 6 Click Score: 21    End of Session Equipment Utilized During Treatment: Oxygen Activity Tolerance: Patient limited by  fatigue;Treatment limited secondary to medical complications (Comment) Patient left: in chair;with call bell/phone within reach;with nursing/sitter in room Nurse Communication: Mobility status Linda Richmond Visit Diagnosis: Other abnormalities of gait and mobility (R26.89)     Time: 2951-8841 Linda Richmond Time Calculation (min) (ACUTE ONLY): 13 min  Charges:  $Therapeutic Activity: 8-22 mins                     Linda Richmond, Linda Richmond, Linda Richmond Acute Rehabilitation Services  Pager [phone] Office [phone]  Malachy Chamber 12/23/2019, 10:29 AM

## 2019-12-24 ENCOUNTER — Inpatient Hospital Stay (HOSPITAL_COMMUNITY): Payer: 59

## 2019-12-24 LAB — CBC
HCT: 45.1 % (ref 36.0–46.0)
Hemoglobin: 14.7 g/dL (ref 12.0–15.0)
MCH: 29.5 pg (ref 26.0–34.0)
MCHC: 32.6 g/dL (ref 30.0–36.0)
MCV: 90.4 fL (ref 80.0–100.0)
Platelets: 308 10*3/uL (ref 150–400)
RBC: 4.99 MIL/uL (ref 3.87–5.11)
RDW: 13 % (ref 11.5–15.5)
WBC: 14.1 10*3/uL — ABNORMAL HIGH (ref 4.0–10.5)
nRBC: 0 % (ref 0.0–0.2)

## 2019-12-24 LAB — GLUCOSE, CAPILLARY
Glucose-Capillary: 127 mg/dL — ABNORMAL HIGH (ref 70–99)
Glucose-Capillary: 96 mg/dL (ref 70–99)
Glucose-Capillary: 177 mg/dL — ABNORMAL HIGH (ref 70–99)
Glucose-Capillary: 363 mg/dL — ABNORMAL HIGH (ref 70–99)
Glucose-Capillary: 260 mg/dL — ABNORMAL HIGH (ref 70–99)

## 2019-12-24 LAB — COMPREHENSIVE METABOLIC PANEL
ALT: 57 U/L — ABNORMAL HIGH (ref 0–44)
ALT: 55 U/L — ABNORMAL HIGH (ref 0–44)
AST: 27 U/L (ref 15–41)
AST: 25 U/L (ref 15–41)
Albumin: 2.4 g/dL — ABNORMAL LOW (ref 3.5–5.0)
Albumin: 2.4 g/dL — ABNORMAL LOW (ref 3.5–5.0)
Alkaline Phosphatase: 69 U/L (ref 38–126)
Alkaline Phosphatase: 68 U/L (ref 38–126)
Anion gap: 11 (ref 5–15)
Anion gap: 8 (ref 5–15)
BUN: 28 mg/dL — ABNORMAL HIGH (ref 6–20)
BUN: 24 mg/dL — ABNORMAL HIGH (ref 6–20)
CO2: 26 mmol/L (ref 22–32)
CO2: 29 mmol/L (ref 22–32)
Calcium: 8.1 mg/dL — ABNORMAL LOW (ref 8.9–10.3)
Calcium: 8.2 mg/dL — ABNORMAL LOW (ref 8.9–10.3)
Chloride: 100 mmol/L (ref 98–111)
Chloride: 104 mmol/L (ref 98–111)
Creatinine, Ser: 0.81 mg/dL (ref 0.44–1.00)
Creatinine, Ser: 0.62 mg/dL (ref 0.44–1.00)
GFR calc Af Amer: >60 mL/min (ref >60)
GFR calc Af Amer: >60 mL/min (ref >60)
GFR calc non Af Amer: >60 mL/min (ref >60)
GFR calc non Af Amer: >60 mL/min (ref >60)
Glucose, Bld: 212 mg/dL — ABNORMAL HIGH (ref 70–99)
Glucose, Bld: 111 mg/dL — ABNORMAL HIGH (ref 70–99)
Potassium: 4.1 mmol/L (ref 3.5–5.1)
Potassium: 3.7 mmol/L (ref 3.5–5.1)
Sodium: 137 mmol/L (ref 135–145)
Sodium: 141 mmol/L (ref 135–145)
Total Bilirubin: 0.6 mg/dL (ref 0.3–1.2)
Total Bilirubin: 0.5 mg/dL (ref 0.3–1.2)
Total Protein: 5.5 g/dL — ABNORMAL LOW (ref 6.5–8.1)
Total Protein: 5.7 g/dL — ABNORMAL LOW (ref 6.5–8.1)

## 2019-12-24 LAB — C-REACTIVE PROTEIN
CRP: 11.7 mg/dL — ABNORMAL HIGH (ref <1.0)
CRP: 11.7 mg/dL — ABNORMAL HIGH (ref <1.0)

## 2019-12-24 MED ORDER — FUROSEMIDE 10 MG/ML IJ SOLN
40.0000 mg | Freq: Once | INTRAMUSCULAR | Status: AC
  Administered 2019-12-24: 40 mg via INTRAVENOUS
  Filled 2019-12-24: qty 4

## 2019-12-24 NOTE — Progress Notes (Signed)
Primary Cardiologist:  Branch   Subjective:  Doing well BS are high   Objective:  Vitals:   12/23/19 2100 12/24/19 0009 12/24/19 0527 12/24/19 0752  BP:  130/73 140/72 130/68  Pulse: (!) 57 (!) 59 84 73  Resp:  (!) 22 20 18   Temp:  97.7 F (36.5 C) 97.8 F (36.6 C) 97.8 F (36.6 C)  TempSrc:   Oral Oral  SpO2: 94% 94% 92% 90%  Weight:   112.6 kg   Height:        Intake/Output from previous day:  Intake/Output Summary (Last 24 hours) at 12/24/2019 12/26/2019 Last data filed at 12/24/2019 12/26/2019 Gross per 24 hour  Intake 300 ml  Output --  Net 300 ml    Physical Exam: Affect appropriate Overweight white female sitting in chair  HEENT: normal Neck supple with no adenopathy JVP normal no bruits no thyromegaly Lungs clear with no wheezing and good diaphragmatic motion Heart:  S1/S2 no murmur, no rub, gallop or click PMI normal Abdomen: benighn, BS positve, no tenderness, no AAA no bruit.  No HSM or HJR Distal pulses intact with no bruits No edema Neuro non-focal Skin warm and dry No muscular weakness   Lab Results: Basic Metabolic Panel: Recent Labs    12/23/19 2301 12/24/19 0404  NA 137 141  K 4.1 3.7  CL 100 104  CO2 26 29  GLUCOSE 212* 111*  BUN 28* 24*  CREATININE 0.81 0.62  CALCIUM 8.1* 8.2*   Liver Function Tests: Recent Labs    12/23/19 2301 12/24/19 0404  AST 27 25  ALT 57* 55*  ALKPHOS 69 68  BILITOT 0.6 0.5  PROT 5.5* 5.7*  ALBUMIN 2.4* 2.4*   No results for input(s): LIPASE, AMYLASE in the last 72 hours. CBC: Recent Labs    12/23/19 2301 12/24/19 0404  WBC 13.2* 14.1*  HGB 14.6 14.7  HCT 43.9 45.1  MCV 90.7 90.4  PLT 323 308   Cardiac Enzymes: No results for input(s): CKTOTAL, CKMB, CKMBINDEX, TROPONINI in the last 72 hours. BNP: Invalid input(s): POCBNP D-Dimer: No results for input(s): DDIMER in the last 72 hours.  Anemia Panel: No results for input(s): VITAMINB12, FOLATE, FERRITIN, TIBC, IRON, RETICCTPCT in the last 72  hours.  Imaging: DG CHEST PORT 1 VIEW  Result Date: 12/23/2019 CLINICAL DATA:  PICC line placement EXAM: PORTABLE CHEST 1 VIEW COMPARISON:  12/23/2019 FINDINGS: Right upper extremity PICC line has been placed with its tip at the expected superior cavoatrial junction. Lung volumes are small and superimposed asymmetric airspace infiltrates are again identified, more severe within the left lung base, likely infectious or inflammatory in nature. There has, however, developed linear lucencies within the mediastinum and left hilum likely representing at least mild pneumomediastinum. Additionally, lucencies have developed at the lung apices bilaterally either representing tiny bilateral pneumothoraces, or, more likely, gas tracking within the subpleural space at the thoracic inlet given associated pneumomediastinum. Cardiac size within normal limits.  No acute bone abnormality. IMPRESSION: 1. Right upper extremity PICC line with its tip at the expected superior cavoatrial junction. 2. Interval development of pneumomediastinum. 3. Lucencies at the lung apices bilaterally either representing tiny bilateral pneumothoraces, or, more likely,, gas tracking within the subpleural space at the thoracic inlet. This could be confirmed with CT imaging or reassessed with short-term follow-up radiographs. Electronically Signed   By: 12/25/2019 MD   On: 12/23/2019 18:39   DG Chest Port 1 View  Result Date: 12/23/2019 CLINICAL DATA:  Shortness  of breath EXAM: PORTABLE CHEST 1 VIEW COMPARISON:  December 14, 2019 FINDINGS: There is airspace opacity throughout both mid and lower lung regions, similar to prior study. No consolidation. Heart is borderline enlarged with pulmonary vascularity normal. No adenopathy. No bone lesions. IMPRESSION: Airspace opacity throughout both mid and lower lung zones, similar to prior study, likely due to atypical organism pneumonia. No consolidation. Stable cardiac silhouette. No evident  adenopathy. Electronically Signed   By: Bretta Bang III M.D.   On: 12/23/2019 08:21   [place] EKG Site Rite  Result Date: 12/23/2019 If Site Rite image not attached, placement could not be confirmed due to current cardiac rhythm.   Cardiac Studies:  ECG: afib   Telemetry:  NSR rates 80's   Echo: EF 60-65%   Medications:   . (feeding supplement) PROSource Plus  30 mL Oral BID BM  . apixaban  5 mg Oral BID  . baricitinib  4 mg Oral Daily  . Chlorhexidine Gluconate Cloth  6 each Topical Daily  . diltiazem  120 mg Oral Daily  . feeding supplement (GLUCERNA SHAKE)  237 mL Oral TID BM  . fluticasone  2 spray Each Nare Daily  . insulin aspart  0-20 Units Subcutaneous TID WC  . insulin aspart  0-5 Units Subcutaneous QHS  . insulin aspart  6 Units Subcutaneous TID WC  . insulin detemir  15 Units Subcutaneous BID  . Ipratropium-Albuterol  1 puff Inhalation Q6H  . linagliptin  5 mg Oral Daily  . loratadine  10 mg Oral Daily  . mouth rinse  15 mL Mouth Rinse BID  . metoprolol tartrate  50 mg Oral BID  . predniSONE  40 mg Oral Q breakfast  . sodium chloride flush  10-40 mL Intracatheter Q12H  . thyroid  30 mg Oral QAC breakfast  . Vitamin D (Ergocalciferol)  50,000 Units Oral Q7 days     . sodium chloride 250 mL (12/12/19 0851)    Assessment/Plan:   1. Afib:  Converted to NSR continue cardizem,lopressor and eliquis Digoxin d/c yesterday Will arrange outpatient f/u with Dr Wyline Mood  2. COVID:  Improving continue steroids and baricitrib f/u CXR per primary team  Cardiology will sign off   Charlton Haws 12/24/2019, 8:28 AM

## 2019-12-24 NOTE — Progress Notes (Addendum)
PROGRESS NOTE                                                                                                                                                                                                             Patient Demographics:    Linda Richmond, is a 54 y.o. female, DOB - Mar 05, 1966, MNO:[id number]  Outpatient Primary MD for the patient is Ignatius Specking, MD   Admit date - 12/13/19   LOS - 14  Chief Complaint  Patient presents with  . Shortness of Breath       Brief Narrative: Patient is a 54 y.o. female with PMHx of anxiety, depression, DM-2, HTN, seronegative RA-who tested positive for COVID-19 on 8/27-admitted to [facility] on 9/3 with acute hypoxemic respiratory failure secondary to COVID-19 pneumonia.  Unfortunately hospital course complicated by worsening hypoxemia requiring HFNC.  Transferred to [facility] for further evaluation and treatment.  See below for further details  COVID-19 vaccinated status: Unvaccinated  Significant Events: 8/27>> COVID-19 positive 9/3>> Admit to [facility] for hypoxemia secondary to COVID-19 9/6>> transferred to [facility]-worsening hypoxemia on 15 L of HFNC 9/8>> A. fib with RVR  Significant studies: 9/3>>Chest x-ray: New bilateral mid to lower lung patchy opacities 9/9>> Echo: EF 60-65% 9/16>> chest x-ray: Airspace opacity throughout mid/lower lungs  COVID-19 medications: Steroids: 9/3>> Remdesivir: 9/3>> 9/7 Baricitinib: 9/5  Antibiotics: None  Microbiology data: 9/20 >>blood culture: 1/2-Staph epidermidis (likely contaminant)  Procedures: 9/16>> PICC line placement  Consults: Cardiology  DVT prophylaxis: SCDs Start: Dec 13, 2019 1527 Eliquis    Subjective:   No major issues overnight-remains on 15 L HFNC along with NRB.   Assessment  & Plan :   Acute Hypoxic Resp Failure due to Covid 19 Viral pneumonia: Continues to have severe  hypoxemia-remains on 15 L of HFNC and NRB.  Continue steroids and baricitinib.  Has some lower extremity edema today-we will go and give IV Lasix x1 today.  Watch CRP as it is trending back up-however patient's clinical scenario remains unchanged.   Continue to attempt to slowly titrate down FiO2.    Fever: afebrile O2 requirements:  SpO2: 92 % O2 Flow Rate (L/min): 15 L/min FiO2 (%): 94 %   COVID-19 Labs: Recent Labs    12/22/19 1321 12/23/19 2301 12/24/19 0404  CRP 1.8* 11.7* 11.7*  Component Value Date/Time   BNP 20.9 12/15/2019 0555    No results for input(s): PROCALCITON in the last 168 hours.  Lab Results  Component Value Date   SARSCOV2NAA POSITIVE (A) 12/11/2019   SARSCOV2NAA Not Detected 04/13/2019   SARSCOV2NAA Not Detected 02/02/2019   SARSCOV2NAA Not Detected 01/22/2019      Prone/Incentive Spirometry: encouraged patient to lie prone for 3-4 hours at a time for a total of 16 hours a day, and to encourage incentive spirometry use 3-4/hour.  Pneumomediastinum: Likely sequelae of severe parenchymal injury.  Seen on x-ray chest yesterday evening after PICC line placement-interestingly-not seen on x-ray of the chest yesterday morning.  Repeat chest x-ray today.  Addendum-repeat chest x-ray reviewed-has a very tiny small pneumothorax in the left lung apex-we will repeat chest x-ray tomorrow morning to make sure it is not expanding.  Case discussed with PCCM-Dr. Tonia Brooms.  Epistaxis: No active epistaxis over the past few days-still has some dried blood in her right nasal cavity.  Continue supportive care.  A. fib with RVR: Telemetry reviewed-maintaining sinus rhythm.  Continue Cardizem and Lopressor. Cardiology has discontinued digoxin.  Remains on Eliquis.  DM-2 (A1c 10.8) with steroid induced hyperglycemia: CBGs remained stable-continue Levemir 15 units twice daily, 6 units of NovoLog with meals and SSI-as steroid gets tapered down-May require less insulin over the  next few days.  Continue to monitor.   Recent Labs    12/24/19 0253 12/24/19 0751 12/24/19 1157  GLUCAP 127* 96 177*   HTN: BP controlled-continue metoprolol and Cardizem.Marland Kitchen  Hypothyroidism: Continue thyroid Armour-TSH within normal limits  GERD: Continue PPI  Positive blood culture-1/2+ for staph epidermidis-likely represents a contaminant-no treatment required.  Nutrition Problem: Nutrition Problem: Increased nutrient needs Etiology: acute illness (COVID-19) Signs/Symptoms: estimated needs Interventions: Glucerna shake, MVI, Premier Protein  Morbid Obesity: Estimated body mass index is 45.4 kg/m as calculated from the following:   Height as of this encounter:  (1.575 m).   Weight as of this encounter: 112.6 kg.    ABG: No results found for: PHART, PCO2ART, PO2ART, HCO3, TCO2, ACIDBASEDEF, O2SAT  Vent Settings:     Condition - Extremely Guarded  Family Communication  :  Daughter Marchelle Folks [phone]) phone on 9/17-she is okay with me calling her on 9/19 unless things changes.  Code Status :  Full Code  Diet :  Diet Order            Diet Carb Modified Fluid consistency: Thin; Room service appropriate? Yes  Diet effective now                  Disposition Plan  :   Status is: Inpatient  Remains inpatient appropriate because:Inpatient level of care appropriate due to severity of illness  Dispo: The patient is from: Home              Anticipated d/c is to: Home              Anticipated d/c date is: > 3 days              Patient currently is not medically stable to d/c.   Barriers to discharge: Hypoxia requiring O2 supplementation  Antimicorbials  :    Anti-infectives (From admission, onward)   Start     Dose/Rate Route Frequency Ordered Stop   12/11/19 1000  remdesivir 100 mg in sodium chloride 0.9 % 100 mL IVPB  Status:  Discontinued       "Followed by" Linked  Group Details   100 mg 200 mL/hr over 30 Minutes Intravenous Daily 01/07/2020 1529  01/07/2020 1532   12/11/19 1000  remdesivir 100 mg in sodium chloride 0.9 % 100 mL IVPB        100 mg 200 mL/hr over 30 Minutes Intravenous Daily 01/07/20 1533 12/14/19 0907   January 07, 2020 1545  remdesivir 100 mg in sodium chloride 0.9 % 100 mL IVPB        100 mg 200 mL/hr over 30 Minutes Intravenous Every 30 min 01/07/20 1533 01/07/2020 1850   07-Jan-2020 1530  remdesivir 200 mg in sodium chloride 0.9% 250 mL IVPB  Status:  Discontinued       "Followed by" Linked Group Details   200 mg 580 mL/hr over 30 Minutes Intravenous Once 01-07-2020 1529 01/07/2020 1532      Inpatient Medications  Scheduled Meds: . (feeding supplement) PROSource Plus  30 mL Oral BID BM  . apixaban  5 mg Oral BID  . baricitinib  4 mg Oral Daily  . Chlorhexidine Gluconate Cloth  6 each Topical Daily  . diltiazem  120 mg Oral Daily  . feeding supplement (GLUCERNA SHAKE)  237 mL Oral TID BM  . fluticasone  2 spray Each Nare Daily  . insulin aspart  0-20 Units Subcutaneous TID WC  . insulin aspart  0-5 Units Subcutaneous QHS  . insulin aspart  6 Units Subcutaneous TID WC  . insulin detemir  15 Units Subcutaneous BID  . Ipratropium-Albuterol  1 puff Inhalation Q6H  . linagliptin  5 mg Oral Daily  . loratadine  10 mg Oral Daily  . mouth rinse  15 mL Mouth Rinse BID  . metoprolol tartrate  50 mg Oral BID  . predniSONE  40 mg Oral Q breakfast  . sodium chloride flush  10-40 mL Intracatheter Q12H  . thyroid  30 mg Oral QAC breakfast  . Vitamin D (Ergocalciferol)  50,000 Units Oral Q7 days   Continuous Infusions: . sodium chloride 250 mL (12/12/19 0851)   PRN Meds:.sodium chloride, acetaminophen, ALPRAZolam, chlorpheniramine-HYDROcodone, guaiFENesin-dextromethorphan, lip balm, metoprolol tartrate, oxymetazoline, pantoprazole, sodium chloride, sodium chloride flush   See all Orders from today for further details   Jeoffrey Massed M.D on 12/24/2019 at 3:20 PM  To page go to [email or web address] - use universal password  [facility] -  Office  [phone]    Objective:   Vitals:   12/24/19 0527 12/24/19 0752 12/24/19 1201 12/24/19 1400  BP: 140/72 130/68 127/72   Pulse: 84 73 72 78  Resp: 20 18 18    Temp: 97.8 F (36.6 C) 97.8 F (36.6 C) 98.1 F (36.7 C)   TempSrc: Oral Oral Oral   SpO2: 92% 90% 93% 92%  Weight: 112.6 kg     Height:        Wt Readings from Last 3 Encounters:  12/24/19 112.6 kg  10/25/16 121.1 kg  08/23/16 126.6 kg     Intake/Output Summary (Last 24 hours) at 12/24/2019 1520 Last data filed at 12/24/2019 0940 Gross per 24 hour  Intake 300 ml  Output --  Net 300 ml     Physical Exam Gen Exam:Alert awake-not in any distress HEENT:atraumatic, normocephalic Chest: B/L clear to auscultation anteriorly CVS:S1S2 regular Abdomen:soft non tender, non distended Extremities: Trace edema Neurology: Non focal Skin: no rash   Data Review:    CBC Recent Labs  Lab 12/20/19 0414 12/21/19 0637 12/22/19 1321 12/23/19 2301 12/24/19 0404  WBC 10.5 10.9* 12.7* 13.2* 14.1*  HGB 17.5*  15.6* 15.2* 14.6 14.7  HCT 52.2* 47.9* 46.0 43.9 45.1  PLT 410* 354 344 323 308  MCV 90.0 91.1 88.6 90.7 90.4  MCH 30.2 29.7 29.3 30.2 29.5  MCHC 33.5 32.6 33.0 33.3 32.6  RDW 12.9 12.9 12.6 12.9 13.0    Chemistries  Recent Labs  Lab 12/20/19 0414 12/21/19 0637 12/22/19 1321 12/23/19 2301 12/24/19 0404  NA 142 135 136 137 141  K 3.3* 4.0 4.1 4.1 3.7  CL 105 101 98 100 104  CO2 25 21* 25 26 29   GLUCOSE 90 244* 228* 212* 111*  BUN 20 24* 30* 28* 24*  CREATININE 0.81 0.77 0.84 0.81 0.62  CALCIUM 8.2* 7.9* 8.1* 8.1* 8.2*  AST 51* 44* 38 27 25  ALT 71* 64* 65* 57* 55*  ALKPHOS 59 59 62 69 68  BILITOT 0.9 0.8 0.9 0.6 0.5   ------------------------------------------------------------------------------------------------------------------ No results for input(s): CHOL, HDL, LDLCALC, TRIG, CHOLHDL, LDLDIRECT in the last 72 hours.  Lab Results  Component Value Date   HGBA1C 10.8  (H) 12/11/2019   ------------------------------------------------------------------------------------------------------------------ No results for input(s): TSH, T4TOTAL, T3FREE, THYROIDAB in the last 72 hours.  Invalid input(s): FREET3 ------------------------------------------------------------------------------------------------------------------ No results for input(s): VITAMINB12, FOLATE, FERRITIN, TIBC, IRON, RETICCTPCT in the last 72 hours.  Coagulation profile No results for input(s): INR, PROTIME in the last 168 hours.  No results for input(s): DDIMER in the last 72 hours.  Cardiac Enzymes No results for input(s): CKMB, TROPONINI, MYOGLOBIN in the last 168 hours.  Invalid input(s): CK ------------------------------------------------------------------------------------------------------------------    Component Value Date/Time   BNP 20.9 12/15/2019 0555    Micro Results Recent Results (from the past 240 hour(s))  MRSA PCR Screening     Status: None   Collection Time: 12/20/19 11:22 PM   Specimen: Nasal Mucosa; Nasopharyngeal  Result Value Ref Range Status   MRSA by PCR NEGATIVE NEGATIVE Final    Comment:        The GeneXpert MRSA Assay (FDA approved for NASAL specimens only), is one component of a comprehensive MRSA colonization surveillance program. It is not intended to diagnose MRSA infection nor to guide or monitor treatment for MRSA infections. Performed at [facility], [address], [place], [place]     Radiology Reports DG CHEST PORT 1 VIEW  Result Date: 12/23/2019 CLINICAL DATA:  PICC line placement EXAM: PORTABLE CHEST 1 VIEW COMPARISON:  12/23/2019 FINDINGS: Right upper extremity PICC line has been placed with its tip at the expected superior cavoatrial junction. Lung volumes are small and superimposed asymmetric airspace infiltrates are again identified, more severe within the left lung base, likely infectious or inflammatory in  nature. There has, however, developed linear lucencies within the mediastinum and left hilum likely representing at least mild pneumomediastinum. Additionally, lucencies have developed at the lung apices bilaterally either representing tiny bilateral pneumothoraces, or, more likely, gas tracking within the subpleural space at the thoracic inlet given associated pneumomediastinum. Cardiac size within normal limits.  No acute bone abnormality. IMPRESSION: 1. Right upper extremity PICC line with its tip at the expected superior cavoatrial junction. 2. Interval development of pneumomediastinum. 3. Lucencies at the lung apices bilaterally either representing tiny bilateral pneumothoraces, or, more likely,, gas tracking within the subpleural space at the thoracic inlet. This could be confirmed with CT imaging or reassessed with short-term follow-up radiographs. Electronically Signed   By: 12/25/2019 MD   On: 12/23/2019 18:39   DG Chest Port 1 View  Result Date: 12/23/2019 CLINICAL  DATA:  Shortness of breath EXAM: PORTABLE CHEST 1 VIEW COMPARISON:  December 14, 2019 FINDINGS: There is airspace opacity throughout both mid and lower lung regions, similar to prior study. No consolidation. Heart is borderline enlarged with pulmonary vascularity normal. No adenopathy. No bone lesions. IMPRESSION: Airspace opacity throughout both mid and lower lung zones, similar to prior study, likely due to atypical organism pneumonia. No consolidation. Stable cardiac silhouette. No evident adenopathy. Electronically Signed   By: Bretta Bang III M.D.   On: 12/23/2019 08:21   DG Chest Port 1 View  Result Date: 12/14/2019 CLINICAL DATA:  Shortness of breath, COVID. EXAM: PORTABLE CHEST 1 VIEW COMPARISON:  Prior chest radiographs 01/07/2020 and earlier. FINDINGS: Shallow inspiration radiograph. The cardiomediastinal silhouette is unchanged. Similar to the prior examination of 01/07/20, there are interstitial and ill-defined  airspace opacities within the left greater than right mid to lower lung fields. No definite pleural effusion. No evidence of pneumothorax. No acute bony abnormality identified. IMPRESSION: Shallow inspiration radiograph. Unchanged appearance of interstitial and ill-defined airspace opacities within the left greater than right mid-to-lower lung fields. These findings likely reflect multifocal pneumonia given the provided history of COVID positivity. Electronically Signed   By: Jackey Loge DO   On: 12/14/2019 08:20   DG Chest Port 1 View  Result Date: Jan 07, 2020 CLINICAL DATA:  COVID positive EXAM: PORTABLE CHEST 1 VIEW COMPARISON:  04/19/2019 chest radiograph and prior. FINDINGS: Hypoinflated lungs. Bilateral mid to lower lung patchy opacities, new since prior exam. No pneumothorax. Trace bilateral effusions. Cardiomediastinal silhouette within normal limits. IMPRESSION: New bilateral mid to lower lung patchy opacities. Hypoinflated lungs. Electronically Signed   By: Stana Bunting M.D.   On: January 07, 2020 13:57   ECHOCARDIOGRAM LIMITED  Result Date: 12/16/2019    ECHOCARDIOGRAM LIMITED REPORT   Patient Name:   JUELLE DICKMANN Date of Exam: 12/16/2019 Medical Rec #:  [id number]     Height:       62.0 in Accession #:    [id number]    Weight:       262.0 lb Date of Birth:  1965-06-04      BSA:          2.144 m Patient Age:    54 years      BP:           109/73 mmHg Patient Gender: F             HR:           131 bpm. Exam Location:  Inpatient Procedure: Limited Echo, Limited Color Doppler, Cardiac Doppler and Intracardiac            Opacification Agent Indications:    Acute Hypoxic Respiratory Failure  History:        Patient has prior history of Echocardiogram examinations, most                 recent 09/06/2016. Risk Factors:Hypertension and Diabetes. GERD.  Sonographer:    [name] RDCS Referring Phys: [id number] Deno Lunger [facility] IMPRESSIONS  1. Left ventricular ejection fraction, by estimation, is 60 to 65%.  The left ventricle has normal function. The left ventricle has no regional wall motion abnormalities. There is mild left ventricular hypertrophy. Left ventricular diastolic function could not be evaluated.  2. The mitral valve is grossly normal.  3. The aortic valve is tricuspid. Aortic valve regurgitation is not visualized. No aortic stenosis is present. Comparison(s): Prior images unable to be directly viewed, comparison made by  report only. Conclusion(s)/Recommendation(s): Otherwise normal echocardiogram, with minor abnormalities described in the report. FINDINGS  Left Ventricle: Left ventricular ejection fraction, by estimation, is 60 to 65%. The left ventricle has normal function. The left ventricle has no regional wall motion abnormalities. Definity contrast agent was given IV to delineate the left ventricular  endocardial borders. There is mild left ventricular hypertrophy. Left ventricular diastolic function could not be evaluated. Left ventricular diastolic function could not be evaluated due to atrial fibrillation. Left Atrium: Left atrial size was normal in size. Right Atrium: Right atrial size was not well visualized. Pericardium: Trivial pericardial effusion is present. Mitral Valve: The mitral valve is grossly normal. Tricuspid Valve: The tricuspid valve is normal in structure. Tricuspid valve regurgitation is trivial. No evidence of tricuspid stenosis. Aortic Valve: The aortic valve is tricuspid. Aortic valve regurgitation is not visualized. No aortic stenosis is present. Pulmonic Valve: The pulmonic valve was not well visualized. Pulmonic valve regurgitation is not visualized. Aorta: The aortic root is normal in size and structure. Venous: The inferior vena cava was not well visualized. IAS/Shunts: The atrial septum is grossly normal. LEFT VENTRICLE PLAX 2D LVIDd:         4.50 cm LVIDs:         2.80 cm LV PW:         1.10 cm LV IVS:        1.20 cm LVOT diam:     1.80 cm LV SV:         28 LV SV Index:    13 LVOT Area:     2.54 cm  LV Volumes (MOD) LV vol d, MOD A2C: 113.0 ml LV vol d, MOD A4C: 118.0 ml LV vol s, MOD A2C: 53.6 ml LV vol s, MOD A4C: 41.4 ml LV SV MOD A2C:     59.4 ml LV SV MOD A4C:     118.0 ml LV SV MOD BP:      71.7 ml LEFT ATRIUM             Index LA diam:        3.90 cm 1.82 cm/m LA Vol (A2C):   34.2 ml 15.95 ml/m LA Vol (A4C):   23.4 ml 10.91 ml/m LA Biplane Vol: 29.0 ml 13.53 ml/m  AORTIC VALVE LVOT Vmax:   83.60 cm/s LVOT Vmean:  49.400 cm/s LVOT VTI:    0.110 m  AORTA Ao Root diam: 2.90 cm MITRAL VALVE MV Area (PHT): 4.09 cm    SHUNTS MV Decel Time: 186 msec    Systemic VTI:  0.11 m MV E velocity: 72.40 cm/s  Systemic Diam: 1.80 cm Jodelle Red MD Electronically signed by Jodelle Red MD Signature Date/Time: 12/16/2019/9:51:17 PM    Final    [place] EKG Site Rite  Result Date: 12/23/2019 If Site Rite image not attached, placement could not be confirmed due to current cardiac rhythm.

## 2019-12-24 NOTE — Progress Notes (Signed)
Occupational Therapy Treatment Patient Details Name: Linda Richmond MRN: [id number] DOB: January 13, 1966 Today's Date: 12/24/2019    History of present illness Pt is a 54 y.o. female who tested (+) COVID-19 on 12/03/19, now admitted to [facility] 12/15/2019 with acute hypoxic respiratory failure due to covid PNA. Transferred to [facility] on 12/13/19 due to worsening hypoxemia. Afib with RVR 9/8. PMH includes DM, RA, morbid obesity.   OT comments  Pt seen for OT follow up session with focus on ADL mobility progression and ECS training. Pt able to recall instructions to use theraband. This date pt was on 6L HFNC with supplemental 15 L NRB nearby. Pt completed BSC transfer with min guard assist. Noted SpO2 down to 78-80%. Additional NRB applied with sats reading 88-89- and ultimately improved with rest. RN aware of this change. D/c recs remain appropriate, will continue to follow.     Follow Up Recommendations  No OT follow up;Supervision - Intermittent    Equipment Recommendations  Tub/shower seat    Recommendations for Other Services      Precautions / Restrictions Precautions Precautions: Other (comment) Precaution Comments: watch SpO2 (only using NRB -- no Berino due to nose bleeds) Restrictions Weight Bearing Restrictions: No       Mobility Bed Mobility               General bed mobility comments: up in chair, returned to chair  Transfers Overall transfer level: Modified independent                    Balance Overall balance assessment: No apparent balance deficits (not formally assessed)                                         ADL either performed or assessed with clinical judgement   ADL Overall ADL's : Needs assistance/impaired                         Toilet Transfer: Min Barrister's clerk Details (indicate cue type and reason): pt completed transfer to [facility] to urinate Toileting- Clothing Manipulation and Hygiene: Set  up;Sitting/lateral lean Toileting - Clothing Manipulation Details (indicate cue type and reason): for anterior peri care     Functional mobility during ADLs: Min guard General ADL Comments: Pt with higher O2 demands this date and difficulty breathing/ maintaining activity level     Vision Patient Visual Report: No change from baseline Vision Assessment?: No apparent visual deficits   Perception     Praxis      Cognition Arousal/Alertness: Awake/alert Behavior During Therapy: WFL for tasks assessed/performed;Anxious Overall Cognitive Status: Within Functional Limits for tasks assessed                                 General Comments: continues to show signs of anxiety        Exercises     Shoulder Instructions       General Comments      Pertinent Vitals/ Pain       Pain Assessment: No/denies pain  Home Living  Prior Functioning/Environment              Frequency  Min 2X/week        Progress Toward Goals  OT Goals(current goals can now be found in the care plan section)  Progress towards OT goals: Progressing toward goals  Acute Rehab OT Goals Patient Stated Goal: return home OT Goal Formulation: With patient Time For Goal Achievement: 12/28/19 Potential to Achieve Goals: Good  Plan Discharge plan remains appropriate    Co-evaluation                 AM-PAC OT "6 Clicks" Daily Activity     Outcome Measure   Help from another person eating meals?: A Little Help from another person taking care of personal grooming?: A Little Help from another person toileting, which includes using toliet, bedpan, or urinal?: A Little Help from another person bathing (including washing, rinsing, drying)?: A Little Help from another person to put on and taking off regular upper body clothing?: A Little Help from another person to put on and taking off regular lower body clothing?: A  Little 6 Click Score: 18    End of Session Equipment Utilized During Treatment: Oxygen  OT Visit Diagnosis: Other (comment) (decreased cardiopulmonary tolerance)   Activity Tolerance Patient tolerated treatment well   Patient Left with call bell/phone within reach;in chair   Nurse Communication Mobility status        Time: 1129-1150 OT Time Calculation (min): 21 min  Charges: OT General Charges $OT Visit: 1 Visit OT Treatments $Self Care/Home Management : 8-22 mins  Dalphine Handing, MSOT, OTR/L Acute Rehabilitation Services [facility] Office Number: [phone] Pager: [phone]  Dalphine Handing 12/24/2019, 2:20 PM

## 2019-12-25 ENCOUNTER — Inpatient Hospital Stay (HOSPITAL_COMMUNITY): Payer: 59

## 2019-12-25 LAB — CBC
HCT: 45.9 % (ref 36.0–46.0)
Hemoglobin: 15 g/dL (ref 12.0–15.0)
MCH: 30.2 pg (ref 26.0–34.0)
MCHC: 32.7 g/dL (ref 30.0–36.0)
MCV: 92.4 fL (ref 80.0–100.0)
Platelets: 324 10*3/uL (ref 150–400)
RBC: 4.97 MIL/uL (ref 3.87–5.11)
RDW: 13 % (ref 11.5–15.5)
WBC: 13.2 10*3/uL — ABNORMAL HIGH (ref 4.0–10.5)
nRBC: 0 % (ref 0.0–0.2)

## 2019-12-25 LAB — GLUCOSE, CAPILLARY
Glucose-Capillary: 104 mg/dL — ABNORMAL HIGH (ref 70–99)
Glucose-Capillary: 62 mg/dL — ABNORMAL LOW (ref 70–99)
Glucose-Capillary: 165 mg/dL — ABNORMAL HIGH (ref 70–99)
Glucose-Capillary: 217 mg/dL — ABNORMAL HIGH (ref 70–99)
Glucose-Capillary: 394 mg/dL — ABNORMAL HIGH (ref 70–99)
Glucose-Capillary: 420 mg/dL — ABNORMAL HIGH (ref 70–99)
Glucose-Capillary: 391 mg/dL — ABNORMAL HIGH (ref 70–99)
Glucose-Capillary: 348 mg/dL — ABNORMAL HIGH (ref 70–99)

## 2019-12-25 LAB — PROCALCITONIN: Procalcitonin: <0.1 ng/mL

## 2019-12-25 LAB — COMPREHENSIVE METABOLIC PANEL
ALT: 54 U/L — ABNORMAL HIGH (ref 0–44)
AST: 28 U/L (ref 15–41)
Albumin: 2.4 g/dL — ABNORMAL LOW (ref 3.5–5.0)
Alkaline Phosphatase: 72 U/L (ref 38–126)
Anion gap: 9 (ref 5–15)
BUN: 27 mg/dL — ABNORMAL HIGH (ref 6–20)
CO2: 28 mmol/L (ref 22–32)
Calcium: 8.3 mg/dL — ABNORMAL LOW (ref 8.9–10.3)
Chloride: 101 mmol/L (ref 98–111)
Creatinine, Ser: 0.78 mg/dL (ref 0.44–1.00)
GFR calc Af Amer: >60 mL/min (ref >60)
GFR calc non Af Amer: >60 mL/min (ref >60)
Glucose, Bld: 134 mg/dL — ABNORMAL HIGH (ref 70–99)
Potassium: 3.9 mmol/L (ref 3.5–5.1)
Sodium: 138 mmol/L (ref 135–145)
Total Bilirubin: 0.5 mg/dL (ref 0.3–1.2)
Total Protein: 6 g/dL — ABNORMAL LOW (ref 6.5–8.1)

## 2019-12-25 LAB — C-REACTIVE PROTEIN: CRP: 12.3 mg/dL — ABNORMAL HIGH (ref <1.0)

## 2019-12-25 MED ORDER — INSULIN GLARGINE 100 UNIT/ML ~~LOC~~ SOLN: 12.0000 [IU] | Freq: Every day | SUBCUTANEOUS | Status: DC

## 2019-12-25 MED ORDER — INSULIN DETEMIR 100 UNIT/ML ~~LOC~~ SOLN: 15.0000 [IU] | Freq: Every morning | SUBCUTANEOUS | Status: DC

## 2019-12-25 MED ORDER — INSULIN DETEMIR 100 UNIT/ML ~~LOC~~ SOLN
10.0000 [IU] | Freq: Every day | SUBCUTANEOUS | Status: DC
  Administered 2019-12-25: 10 [IU] via SUBCUTANEOUS
  Filled 2019-12-25 (×3): qty 0.1

## 2019-12-25 MED ORDER — INSULIN GLARGINE 100 UNIT/ML ~~LOC~~ SOLN: 15.0000 [IU] | Freq: Every day | SUBCUTANEOUS | Status: DC

## 2019-12-25 MED ORDER — FUROSEMIDE 10 MG/ML IJ SOLN
40.0000 mg | Freq: Once | INTRAMUSCULAR | Status: AC
  Administered 2019-12-25: 40 mg via INTRAVENOUS
  Filled 2019-12-25: qty 4

## 2019-12-25 MED ORDER — INSULIN DETEMIR 100 UNIT/ML ~~LOC~~ SOLN
20.0000 [IU] | Freq: Every morning | SUBCUTANEOUS | Status: DC
  Filled 2019-12-25: qty 0.2

## 2019-12-25 MED ORDER — INSULIN ASPART 100 UNIT/ML ~~LOC~~ SOLN: 10.0000 [IU] | Freq: Three times a day (TID) | SUBCUTANEOUS | Status: DC

## 2019-12-25 NOTE — Progress Notes (Addendum)
PROGRESS NOTE                                                                                                                                                                                                             Patient Demographics:    Linda Richmond, is a 54 y.o. female, DOB - 02-11-1966, WRU:[id number]  Outpatient Primary MD for the patient is Ignatius Specking, MD   Admit date - 12/08/2019   LOS - 15  Chief Complaint  Patient presents with  . Shortness of Breath       Brief Narrative: Patient is a 54 y.o. female with PMHx of anxiety, depression, DM-2, HTN, seronegative RA-who tested positive for COVID-19 on 8/27-admitted to [facility] on 9/3 with acute hypoxemic respiratory failure secondary to COVID-19 pneumonia.  Unfortunately hospital course complicated by worsening hypoxemia requiring HFNC.  Transferred to [facility] for further evaluation and treatment.  See below for further details  COVID-19 vaccinated status: Unvaccinated  Significant Events: 8/27>> COVID-19 positive 9/3>> Admit to [facility] for hypoxemia secondary to COVID-19 9/6>> transferred to [facility]-worsening hypoxemia on 15 L of HFNC 9/8>> A. fib with RVR  Significant studies: 9/3>>Chest x-ray: New bilateral mid to lower lung patchy opacities 9/9>> Echo: EF 60-65% 9/16>> chest x-ray: Airspace opacity throughout mid/lower lungs 9/16>> chest x-ray: Interval development of pneumomediastinum 9/17>> chest x-ray: Pneumomediastinum, tiny left apical pneumothorax-no change in bilateral airspace opacities 9/18>> chest x-ray: Stable extensive patchy hazy opacities-no pneumothorax.  COVID-19 medications: Steroids: 9/3>> Remdesivir: 9/3>> 9/7 Baricitinib: 9/5  Antibiotics: None  Microbiology data: 9/20 >>blood culture: 1/2-Staph epidermidis (likely contaminant)  Procedures: 9/16>> PICC line placement  Consults: Cardiology  DVT  prophylaxis: SCDs Start: 12/18/2019 1527 Eliquis    Subjective:   Unchanged-on 15 L HFNC and NRB.  No epistaxis.  No other complaints.   Assessment  & Plan :   Acute Hypoxic Resp Failure due to Covid 19 Viral pneumonia: Remains stable-continues to have severe hypoxemia-still requiring 15 L of HFNC and NRB.  CRP is uptrending but patient's clinical situation remains unchanged-do not think patient requires any further escalation in steroid dosing.  Continue to slowly taper down steroids-remains on baricitinib.  Repeat IV Lasix x1-in an attempt to keep patient in negative balance.  Continue to attempt to slowly titrate down FiO2.    Fever: afebrile O2  requirements:  SpO2: (!) 89 % O2 Flow Rate (L/min): 15 L/min FiO2 (%): 94 %   COVID-19 Labs: Recent Labs    12/23/19 2301 12/24/19 0404 12/25/19 0106  CRP 11.7* 11.7* 12.3*       Component Value Date/Time   BNP 20.9 12/15/2019 0555    Recent Labs  Lab 12/25/19 0106  PROCALCITON <0.10    Lab Results  Component Value Date   SARSCOV2NAA POSITIVE (A) 12/28/2019   SARSCOV2NAA Not Detected 04/13/2019   SARSCOV2NAA Not Detected 02/02/2019   SARSCOV2NAA Not Detected 01/22/2019      Prone/Incentive Spirometry: encouraged patient to lie prone for 3-4 hours at a time for a total of 16 hours a day, and to encourage incentive spirometry use 3-4/hour.  Pneumomediastinum with small left apical pneumothorax: Likely sequelae of severe parenchymal injury.  Pneumothorax seen on chest x-ray after placement of PICC line on 9/16-however x-ray on 9/18 shows resolution of pneumothorax.  Repeat chest x-ray on 9/19.   Epistaxis: Resolved-continue supportive care.  A. fib with RVR: Maintaining sinus rhythm-continue Cardizem/Lopressor-remains on Eliquis.   DM-2 (A1c 10.8) with steroid induced hyperglycemia: Hypoglycemic early this morning-change p.m. dose of Levemir to 10 units, continue 15 units of Levemir in a.m.-remains on 6 units of NovoLog  with meals and SSI.  Continue to monitor closely.     Recent Labs    12/25/19 0742 12/25/19 0933 12/25/19 1155  GLUCAP 62* 165* 217*   HTN: BP controlled-continue metoprolol and Cardizem.Marland Kitchen  Hypothyroidism: Continue thyroid Armour-TSH within normal limits  GERD: Continue PPI  Positive blood culture-1/2+ for staph epidermidis-likely represents a contaminant-no treatment required.  Nutrition Problem: Nutrition Problem: Increased nutrient needs Etiology: acute illness (COVID-19) Signs/Symptoms: estimated needs Interventions: Glucerna shake, MVI, Premier Protein  Morbid Obesity: Estimated body mass index is 44.52 kg/m as calculated from the following:   Height as of this encounter: 5\' 2"  (1.575 m).   Weight as of this encounter: 110.4 kg.    ABG: No results found for: PHART, PCO2ART, PO2ART, HCO3, TCO2, ACIDBASEDEF, O2SAT  Vent Settings:     Condition - Extremely Guarded  Family Communication  :  Daughter [phone]) phone on 9/17-we will update on 9/19 as patient remained stable and unchanged.  Code Status :  Full Code  Diet :  Diet Order            Diet Carb Modified Fluid consistency: Thin; Room service appropriate? Yes  Diet effective now                  Disposition Plan  :   Status is: Inpatient  Remains inpatient appropriate because:Inpatient level of care appropriate due to severity of illness  Dispo: The patient is from: Home              Anticipated d/c is to: Home              Anticipated d/c date is: > 3 days              Patient currently is not medically stable to d/c.   Barriers to discharge: Hypoxia requiring O2 supplementation  Antimicorbials  :    Anti-infectives (From admission, onward)   Start     Dose/Rate Route Frequency Ordered Stop   12/11/19 1000  remdesivir 100 mg in sodium chloride 0.9 % 100 mL IVPB  Status:  Discontinued       "Followed by" Linked Group Details   100 mg 200 mL/hr over 30  Minutes Intravenous  Daily 12/08/2019 1529 01/06/2020 1532   12/11/19 1000  remdesivir 100 mg in sodium chloride 0.9 % 100 mL IVPB        100 mg 200 mL/hr over 30 Minutes Intravenous Daily 01/02/2020 1533 12/14/19 0907   12/12/2019 1545  remdesivir 100 mg in sodium chloride 0.9 % 100 mL IVPB        100 mg 200 mL/hr over 30 Minutes Intravenous Every 30 min 12/09/2019 1533 01/03/2020 1850   12/31/2019 1530  remdesivir 200 mg in sodium chloride 0.9% 250 mL IVPB  Status:  Discontinued       "Followed by" Linked Group Details   200 mg 580 mL/hr over 30 Minutes Intravenous Once 12/24/2019 1529 12/14/2019 1532      Inpatient Medications  Scheduled Meds: . (feeding supplement) PROSource Plus  30 mL Oral BID BM  . apixaban  5 mg Oral BID  . Chlorhexidine Gluconate Cloth  6 each Topical Daily  . diltiazem  120 mg Oral Daily  . feeding supplement (GLUCERNA SHAKE)  237 mL Oral TID BM  . fluticasone  2 spray Each Nare Daily  . insulin aspart  0-20 Units Subcutaneous TID WC  . insulin aspart  0-5 Units Subcutaneous QHS  . insulin aspart  6 Units Subcutaneous TID WC  . insulin detemir  15 Units Subcutaneous BID  . Ipratropium-Albuterol  1 puff Inhalation Q6H  . linagliptin  5 mg Oral Daily  . loratadine  10 mg Oral Daily  . mouth rinse  15 mL Mouth Rinse BID  . metoprolol tartrate  50 mg Oral BID  . predniSONE  40 mg Oral Q breakfast  . sodium chloride flush  10-40 mL Intracatheter Q12H  . thyroid  30 mg Oral QAC breakfast  . Vitamin D (Ergocalciferol)  50,000 Units Oral Q7 days   Continuous Infusions: . sodium chloride 250 mL (12/12/19 0851)   PRN Meds:.sodium chloride, acetaminophen, ALPRAZolam, chlorpheniramine-HYDROcodone, guaiFENesin-dextromethorphan, lip balm, metoprolol tartrate, oxymetazoline, pantoprazole, sodium chloride, sodium chloride flush   See all Orders from today for further details   Jeoffrey Massed M.D on 12/25/2019 at 1:22 PM  To page go to [email or web address] - use universal password  [facility] -   Office  [phone]    Objective:   Vitals:   12/25/19 0303 12/25/19 0542 12/25/19 0744 12/25/19 1158  BP: 111/70  117/68 117/73  Pulse: 66 62 75 86  Resp: 17  (!) 21 19  Temp: 98.3 F (36.8 C)   98.1 F (36.7 C)  TempSrc: Oral   Axillary  SpO2: 96%  (!) 89% (!) 89%  Weight:  110.4 kg    Height:        Wt Readings from Last 3 Encounters:  12/25/19 110.4 kg  10/25/16 121.1 kg  08/23/16 126.6 kg     Intake/Output Summary (Last 24 hours) at 12/25/2019 1322 Last data filed at 12/25/2019 1100 Gross per 24 hour  Intake 315 ml  Output 750 ml  Net -435 ml     Physical Exam Gen Exam:Alert awake-not in any distress HEENT:atraumatic, normocephalic Chest: B/L clear to auscultation anteriorly CVS:S1S2 regular Abdomen:soft non tender, non distended Extremities:trace edema Neurology: Non focal Skin: no rash   Data Review:    CBC Recent Labs  Lab 12/21/19 0637 12/22/19 1321 12/23/19 2301 12/24/19 0404 12/25/19 0106  WBC 10.9* 12.7* 13.2* 14.1* 13.2*  HGB 15.6* 15.2* 14.6 14.7 15.0  HCT 47.9* 46.0 43.9 45.1 45.9  PLT 354 344 323 308 324  MCV 91.1 88.6 90.7 90.4 92.4  MCH 29.7 29.3 30.2 29.5 30.2  MCHC 32.6 33.0 33.3 32.6 32.7  RDW 12.9 12.6 12.9 13.0 13.0    Chemistries  Recent Labs  Lab 12/21/19 0637 12/22/19 1321 12/23/19 2301 12/24/19 0404 12/25/19 0106  NA 135 136 137 141 138  K 4.0 4.1 4.1 3.7 3.9  CL 101 98 100 104 101  CO2 21* 25 26 29 28   GLUCOSE 244* 228* 212* 111* 134*  BUN 24* 30* 28* 24* 27*  CREATININE 0.77 0.84 0.81 0.62 0.78  CALCIUM 7.9* 8.1* 8.1* 8.2* 8.3*  AST 44* 38 27 25 28   ALT 64* 65* 57* 55* 54*  ALKPHOS 59 62 69 68 72  BILITOT 0.8 0.9 0.6 0.5 0.5   ------------------------------------------------------------------------------------------------------------------ No results for input(s): CHOL, HDL, LDLCALC, TRIG, CHOLHDL, LDLDIRECT in the last 72 hours.  Lab Results  Component Value Date   HGBA1C 10.8 (H) 14-Dec-2019    ------------------------------------------------------------------------------------------------------------------ No results for input(s): TSH, T4TOTAL, T3FREE, THYROIDAB in the last 72 hours.  Invalid input(s): FREET3 ------------------------------------------------------------------------------------------------------------------ No results for input(s): VITAMINB12, FOLATE, FERRITIN, TIBC, IRON, RETICCTPCT in the last 72 hours.  Coagulation profile No results for input(s): INR, PROTIME in the last 168 hours.  No results for input(s): DDIMER in the last 72 hours.  Cardiac Enzymes No results for input(s): CKMB, TROPONINI, MYOGLOBIN in the last 168 hours.  Invalid input(s): CK ------------------------------------------------------------------------------------------------------------------    Component Value Date/Time   BNP 20.9 12/15/2019 0555    Micro Results Recent Results (from the past 240 hour(s))  MRSA PCR Screening     Status: None   Collection Time: 12/20/19 11:22 PM   Specimen: Nasal Mucosa; Nasopharyngeal  Result Value Ref Range Status   MRSA by PCR NEGATIVE NEGATIVE Final    Comment:        The GeneXpert MRSA Assay (FDA approved for NASAL specimens only), is one component of a comprehensive MRSA colonization surveillance program. It is not intended to diagnose MRSA infection nor to guide or monitor treatment for MRSA infections. Performed at [facility], [address], [place], [place] [address]     Radiology Reports DG Chest [place] 1 View  Result Date: 12/25/2019 CLINICAL DATA:  COVID, dyspnea EXAM: PORTABLE CHEST 1 VIEW COMPARISON:  Chest radiograph from one day prior. FINDINGS: Low lung volumes. Right PICC terminates over the cavoatrial junction. Stable cardiomediastinal silhouette with normal heart size. No pneumothorax. No pleural effusion. Extensive patchy hazy opacities throughout both lungs, most prominent in the lower lungs, unchanged.  IMPRESSION: Stable extensive patchy hazy opacities throughout both lungs, most prominent in the lower lungs, compatible with COVID-19 pneumonia. Electronically Signed   By: Delbert Phenix M.D.   On: 12/25/2019 11:19   DG CHEST PORT 1 VIEW  Result Date: 12/23/2019 CLINICAL DATA:  PICC line placement EXAM: PORTABLE CHEST 1 VIEW COMPARISON:  12/23/2019 FINDINGS: Right upper extremity PICC line has been placed with its tip at the expected superior cavoatrial junction. Lung volumes are small and superimposed asymmetric airspace infiltrates are again identified, more severe within the left lung base, likely infectious or inflammatory in nature. There has, however, developed linear lucencies within the mediastinum and left hilum likely representing at least mild pneumomediastinum. Additionally, lucencies have developed at the lung apices bilaterally either representing tiny bilateral pneumothoraces, or, more likely, gas tracking within the subpleural space at the thoracic inlet given associated pneumomediastinum. Cardiac size within normal limits.  No acute bone abnormality. IMPRESSION: 1. Right upper extremity  PICC line with its tip at the expected superior cavoatrial junction. 2. Interval development of pneumomediastinum. 3. Lucencies at the lung apices bilaterally either representing tiny bilateral pneumothoraces, or, more likely,, gas tracking within the subpleural space at the thoracic inlet. This could be confirmed with CT imaging or reassessed with short-term follow-up radiographs. Electronically Signed   By: Helyn Numbers MD   On: 12/23/2019 18:39   DG Chest Port 1 View  Result Date: 12/23/2019 CLINICAL DATA:  Shortness of breath EXAM: PORTABLE CHEST 1 VIEW COMPARISON:  December 14, 2019 FINDINGS: There is airspace opacity throughout both mid and lower lung regions, similar to prior study. No consolidation. Heart is borderline enlarged with pulmonary vascularity normal. No adenopathy. No bone lesions.  IMPRESSION: Airspace opacity throughout both mid and lower lung zones, similar to prior study, likely due to atypical organism pneumonia. No consolidation. Stable cardiac silhouette. No evident adenopathy. Electronically Signed   By: Bretta Bang III M.D.   On: 12/23/2019 08:21   DG Chest Port 1 View  Result Date: 12/14/2019 CLINICAL DATA:  Shortness of breath, COVID. EXAM: PORTABLE CHEST 1 VIEW COMPARISON:  Prior chest radiographs 12/26/2019 and earlier. FINDINGS: Shallow inspiration radiograph. The cardiomediastinal silhouette is unchanged. Similar to the prior examination of 01/01/2020, there are interstitial and ill-defined airspace opacities within the left greater than right mid to lower lung fields. No definite pleural effusion. No evidence of pneumothorax. No acute bony abnormality identified. IMPRESSION: Shallow inspiration radiograph. Unchanged appearance of interstitial and ill-defined airspace opacities within the left greater than right mid-to-lower lung fields. These findings likely reflect multifocal pneumonia given the provided history of COVID positivity. Electronically Signed   By: Jackey Loge DO   On: 12/14/2019 08:20   DG Chest Port 1 View  Result Date: 01/06/2020 CLINICAL DATA:  COVID positive EXAM: PORTABLE CHEST 1 VIEW COMPARISON:  04/19/2019 chest radiograph and prior. FINDINGS: Hypoinflated lungs. Bilateral mid to lower lung patchy opacities, new since prior exam. No pneumothorax. Trace bilateral effusions. Cardiomediastinal silhouette within normal limits. IMPRESSION: New bilateral mid to lower lung patchy opacities. Hypoinflated lungs. Electronically Signed   By: Stana Bunting M.D.   On: 12/29/2019 13:57   DG Chest Port 1V same Day  Result Date: 12/24/2019 CLINICAL DATA:  COVID-19 positive. Short of breath. Follow-up exam. EXAM: PORTABLE CHEST 1 VIEW COMPARISON:  12/23/2019. FINDINGS: Small left apical pneumothorax is similar to the previous day's exam. No visualized  right pneumothorax on the current study. Pneumo mediastinum is stable. Hazy bilateral airspace lung opacities are unchanged. No new lung abnormalities. Right PICC has its tip in the lower superior vena cava, also unchanged IMPRESSION: 1. Tiny left apical pneumothorax appears similar to the previous day's exam. 2. No current evidence of a right pneumothorax. 3. Pneumo mediastinum 4. Stable well-positioned right PICC. 5. No change in bilateral hazy airspace lung opacities. Electronically Signed   By: Amie Portland M.D.   On: 12/24/2019 16:31   ECHOCARDIOGRAM LIMITED  Result Date: 12/16/2019    ECHOCARDIOGRAM LIMITED REPORT   Patient Name:   LANDIS CASSARO Date of Exam: 12/16/2019 Medical Rec #:  [id number]     Height:       62.0 in Accession #:    [id number]    Weight:       262.0 lb Date of Birth:  11-Jan-1966      BSA:          2.144 m Patient Age:    50 years  BP:           109/73 mmHg Patient Gender: F             HR:           131 bpm. Exam Location:  Inpatient Procedure: Limited Echo, Limited Color Doppler, Cardiac Doppler and Intracardiac            Opacification Agent Indications:    Acute Hypoxic Respiratory Failure  History:        Patient has prior history of Echocardiogram examinations, most                 recent 09/06/2016. Risk Factors:Hypertension and Diabetes. GERD.  Sonographer:    [name] RDCS Referring Phys: [id number] Deno Lunger [facility] IMPRESSIONS  1. Left ventricular ejection fraction, by estimation, is 60 to 65%. The left ventricle has normal function. The left ventricle has no regional wall motion abnormalities. There is mild left ventricular hypertrophy. Left ventricular diastolic function could not be evaluated.  2. The mitral valve is grossly normal.  3. The aortic valve is tricuspid. Aortic valve regurgitation is not visualized. No aortic stenosis is present. Comparison(s): Prior images unable to be directly viewed, comparison made by report only. Conclusion(s)/Recommendation(s):  Otherwise normal echocardiogram, with minor abnormalities described in the report. FINDINGS  Left Ventricle: Left ventricular ejection fraction, by estimation, is 60 to 65%. The left ventricle has normal function. The left ventricle has no regional wall motion abnormalities. Definity contrast agent was given IV to delineate the left ventricular  endocardial borders. There is mild left ventricular hypertrophy. Left ventricular diastolic function could not be evaluated. Left ventricular diastolic function could not be evaluated due to atrial fibrillation. Left Atrium: Left atrial size was normal in size. Right Atrium: Right atrial size was not well visualized. Pericardium: Trivial pericardial effusion is present. Mitral Valve: The mitral valve is grossly normal. Tricuspid Valve: The tricuspid valve is normal in structure. Tricuspid valve regurgitation is trivial. No evidence of tricuspid stenosis. Aortic Valve: The aortic valve is tricuspid. Aortic valve regurgitation is not visualized. No aortic stenosis is present. Pulmonic Valve: The pulmonic valve was not well visualized. Pulmonic valve regurgitation is not visualized. Aorta: The aortic root is normal in size and structure. Venous: The inferior vena cava was not well visualized. IAS/Shunts: The atrial septum is grossly normal. LEFT VENTRICLE PLAX 2D LVIDd:         4.50 cm LVIDs:         2.80 cm LV PW:         1.10 cm LV IVS:        1.20 cm LVOT diam:     1.80 cm LV SV:         28 LV SV Index:   13 LVOT Area:     2.54 cm  LV Volumes (MOD) LV vol d, MOD A2C: 113.0 ml LV vol d, MOD A4C: 118.0 ml LV vol s, MOD A2C: 53.6 ml LV vol s, MOD A4C: 41.4 ml LV SV MOD A2C:     59.4 ml LV SV MOD A4C:     118.0 ml LV SV MOD BP:      71.7 ml LEFT ATRIUM             Index LA diam:        3.90 cm 1.82 cm/m LA Vol (A2C):   34.2 ml 15.95 ml/m LA Vol (A4C):   23.4 ml 10.91 ml/m LA Biplane Vol: 29.0 ml 13.53 ml/m  AORTIC VALVE LVOT Vmax:   83.60 cm/s LVOT Vmean:  49.400 cm/s LVOT  VTI:    0.110 m  AORTA Ao Root diam: 2.90 cm MITRAL VALVE MV Area (PHT): 4.09 cm    SHUNTS MV Decel Time: 186 msec    Systemic VTI:  0.11 m MV E velocity: 72.40 cm/s  Systemic Diam: 1.80 cm Jodelle Red MD Electronically signed by Jodelle Red MD Signature Date/Time: 12/16/2019/9:51:17 PM    Final    [place] EKG Site Rite  Result Date: 12/23/2019 If Site Rite image not attached, placement could not be confirmed due to current cardiac rhythm.

## 2019-12-26 ENCOUNTER — Inpatient Hospital Stay (HOSPITAL_COMMUNITY): Payer: 59

## 2019-12-26 LAB — COMPREHENSIVE METABOLIC PANEL
ALT: 48 U/L — ABNORMAL HIGH (ref 0–44)
AST: 26 U/L (ref 15–41)
Albumin: 2.4 g/dL — ABNORMAL LOW (ref 3.5–5.0)
Alkaline Phosphatase: 75 U/L (ref 38–126)
Anion gap: 8 (ref 5–15)
BUN: 24 mg/dL — ABNORMAL HIGH (ref 6–20)
CO2: 31 mmol/L (ref 22–32)
Calcium: 8.5 mg/dL — ABNORMAL LOW (ref 8.9–10.3)
Chloride: 99 mmol/L (ref 98–111)
Creatinine, Ser: 0.67 mg/dL (ref 0.44–1.00)
GFR calc Af Amer: >60 mL/min (ref >60)
GFR calc non Af Amer: >60 mL/min (ref >60)
Glucose, Bld: 87 mg/dL (ref 70–99)
Potassium: 3.5 mmol/L (ref 3.5–5.1)
Sodium: 138 mmol/L (ref 135–145)
Total Bilirubin: 0.7 mg/dL (ref 0.3–1.2)
Total Protein: 6 g/dL — ABNORMAL LOW (ref 6.5–8.1)

## 2019-12-26 LAB — CBC
HCT: 45.9 % (ref 36.0–46.0)
Hemoglobin: 15.1 g/dL — ABNORMAL HIGH (ref 12.0–15.0)
MCH: 30.4 pg (ref 26.0–34.0)
MCHC: 32.9 g/dL (ref 30.0–36.0)
MCV: 92.5 fL (ref 80.0–100.0)
Platelets: 314 10*3/uL (ref 150–400)
RBC: 4.96 MIL/uL (ref 3.87–5.11)
RDW: 13.2 % (ref 11.5–15.5)
WBC: 16.7 10*3/uL — ABNORMAL HIGH (ref 4.0–10.5)
nRBC: 0 % (ref 0.0–0.2)

## 2019-12-26 LAB — C-REACTIVE PROTEIN: CRP: 11.2 mg/dL — ABNORMAL HIGH (ref <1.0)

## 2019-12-26 LAB — GLUCOSE, CAPILLARY
Glucose-Capillary: 59 mg/dL — ABNORMAL LOW (ref 70–99)
Glucose-Capillary: 52 mg/dL — ABNORMAL LOW (ref 70–99)
Glucose-Capillary: 69 mg/dL — ABNORMAL LOW (ref 70–99)
Glucose-Capillary: 146 mg/dL — ABNORMAL HIGH (ref 70–99)
Glucose-Capillary: 279 mg/dL — ABNORMAL HIGH (ref 70–99)
Glucose-Capillary: 244 mg/dL — ABNORMAL HIGH (ref 70–99)

## 2019-12-26 MED ORDER — INSULIN DETEMIR 100 UNIT/ML ~~LOC~~ SOLN
15.0000 [IU] | Freq: Every morning | SUBCUTANEOUS | Status: DC
  Administered 2019-12-27: 15 [IU] via SUBCUTANEOUS
  Filled 2019-12-26: qty 0.15

## 2019-12-26 MED ORDER — DIPHENHYDRAMINE HCL 50 MG/ML IJ SOLN: 12.5000 mg | Freq: Four times a day (QID) | INTRAMUSCULAR | Status: DC | PRN

## 2019-12-26 MED ORDER — SODIUM CHLORIDE 0.9 % IV SOLN
2.0000 g | Freq: Three times a day (TID) | INTRAVENOUS | Status: AC
  Administered 2019-12-26 – 2019-12-31 (×15): 2 g via INTRAVENOUS
  Filled 2019-12-26 (×15): qty 2

## 2019-12-26 NOTE — Care Management (Signed)
Assessed for LTACH at request of attending. Patient stay does not include the required ICU revenue code to be considered for [facility]

## 2019-12-26 NOTE — Progress Notes (Signed)
PROGRESS NOTE                                                                                                                                                                                                             Patient Demographics:    Linda Richmond, is a 54 y.o. female, DOB - 07-Jan-1966, YFV:[id number]  Outpatient Primary MD for the patient is Ignatius Specking, MD   Admit date - 01/03/2020   LOS - 16  Chief Complaint  Patient presents with  . Shortness of Breath       Brief Narrative: Patient is a 54 y.o. female with PMHx of anxiety, depression, DM-2, HTN, seronegative RA-who tested positive for COVID-19 on 8/27-admitted to [facility] on 9/3 with acute hypoxemic respiratory failure secondary to COVID-19 pneumonia.  Unfortunately hospital course complicated by worsening hypoxemia requiring HFNC.  Transferred to [facility] for further evaluation and treatment.  See below for further details  COVID-19 vaccinated status: Unvaccinated  Significant Events: 8/27>> COVID-19 positive 9/3>> Admit to [facility] for hypoxemia secondary to COVID-19 9/6>> transferred to [facility]-worsening hypoxemia on 15 L of HFNC 9/8>> A. fib with RVR  Significant studies: 9/3>>Chest x-ray: New bilateral mid to lower lung patchy opacities 9/9>> Echo: EF 60-65% 9/16>> chest x-ray: Airspace opacity throughout mid/lower lungs 9/16>> chest x-ray: Interval development of pneumomediastinum 9/17>> chest x-ray: Pneumomediastinum, tiny left apical pneumothorax-no change in bilateral airspace opacities 9/18>> chest x-ray: Stable extensive patchy hazy opacities-no pneumothorax. 9/19>> chest x-ray: Persistent airspace opacities over mid to lower lungs bilaterally.  COVID-19 medications: Steroids: 9/3>> Remdesivir: 9/3>> 9/7 Baricitinib: 9/5>>9/18  Antibiotics: None  Microbiology data: 9/20 >>blood culture: 1/2-Staph epidermidis (likely  contaminant)  Procedures: 9/16>> PICC line placement  Consults: Cardiology  DVT prophylaxis: SCDs Start: 01/02/2020 1527 Eliquis    Subjective:   Remains unchanged-on 15 L of HFNC and NRB.  No major events overnight.   Assessment  & Plan :   Acute Hypoxic Resp Failure due to Covid 19 Viral pneumonia: Continues to have severe hypoxemia-still requiring 15 L of HFNC and NRB.  CRP had normalized but over the past few days-has increased but seems to have plateaued.  Leukocytosis worsening but which is likely from steroids-patient is afebrile-but given persistent hypoxemia/worsening leukocytosis-persistent infiltrates on chest x-ray-we will load and placed patient on empiric cefepime for 5 days.  MRSA PCR negative.  Continue to attempt to slowly titrate down FiO2-repeat Lasix 40 mg IV x1 today.    Fever: afebrile O2 requirements:  SpO2: 90 % O2 Flow Rate (L/min): 15 L/min FiO2 (%): 94 %   COVID-19 Labs: Recent Labs    12/24/19 0404 12/25/19 0106 12/26/19 0500  CRP 11.7* 12.3* 11.2*       Component Value Date/Time   BNP 20.9 12/15/2019 0555    Recent Labs  Lab 12/25/19 0106  PROCALCITON <0.10    Lab Results  Component Value Date   SARSCOV2NAA POSITIVE (A) 12/26/2019   SARSCOV2NAA Not Detected 04/13/2019   SARSCOV2NAA Not Detected 02/02/2019   SARSCOV2NAA Not Detected 01/22/2019      Prone/Incentive Spirometry: encouraged patient to lie prone for 3-4 hours at a time for a total of 16 hours a day, and to encourage incentive spirometry use 3-4/hour.  Pneumomediastinum with small left apical pneumothorax: Likely sequelae of severe parenchymal injury.  Serial chest x-ray done-chest x-ray on 9/19 without any obvious pneumothorax.  Epistaxis: Resolved-continue supportive care.  A. fib with RVR: Maintaining sinus rhythm-continue Cardizem/Lopressor-remains on Eliquis.   DM-2 (A1c 10.8) with steroid induced hyperglycemia: Hypoglycemic episode this morning-stop p.m. dose  of Levemir-stop scheduled Premeal NovoLog-decrease Levemir to 15 units daily-continue SSI-reassess in the next 24 hours.  Suspect as steroids get tapered down-we will require less insulin.   Recent Labs    12/26/19 0852 12/26/19 0854 12/26/19 1203  GLUCAP 52* 69* 146*   HTN: BP controlled-continue metoprolol and Cardizem.  Hypothyroidism: Continue thyroid Armour-TSH within normal limits  GERD: Continue PPI  Positive blood culture-1/2+ for staph epidermidis-likely represents a contaminant-no treatment required.  Nutrition Problem: Nutrition Problem: Increased nutrient needs Etiology: acute illness (COVID-19) Signs/Symptoms: estimated needs Interventions: Glucerna shake, MVI, Premier Protein  Morbid Obesity: Estimated body mass index is 44.88 kg/m as calculated from the following:   Height as of this encounter: 5\' 2"  (1.575 m).   Weight as of this encounter: 111.3 kg.    ABG: No results found for: PHART, PCO2ART, PO2ART, HCO3, TCO2, ACIDBASEDEF, O2SAT  Vent Settings:     Condition - Extremely Guarded  Family Communication  :  Daughter Marchelle Folks [phone]) phone on 9/19  Code Status :  Full Code  Diet :  Diet Order            Diet Carb Modified Fluid consistency: Thin; Room service appropriate? Yes  Diet effective now                  Disposition Plan  :   Status is: Inpatient  Remains inpatient appropriate because:Inpatient level of care appropriate due to severity of illness  Dispo: The patient is from: Home              Anticipated d/c is to: Home              Anticipated d/c date is: > 3 days              Patient currently is not medically stable to d/c.   Barriers to discharge: Hypoxia requiring O2 supplementation  Antimicorbials  :    Anti-infectives (From admission, onward)   Start     Dose/Rate Route Frequency Ordered Stop   12/11/19 1000  remdesivir 100 mg in sodium chloride 0.9 % 100 mL IVPB  Status:  Discontinued       "Followed by"  Linked Group Details   100 mg 200 mL/hr over 30 Minutes Intravenous  Daily January 07, 2020 1529 01/07/20 1532   12/11/19 1000  remdesivir 100 mg in sodium chloride 0.9 % 100 mL IVPB        100 mg 200 mL/hr over 30 Minutes Intravenous Daily January 07, 2020 1533 12/14/19 0907   January 07, 2020 1545  remdesivir 100 mg in sodium chloride 0.9 % 100 mL IVPB        100 mg 200 mL/hr over 30 Minutes Intravenous Every 30 min 2020-01-07 1533 01/07/2020 1850   2020/01/07 1530  remdesivir 200 mg in sodium chloride 0.9% 250 mL IVPB  Status:  Discontinued       "Followed by" Linked Group Details   200 mg 580 mL/hr over 30 Minutes Intravenous Once 2020/01/07 1529 07-Jan-2020 1532      Inpatient Medications  Scheduled Meds: . (feeding supplement) PROSource Plus  30 mL Oral BID BM  . apixaban  5 mg Oral BID  . Chlorhexidine Gluconate Cloth  6 each Topical Daily  . diltiazem  120 mg Oral Daily  . feeding supplement (GLUCERNA SHAKE)  237 mL Oral TID BM  . fluticasone  2 spray Each Nare Daily  . insulin aspart  0-20 Units Subcutaneous TID WC  . insulin aspart  0-5 Units Subcutaneous QHS  . insulin aspart  10 Units Subcutaneous TID WC  . insulin detemir  10 Units Subcutaneous QHS  . insulin detemir  20 Units Subcutaneous q morning - 10a  . Ipratropium-Albuterol  1 puff Inhalation Q6H  . linagliptin  5 mg Oral Daily  . loratadine  10 mg Oral Daily  . mouth rinse  15 mL Mouth Rinse BID  . metoprolol tartrate  50 mg Oral BID  . predniSONE  40 mg Oral Q breakfast  . sodium chloride flush  10-40 mL Intracatheter Q12H  . thyroid  30 mg Oral QAC breakfast  . Vitamin D (Ergocalciferol)  50,000 Units Oral Q7 days   Continuous Infusions: . sodium chloride 250 mL (12/12/19 0851)   PRN Meds:.sodium chloride, acetaminophen, ALPRAZolam, chlorpheniramine-HYDROcodone, guaiFENesin-dextromethorphan, lip balm, metoprolol tartrate, oxymetazoline, pantoprazole, sodium chloride, sodium chloride flush   See all Orders from today for further  details   Jeoffrey Massed M.D on 12/26/2019 at 1:31 PM  To page go to [email or web address] - use universal password  [facility] -  Office  [phone]    Objective:   Vitals:   12/26/19 0004 12/26/19 0342 12/26/19 0726 12/26/19 1156  BP: 122/82 (!) 125/101 106/62 119/62  Pulse: 67 69 71 61  Resp: 20 20    Temp: 97.8 F (36.6 C) 97.9 F (36.6 C) 98.1 F (36.7 C) 97.8 F (36.6 C)  TempSrc: Axillary Axillary Oral Axillary  SpO2: 92% 91% 91% 90%  Weight:  111.3 kg    Height:        Wt Readings from Last 3 Encounters:  12/26/19 111.3 kg  10/25/16 121.1 kg  08/23/16 126.6 kg     Intake/Output Summary (Last 24 hours) at 12/26/2019 1331 Last data filed at 12/26/2019 0832 Gross per 24 hour  Intake 480 ml  Output --  Net 480 ml     Physical Exam Gen Exam:Alert awake-not in any distress HEENT:atraumatic, normocephalic Chest: B/L clear to auscultation anteriorly CVS:S1S2 regular Abdomen:soft non tender, non distended Extremities:no edema Neurology: Non focal Skin: no rash   Data Review:    CBC Recent Labs  Lab 12/22/19 1321 12/23/19 2301 12/24/19 0404 12/25/19 0106 12/26/19 0500  WBC 12.7* 13.2* 14.1* 13.2* 16.7*  HGB 15.2* 14.6 14.7 15.0 15.1*  HCT  46.0 43.9 45.1 45.9 45.9  PLT 344 323 308 324 314  MCV 88.6 90.7 90.4 92.4 92.5  MCH 29.3 30.2 29.5 30.2 30.4  MCHC 33.0 33.3 32.6 32.7 32.9  RDW 12.6 12.9 13.0 13.0 13.2    Chemistries  Recent Labs  Lab 12/22/19 1321 12/23/19 2301 12/24/19 0404 12/25/19 0106 12/26/19 0500  NA 136 137 141 138 138  K 4.1 4.1 3.7 3.9 3.5  CL 98 100 104 101 99  CO2 GLUCOSE 228* 212* 111* 134* 87  BUN 30* 28* 24* 27* 24*  CREATININE 0.84 0.81 0.62 0.78 0.67  CALCIUM 8.1* 8.1* 8.2* 8.3* 8.5*  AST 38 ALT 65* 57* 55* 54* 48*  ALKPHOS 62 69 68 72 75  BILITOT 0.9 0.6 0.5 0.5 0.7    ------------------------------------------------------------------------------------------------------------------ No results for input(s): CHOL, HDL, LDLCALC, TRIG, CHOLHDL, LDLDIRECT in the last 72 hours.  Lab Results  Component Value Date   HGBA1C 10.8 (H) 12/11/2019   ------------------------------------------------------------------------------------------------------------------ No results for input(s): TSH, T4TOTAL, T3FREE, THYROIDAB in the last 72 hours.  Invalid input(s): FREET3 ------------------------------------------------------------------------------------------------------------------ No results for input(s): VITAMINB12, FOLATE, FERRITIN, TIBC, IRON, RETICCTPCT in the last 72 hours.  Coagulation profile No results for input(s): INR, PROTIME in the last 168 hours.  No results for input(s): DDIMER in the last 72 hours.  Cardiac Enzymes No results for input(s): CKMB, TROPONINI, MYOGLOBIN in the last 168 hours.  Invalid input(s): CK ------------------------------------------------------------------------------------------------------------------    Component Value Date/Time   BNP 20.9 12/15/2019 0555    Micro Results Recent Results (from the past 240 hour(s))  MRSA PCR Screening     Status: None   Collection Time: 12/20/19 11:22 PM   Specimen: Nasal Mucosa; Nasopharyngeal  Result Value Ref Range Status   MRSA by PCR NEGATIVE NEGATIVE Final    Comment:        The GeneXpert MRSA Assay (FDA approved for NASAL specimens only), is one component of a comprehensive MRSA colonization surveillance program. It is not intended to diagnose MRSA infection nor to guide or monitor treatment for MRSA infections. Performed at [facility], [address], [place], [place] [address]     Radiology Reports DG Chest [place] 1 View  Result Date: 12/26/2019 CLINICAL DATA:  Shortness of breath. EXAM: PORTABLE CHEST 1 VIEW COMPARISON:  12/25/2019 FINDINGS: Right-sided  PICC line has tip over the SVC. Lungs are hypoinflated demonstrate persistent hazy airspace process over the mid to lower lungs bilaterally without significant change. No effusion. Cardiomediastinal silhouette and remainder of the exam is unchanged. IMPRESSION: Persistent hazy airspace process over the mid to lower lungs bilaterally without significant change compatible with multifocal viral pneumonia in this COVID-19 positive patient. Electronically Signed   By: Elberta Fortis M.D.   On: 12/26/2019 09:01   DG Chest Port 1 View  Result Date: 12/25/2019 CLINICAL DATA:  COVID, dyspnea EXAM: PORTABLE CHEST 1 VIEW COMPARISON:  Chest radiograph from one day prior. FINDINGS: Low lung volumes. Right PICC terminates over the cavoatrial junction. Stable cardiomediastinal silhouette with normal heart size. No pneumothorax. No pleural effusion. Extensive patchy hazy opacities throughout both lungs, most prominent in the lower lungs, unchanged. IMPRESSION: Stable extensive patchy hazy opacities throughout both lungs, most prominent in the lower lungs, compatible with COVID-19 pneumonia. Electronically Signed   By: Delbert Phenix M.D.   On: 12/25/2019 11:19   DG CHEST PORT 1 VIEW  Result Date: 12/23/2019 CLINICAL DATA:  PICC line  placement EXAM: PORTABLE CHEST 1 VIEW COMPARISON:  12/23/2019 FINDINGS: Right upper extremity PICC line has been placed with its tip at the expected superior cavoatrial junction. Lung volumes are small and superimposed asymmetric airspace infiltrates are again identified, more severe within the left lung base, likely infectious or inflammatory in nature. There has, however, developed linear lucencies within the mediastinum and left hilum likely representing at least mild pneumomediastinum. Additionally, lucencies have developed at the lung apices bilaterally either representing tiny bilateral pneumothoraces, or, more likely, gas tracking within the subpleural space at the thoracic inlet given  associated pneumomediastinum. Cardiac size within normal limits.  No acute bone abnormality. IMPRESSION: 1. Right upper extremity PICC line with its tip at the expected superior cavoatrial junction. 2. Interval development of pneumomediastinum. 3. Lucencies at the lung apices bilaterally either representing tiny bilateral pneumothoraces, or, more likely,, gas tracking within the subpleural space at the thoracic inlet. This could be confirmed with CT imaging or reassessed with short-term follow-up radiographs. Electronically Signed   By: Helyn Numbers MD   On: 12/23/2019 18:39   DG Chest Port 1 View  Result Date: 12/23/2019 CLINICAL DATA:  Shortness of breath EXAM: PORTABLE CHEST 1 VIEW COMPARISON:  December 14, 2019 FINDINGS: There is airspace opacity throughout both mid and lower lung regions, similar to prior study. No consolidation. Heart is borderline enlarged with pulmonary vascularity normal. No adenopathy. No bone lesions. IMPRESSION: Airspace opacity throughout both mid and lower lung zones, similar to prior study, likely due to atypical organism pneumonia. No consolidation. Stable cardiac silhouette. No evident adenopathy. Electronically Signed   By: Bretta Bang III M.D.   On: 12/23/2019 08:21   DG Chest Port 1 View  Result Date: 12/14/2019 CLINICAL DATA:  Shortness of breath, COVID. EXAM: PORTABLE CHEST 1 VIEW COMPARISON:  Prior chest radiographs 12/09/2019 and earlier. FINDINGS: Shallow inspiration radiograph. The cardiomediastinal silhouette is unchanged. Similar to the prior examination of 01/01/2020, there are interstitial and ill-defined airspace opacities within the left greater than right mid to lower lung fields. No definite pleural effusion. No evidence of pneumothorax. No acute bony abnormality identified. IMPRESSION: Shallow inspiration radiograph. Unchanged appearance of interstitial and ill-defined airspace opacities within the left greater than right mid-to-lower lung fields.  These findings likely reflect multifocal pneumonia given the provided history of COVID positivity. Electronically Signed   By: Jackey Loge DO   On: 12/14/2019 08:20   DG Chest Port 1 View  Result Date: 01/05/2020 CLINICAL DATA:  COVID positive EXAM: PORTABLE CHEST 1 VIEW COMPARISON:  04/19/2019 chest radiograph and prior. FINDINGS: Hypoinflated lungs. Bilateral mid to lower lung patchy opacities, new since prior exam. No pneumothorax. Trace bilateral effusions. Cardiomediastinal silhouette within normal limits. IMPRESSION: New bilateral mid to lower lung patchy opacities. Hypoinflated lungs. Electronically Signed   By: Stana Bunting M.D.   On: 12/13/2019 13:57   DG Chest Port 1V same Day  Result Date: 12/24/2019 CLINICAL DATA:  COVID-19 positive. Short of breath. Follow-up exam. EXAM: PORTABLE CHEST 1 VIEW COMPARISON:  12/23/2019. FINDINGS: Small left apical pneumothorax is similar to the previous day's exam. No visualized right pneumothorax on the current study. Pneumo mediastinum is stable. Hazy bilateral airspace lung opacities are unchanged. No new lung abnormalities. Right PICC has its tip in the lower superior vena cava, also unchanged IMPRESSION: 1. Tiny left apical pneumothorax appears similar to the previous day's exam. 2. No current evidence of a right pneumothorax. 3. Pneumo mediastinum 4. Stable well-positioned right PICC. 5. No change in  bilateral hazy airspace lung opacities. Electronically Signed   By: Amie Portland M.D.   On: 12/24/2019 16:31   ECHOCARDIOGRAM LIMITED  Result Date: 12/16/2019    ECHOCARDIOGRAM LIMITED REPORT   Patient Name:   NAHIMA ALES Date of Exam: 12/16/2019 Medical Rec #:  [id number]     Height:       62.0 in Accession #:    [id number]    Weight:       262.0 lb Date of Birth:  [birth year]      BSA:          2.144 m Patient Age:    54 years      BP:           109/73 mmHg Patient Gender: F             HR:           131 bpm. Exam Location:  Inpatient Procedure: Limited  Echo, Limited Color Doppler, Cardiac Doppler and Intracardiac            Opacification Agent Indications:    Acute Hypoxic Respiratory Failure  History:        Patient has prior history of Echocardiogram examinations, most                 recent 09/06/2016. Risk Factors:Hypertension and Diabetes. GERD.  Sonographer:    [name] RDCS Referring Phys: [id number] Deno Lunger [facility] IMPRESSIONS  1. Left ventricular ejection fraction, by estimation, is 60 to 65%. The left ventricle has normal function. The left ventricle has no regional wall motion abnormalities. There is mild left ventricular hypertrophy. Left ventricular diastolic function could not be evaluated.  2. The mitral valve is grossly normal.  3. The aortic valve is tricuspid. Aortic valve regurgitation is not visualized. No aortic stenosis is present. Comparison(s): Prior images unable to be directly viewed, comparison made by report only. Conclusion(s)/Recommendation(s): Otherwise normal echocardiogram, with minor abnormalities described in the report. FINDINGS  Left Ventricle: Left ventricular ejection fraction, by estimation, is 60 to 65%. The left ventricle has normal function. The left ventricle has no regional wall motion abnormalities. Definity contrast agent was given IV to delineate the left ventricular  endocardial borders. There is mild left ventricular hypertrophy. Left ventricular diastolic function could not be evaluated. Left ventricular diastolic function could not be evaluated due to atrial fibrillation. Left Atrium: Left atrial size was normal in size. Right Atrium: Right atrial size was not well visualized. Pericardium: Trivial pericardial effusion is present. Mitral Valve: The mitral valve is grossly normal. Tricuspid Valve: The tricuspid valve is normal in structure. Tricuspid valve regurgitation is trivial. No evidence of tricuspid stenosis. Aortic Valve: The aortic valve is tricuspid. Aortic valve regurgitation is not visualized. No  aortic stenosis is present. Pulmonic Valve: The pulmonic valve was not well visualized. Pulmonic valve regurgitation is not visualized. Aorta: The aortic root is normal in size and structure. Venous: The inferior vena cava was not well visualized. IAS/Shunts: The atrial septum is grossly normal. LEFT VENTRICLE PLAX 2D LVIDd:         4.50 cm LVIDs:         2.80 cm LV PW:         1.10 cm LV IVS:        1.20 cm LVOT diam:     1.80 cm LV SV:         28 LV SV Index:   13 LVOT Area:     2.54  cm  LV Volumes (MOD) LV vol d, MOD A2C: 113.0 ml LV vol d, MOD A4C: 118.0 ml LV vol s, MOD A2C: 53.6 ml LV vol s, MOD A4C: 41.4 ml LV SV MOD A2C:     59.4 ml LV SV MOD A4C:     118.0 ml LV SV MOD BP:      71.7 ml LEFT ATRIUM             Index LA diam:        3.90 cm 1.82 cm/m LA Vol (A2C):   34.2 ml 15.95 ml/m LA Vol (A4C):   23.4 ml 10.91 ml/m LA Biplane Vol: 29.0 ml 13.53 ml/m  AORTIC VALVE LVOT Vmax:   83.60 cm/s LVOT Vmean:  49.400 cm/s LVOT VTI:    0.110 m  AORTA Ao Root diam: 2.90 cm MITRAL VALVE MV Area (PHT): 4.09 cm    SHUNTS MV Decel Time: 186 msec    Systemic VTI:  0.11 m MV E velocity: 72.40 cm/s  Systemic Diam: 1.80 cm Jodelle Red MD Electronically signed by Jodelle Red MD Signature Date/Time: 12/16/2019/9:51:17 PM    Final    [place] EKG Site Rite  Result Date: 12/23/2019 If Site Rite image not attached, placement could not be confirmed due to current cardiac rhythm.

## 2019-12-26 NOTE — Progress Notes (Addendum)
Pharmacy Antibiotic Note  Linda Richmond is a 54 y.o. female admitted on 12-14-19 with COIVD pneumonia and now possible hospital acquired pneumonia.  Pharmacy has been consulted for cefepime dosing. Pt has elevated WBC of 16.7, remains afebrile with persistent infiltrates on chest xray. I called the patient to confirm allergy and reaction. Pt reported childhood reaction from "many years ago" of hives/rash with penicillin but didn't remember cefazolin specifically. I spoke with the RN and notified her of treatment to watch for signs of hives/rash in this patient. Given distant reaction of hives/rash will proceed with therapy.    Plan: cefepime 2g IV q8h for 5 days of therapy Benadryl 12.5mg  IV q6h PRN rash/hives Closely monitor pt for signs of rash/hives, monitor CBC, clinical progress, and renal function   Height: 5\' 2"  (157.5 cm) Weight: 111.3 kg (245 lb 6 oz) IBW/kg (Calculated) : 50.1  Temp (24hrs), Avg:98 F (36.7 C), Min:97.8 F (36.6 C), Max:98.4 F (36.9 C)  Recent Labs  Lab 12/22/19 1321 12/23/19 2301 12/24/19 0404 12/25/19 0106 12/26/19 0500  WBC 12.7* 13.2* 14.1* 13.2* 16.7*  CREATININE 0.84 0.81 0.62 0.78 0.67    Estimated Creatinine Clearance: 92.5 mL/min (by C-G formula based on SCr of 0.67 mg/dL).    Allergies  Allergen Reactions  . Ancef [Cefazolin] Hives  . Aspirin Other (See Comments)    GI Bleed  . Doxycycline Diarrhea and Nausea And Vomiting  . Sulfa Antibiotics Hives    Possible loss of consciousness  . Ciprofloxacin Rash    "feels like bugs are crawling in my head"  . Lesinurad-Allopurinol Nausea And Vomiting  . Penicillins Rash    Antimicrobials this admission: Remdesivir 9/3 >> 9/7 Baricitinib 9/5 >> (9/18) 9/19 cefepime >>(9/23, entered)  Microbiology results: 9/3 COVID pos 9/3 blood cx: staph epi x1/3 >> likely contaminant 9/14 MRSA neg  Thank you for allowing pharmacy to be a part of this patient's care.  10/14  PGY1  pharmacy resident 12/26/2019 2:02 PM

## 2019-12-26 NOTE — Plan of Care (Signed)
  Problem: Education: Goal: Knowledge of General Education information will improve Description Including pain rating scale, medication(s)/side effects and non-pharmacologic comfort measures Outcome: Progressing   Problem: Health Behavior/Discharge Planning: Goal: Ability to manage health-related needs will improve Outcome: Progressing   

## 2019-12-27 ENCOUNTER — Inpatient Hospital Stay (HOSPITAL_COMMUNITY): Payer: 59

## 2019-12-27 LAB — CBC
HCT: 44.2 % (ref 36.0–46.0)
Hemoglobin: 14.1 g/dL (ref 12.0–15.0)
MCH: 29.6 pg (ref 26.0–34.0)
MCHC: 31.9 g/dL (ref 30.0–36.0)
MCV: 92.7 fL (ref 80.0–100.0)
Platelets: 281 10*3/uL (ref 150–400)
RBC: 4.77 MIL/uL (ref 3.87–5.11)
RDW: 13.2 % (ref 11.5–15.5)
WBC: 19.9 10*3/uL — ABNORMAL HIGH (ref 4.0–10.5)
nRBC: 0 % (ref 0.0–0.2)

## 2019-12-27 LAB — COMPREHENSIVE METABOLIC PANEL
ALT: 45 U/L — ABNORMAL HIGH (ref 0–44)
AST: 31 U/L (ref 15–41)
Albumin: 2.3 g/dL — ABNORMAL LOW (ref 3.5–5.0)
Alkaline Phosphatase: 79 U/L (ref 38–126)
Anion gap: 12 (ref 5–15)
BUN: 19 mg/dL (ref 6–20)
CO2: 27 mmol/L (ref 22–32)
Calcium: 8 mg/dL — ABNORMAL LOW (ref 8.9–10.3)
Chloride: 97 mmol/L — ABNORMAL LOW (ref 98–111)
Creatinine, Ser: 0.76 mg/dL (ref 0.44–1.00)
GFR calc Af Amer: >60 mL/min (ref >60)
GFR calc non Af Amer: >60 mL/min (ref >60)
Glucose, Bld: 274 mg/dL — ABNORMAL HIGH (ref 70–99)
Potassium: 4 mmol/L (ref 3.5–5.1)
Sodium: 136 mmol/L (ref 135–145)
Total Bilirubin: 0.7 mg/dL (ref 0.3–1.2)
Total Protein: 5.7 g/dL — ABNORMAL LOW (ref 6.5–8.1)

## 2019-12-27 LAB — GLUCOSE, CAPILLARY
Glucose-Capillary: 131 mg/dL — ABNORMAL HIGH (ref 70–99)
Glucose-Capillary: 272 mg/dL — ABNORMAL HIGH (ref 70–99)
Glucose-Capillary: 312 mg/dL — ABNORMAL HIGH (ref 70–99)
Glucose-Capillary: 389 mg/dL — ABNORMAL HIGH (ref 70–99)
Glucose-Capillary: 413 mg/dL — ABNORMAL HIGH (ref 70–99)
Glucose-Capillary: 436 mg/dL — ABNORMAL HIGH (ref 70–99)

## 2019-12-27 LAB — D-DIMER, QUANTITATIVE: D-Dimer, Quant: 1.99 ug/mL-FEU — ABNORMAL HIGH (ref 0.00–0.50)

## 2019-12-27 LAB — C-REACTIVE PROTEIN: CRP: 12.5 mg/dL — ABNORMAL HIGH (ref <1.0)

## 2019-12-27 MED ORDER — FUROSEMIDE 10 MG/ML IJ SOLN
40.0000 mg | Freq: Once | INTRAMUSCULAR | Status: AC
  Administered 2019-12-27: 40 mg via INTRAVENOUS
  Filled 2019-12-27: qty 4

## 2019-12-27 MED ORDER — INSULIN ASPART 100 UNIT/ML ~~LOC~~ SOLN
5.0000 [IU] | Freq: Three times a day (TID) | SUBCUTANEOUS | Status: DC
  Administered 2019-12-27: 5 [IU] via SUBCUTANEOUS

## 2019-12-27 MED ORDER — INSULIN DETEMIR 100 UNIT/ML ~~LOC~~ SOLN
15.0000 [IU] | Freq: Two times a day (BID) | SUBCUTANEOUS | Status: DC
  Administered 2019-12-27: 15 [IU] via SUBCUTANEOUS
  Filled 2019-12-27 (×3): qty 0.15

## 2019-12-27 MED ORDER — METHYLPREDNISOLONE SODIUM SUCC 40 MG IJ SOLR
40.0000 mg | Freq: Two times a day (BID) | INTRAMUSCULAR | Status: DC
  Administered 2019-12-27 (×2): 40 mg via INTRAVENOUS
  Filled 2019-12-27 (×2): qty 1

## 2019-12-27 NOTE — TOC Transition Note (Signed)
Transition of Care [facility]) - CM/SW Discharge Note   Patient Details  Name: Linda Richmond MRN: [id number] Date of Birth: Apr 17, 1965  [facility] CM/SW Contact:  Lockie Pares, RN Phone Number: 12/27/2019, 12:10 PM   Clinical Narrative:    Continues on high flow, now with potential bacterial pneumonitis, starting on IV antibiotics. Will continue to follow     Barriers to Discharge: Continued Medical Work up   Patient Goals and CMS Choice        Discharge Placement                       Discharge Plan and Services   Discharge Planning Services: CM Consult                                 Social Determinants of Health (SDOH) Interventions     Readmission Risk Interventions No flowsheet data found.

## 2019-12-27 NOTE — Progress Notes (Signed)
PROGRESS NOTE                                                                                                                                                                                                             Patient Demographics:    Linda Richmond, is a 54 y.o. female, DOB - 1966-02-22, NWG:[id number]  Outpatient Primary MD for the patient is Ignatius Specking, MD   Admit date - 12/19/2019   LOS - 17  Chief Complaint  Patient presents with  . Shortness of Breath       Brief Narrative: Patient is a 54 y.o. female with PMHx of anxiety, depression, DM-2, HTN, seronegative RA-who tested positive for COVID-19 on 8/27-admitted to [facility] on 9/3 with acute hypoxemic respiratory failure secondary to COVID-19 pneumonia.  Unfortunately hospital course complicated by worsening hypoxemia requiring HFNC.  Transferred to [facility] for further evaluation and treatment.  See below for further details  COVID-19 vaccinated status: Unvaccinated  Significant Events: 8/27>> COVID-19 positive 9/3>> Admit to [facility] for hypoxemia secondary to COVID-19 9/6>> transferred to [facility]-worsening hypoxemia on 15 L of HFNC 9/8>> A. fib with RVR  Significant studies: 9/3>>Chest x-ray: New bilateral mid to lower lung patchy opacities 9/9>> Echo: EF 60-65% 9/16>> chest x-ray: Airspace opacity throughout mid/lower lungs 9/16>> chest x-ray: Interval development of pneumomediastinum 9/17>> chest x-ray: Pneumomediastinum, tiny left apical pneumothorax-no change in bilateral airspace opacities 9/18>> chest x-ray: Stable extensive patchy hazy opacities-no pneumothorax. 9/19>> chest x-ray: Persistent airspace opacities over mid to lower lungs bilaterally. 9/20>> chest x-ray: Persistent streaky infiltrates to bilateral lungs.  COVID-19 medications: Steroids: 9/3>> Remdesivir: 9/3>> 9/7 Baricitinib:  9/5>>9/18  Antibiotics: Cefepime:9/19>>  Microbiology data: 9/20 >>blood culture: 1/2-Staph epidermidis (likely contaminant)  Procedures: 9/16>> PICC line placement  Consults: Cardiology  DVT prophylaxis: SCDs Start: 12/21/2019 1527 Eliquis    Subjective:   Remains severely hypoxic but essentially unchanged over the past week or so.  On 15 L HFNC and NRB.  Appears very comfortable at rest.   Assessment  & Plan :   Acute Hypoxic Resp Failure due to Covid 19 Viral pneumonia: Continues to have severe hypoxemia-has been on 15 L of HFNC and NRB for almost a week-no signs of any clinical improvement as of now.  CRP slowly trending up.  Stop prednisone-restart IV Solu-Medrol-started empiric cefepime  on 9/19.  No signs of volume overload-but will go and give IV Lasix to ensure negative balance.  Continue to watch closely-if deteriorates-needs transfer to the ICU.  Fever: afebrile O2 requirements:  SpO2: 90 % O2 Flow Rate (L/min): 15 L/min FiO2 (%): 94 %   COVID-19 Labs: Recent Labs    12/25/19 0106 12/26/19 0500 12/27/19 0354 12/27/19 0922  DDIMER  --   --   --  1.99*  CRP 12.3* 11.2* 12.5*  --        Component Value Date/Time   BNP 20.9 12/15/2019 0555    Recent Labs  Lab 12/25/19 0106  PROCALCITON <0.10    Lab Results  Component Value Date   SARSCOV2NAA POSITIVE (A) 12/22/2019   SARSCOV2NAA Not Detected 04/13/2019   SARSCOV2NAA Not Detected 02/02/2019   SARSCOV2NAA Not Detected 01/22/2019      Prone/Incentive Spirometry: encouraged patient to lie prone for 3-4 hours at a time for a total of 16 hours a day, and to encourage incentive spirometry use 3-4/hour.  Pneumomediastinum with small left apical pneumothorax: Likely sequelae of severe parenchymal injury.  Most recent chest x-ray is without any obvious pneumothorax.  Epistaxis: Resolved-continue supportive care.  A. fib with RVR: Maintaining sinus rhythm-continue Cardizem/Lopressor-remains on Eliquis.    DM-2 (A1c 10.8) with steroid induced hyperglycemia: Had hypoglycemic episodes yesterday-Levemir dosage adjusted-currently on 15 units of Levemir and SSI.  Follow and adjust.   Recent Labs    12/26/19 2054 12/27/19 0721 12/27/19 1154  GLUCAP 244* 131* 272*   HTN: BP controlled-continue metoprolol and Cardizem.  Hypothyroidism: Continue thyroid Arm[place]-TSH within normal limits  GERD: Continue PPI  Positive blood culture-1/2+ for staph epidermidis-likely represents a contaminant-no treatment required.  Nutrition Problem: Nutrition Problem: Increased nutrient needs Etiology: acute illness (COVID-19) Signs/Symptoms: estimated needs Interventions: Glucerna shake, MVI, Premier Protein  Morbid Obesity: Estimated body mass index is 45.73 kg/m as calculated from the following:   Height as of this encounter:  (1.575 m).   Weight as of this encounter: 113.4 kg.    ABG: No results found for: PHART, PCO2ART, PO2ART, HCO3, TCO2, ACIDBASEDEF, O2SAT  Vent Settings:     Condition - Extremely Guarded  Family Communication  :  Daughter Marchelle Folks [phone]) phone on 9/20  Code Status :  Full Code  Diet :  Diet Order            Diet Carb Modified Fluid consistency: Thin; Room service appropriate? Yes  Diet effective now                  Disposition Plan  :   Status is: Inpatient  Remains inpatient appropriate because:Inpatient level of care appropriate due to severity of illness  Dispo: The patient is from: Home              Anticipated d/c is to: Home              Anticipated d/c date is: > 3 days              Patient currently is not medically stable to d/c.   Barriers to discharge: Hypoxia requiring O2 supplementation  Antimicorbials  :    Anti-infectives (From admission, onward)   Start     Dose/Rate Route Frequency Ordered Stop   12/26/19 1415  ceFEPIme (MAXIPIME) 2 g in sodium chloride 0.9 % 100 mL IVPB        2 g 200 mL/hr over 30 Minutes Intravenous  Every 8  hours 12/26/19 1402 12/31/19 1359   12/11/19 1000  remdesivir 100 mg in sodium chloride 0.9 % 100 mL IVPB  Status:  Discontinued       "Followed by" Linked Group Details   100 mg 200 mL/hr over 30 Minutes Intravenous Daily 12/25/2019 1529 01/04/2020 1532   12/11/19 1000  remdesivir 100 mg in sodium chloride 0.9 % 100 mL IVPB        100 mg 200 mL/hr over 30 Minutes Intravenous Daily 12/20/2019 1533 12/14/19 0907   12/09/2019 1545  remdesivir 100 mg in sodium chloride 0.9 % 100 mL IVPB        100 mg 200 mL/hr over 30 Minutes Intravenous Every 30 min 12/28/2019 1533 12/09/2019 1850   12/13/2019 1530  remdesivir 200 mg in sodium chloride 0.9% 250 mL IVPB  Status:  Discontinued       "Followed by" Linked Group Details   200 mg 580 mL/hr over 30 Minutes Intravenous Once 12/25/2019 1529 12/15/2019 1532      Inpatient Medications  Scheduled Meds: . (feeding supplement) PROSource Plus  30 mL Oral BID BM  . apixaban  5 mg Oral BID  . Chlorhexidine Gluconate Cloth  6 each Topical Daily  . diltiazem  120 mg Oral Daily  . feeding supplement (GLUCERNA SHAKE)  237 mL Oral TID BM  . fluticasone  2 spray Each Nare Daily  . insulin aspart  0-20 Units Subcutaneous TID WC  . insulin aspart  0-5 Units Subcutaneous QHS  . insulin detemir  15 Units Subcutaneous q morning - 10a  . Ipratropium-Albuterol  1 puff Inhalation Q6H  . linagliptin  5 mg Oral Daily  . loratadine  10 mg Oral Daily  . mouth rinse  15 mL Mouth Rinse BID  . methylPREDNISolone (SOLU-MEDROL) injection  40 mg Intravenous Q12H  . metoprolol tartrate  50 mg Oral BID  . sodium chloride flush  10-40 mL Intracatheter Q12H  . thyroid  30 mg Oral QAC breakfast  . Vitamin D (Ergocalciferol)  50,000 Units Oral Q7 days   Continuous Infusions: . sodium chloride 250 mL (12/12/19 0851)  . ceFEPime (MAXIPIME) IV 2 g (12/27/19 1310)   PRN Meds:.sodium chloride, acetaminophen, ALPRAZolam, chlorpheniramine-HYDROcodone, diphenhydrAMINE,  guaiFENesin-dextromethorphan, lip balm, metoprolol tartrate, oxymetazoline, pantoprazole, sodium chloride, sodium chloride flush   See all Orders from today for further details   Jeoffrey Massed M.D on 12/27/2019 at 2:02 PM  To page go to [email or web address] - use universal password  [facility] -  Office  [phone]    Objective:   Vitals:   12/27/19 0029 12/27/19 0450 12/27/19 0817 12/27/19 1144  BP: 99/70 113/75 101/71 129/84  Pulse: 88 95 92 64  Resp: 20 20 18  (!) 21  Temp: 97.8 F (36.6 C) 98 F (36.7 C) 98.9 F (37.2 C) 98.3 F (36.8 C)  TempSrc: Axillary Axillary Axillary Oral  SpO2: 92% (!) 87% (!) 88% 90%  Weight: 113.3 kg 113.4 kg    Height:        Wt Readings from Last 3 Encounters:  12/27/19 113.4 kg  10/25/16 121.1 kg  08/23/16 126.6 kg     Intake/Output Summary (Last 24 hours) at 12/27/2019 1402 Last data filed at 12/27/2019 1310 Gross per 24 hour  Intake 1028.99 ml  Output 200 ml  Net 828.99 ml     Physical Exam Gen Exam:Alert awake-not in any distress HEENT:atraumatic, normocephalic Chest: B/L clear to auscultation anteriorly CVS:S1S2 regular Abdomen:soft non tender, non distended Extremities:no edema Neurology: Non  focal Skin: no rash   Data Review:    CBC Recent Labs  Lab 12/23/19 2301 12/24/19 0404 12/25/19 0106 12/26/19 0500 12/27/19 0922  WBC 13.2* 14.1* 13.2* 16.7* 19.9*  HGB 14.6 14.7 15.0 15.1* 14.1  HCT 43.9 45.1 45.9 45.9 44.2  PLT 323 308 324 314 281  MCV 90.7 90.4 92.4 92.5 92.7  MCH 30.2 29.5 30.2 30.4 29.6  MCHC 33.3 32.6 32.7 32.9 31.9  RDW 12.9 13.0 13.0 13.2 13.2    Chemistries  Recent Labs  Lab 12/23/19 2301 12/24/19 0404 12/25/19 0106 12/26/19 0500 12/27/19 0922  NA 137 141 138 138 136  K 4.1 3.7 3.9 3.5 4.0  CL 100 104 101 99 97*  CO2 26 29 28 31 27   GLUCOSE 212* 111* 134* 87 274*  BUN 28* 24* 27* 24* 19  CREATININE 0.81 0.62 0.78 0.67 0.76  CALCIUM 8.1* 8.2* 8.3* 8.5* 8.0*  AST 27 25 28  26 31   ALT 57* 55* 54* 48* 45*  ALKPHOS 69 68 72 75 79  BILITOT 0.6 0.5 0.5 0.7 0.7   ------------------------------------------------------------------------------------------------------------------ No results for input(s): CHOL, HDL, LDLCALC, TRIG, CHOLHDL, LDLDIRECT in the last 72 hours.  Lab Results  Component Value Date   HGBA1C 10.8 (H) 12/23/2019   ------------------------------------------------------------------------------------------------------------------ No results for input(s): TSH, T4TOTAL, T3FREE, THYROIDAB in the last 72 hours.  Invalid input(s): FREET3 ------------------------------------------------------------------------------------------------------------------ No results for input(s): VITAMINB12, FOLATE, FERRITIN, TIBC, IRON, RETICCTPCT in the last 72 hours.  Coagulation profile No results for input(s): INR, PROTIME in the last 168 hours.  Recent Labs    12/27/19 0922  DDIMER 1.99*    Cardiac Enzymes No results for input(s): CKMB, TROPONINI, MYOGLOBIN in the last 168 hours.  Invalid input(s): CK ------------------------------------------------------------------------------------------------------------------    Component Value Date/Time   BNP 20.9 12/15/2019 0555    Micro Results Recent Results (from the past 240 hour(s))  MRSA PCR Screening     Status: None   Collection Time: 12/20/19 11:22 PM   Specimen: Nasal Mucosa; Nasopharyngeal  Result Value Ref Range Status   MRSA by PCR NEGATIVE NEGATIVE Final    Comment:        The GeneXpert MRSA Assay (FDA approved for NASAL specimens only), is one component of a comprehensive MRSA colonization surveillance program. It is not intended to diagnose MRSA infection nor to guide or monitor treatment for MRSA infections. Performed at [facility], [address], [place], [place] [address]     Radiology Reports DG Chest [place] 1 View  Result Date: 12/26/2019 CLINICAL DATA:  Shortness of  breath. EXAM: PORTABLE CHEST 1 VIEW COMPARISON:  12/25/2019 FINDINGS: Right-sided PICC line has tip over the SVC. Lungs are hypoinflated demonstrate persistent hazy airspace process over the mid to lower lungs bilaterally without significant change. No effusion. Cardiomediastinal silhouette and remainder of the exam is unchanged. IMPRESSION: Persistent hazy airspace process over the mid to lower lungs bilaterally without significant change compatible with multifocal viral pneumonia in this COVID-19 positive patient. Electronically Signed   By: Elberta Fortis M.D.   On: 12/26/2019 09:01   DG Chest Port 1 View  Result Date: 12/25/2019 CLINICAL DATA:  COVID, dyspnea EXAM: PORTABLE CHEST 1 VIEW COMPARISON:  Chest radiograph from one day prior. FINDINGS: Low lung volumes. Right PICC terminates over the cavoatrial junction. Stable cardiomediastinal silhouette with normal heart size. No pneumothorax. No pleural effusion. Extensive patchy hazy opacities throughout both lungs, most prominent in the lower lungs, unchanged. IMPRESSION: Stable extensive patchy hazy  opacities throughout both lungs, most prominent in the lower lungs, compatible with COVID-19 pneumonia. Electronically Signed   By: Delbert Phenix M.D.   On: 12/25/2019 11:19   DG CHEST PORT 1 VIEW  Result Date: 12/23/2019 CLINICAL DATA:  PICC line placement EXAM: PORTABLE CHEST 1 VIEW COMPARISON:  12/23/2019 FINDINGS: Right upper extremity PICC line has been placed with its tip at the expected superior cavoatrial junction. Lung volumes are small and superimposed asymmetric airspace infiltrates are again identified, more severe within the left lung base, likely infectious or inflammatory in nature. There has, however, developed linear lucencies within the mediastinum and left hilum likely representing at least mild pneumomediastinum. Additionally, lucencies have developed at the lung apices bilaterally either representing tiny bilateral pneumothoraces, or, more  likely, gas tracking within the subpleural space at the thoracic inlet given associated pneumomediastinum. Cardiac size within normal limits.  No acute bone abnormality. IMPRESSION: 1. Right upper extremity PICC line with its tip at the expected superior cavoatrial junction. 2. Interval development of pneumomediastinum. 3. Lucencies at the lung apices bilaterally either representing tiny bilateral pneumothoraces, or, more likely,, gas tracking within the subpleural space at the thoracic inlet. This could be confirmed with CT imaging or reassessed with short-term follow-up radiographs. Electronically Signed   By: Helyn Numbers MD   On: 12/23/2019 18:39   DG Chest Port 1 View  Result Date: 12/23/2019 CLINICAL DATA:  Shortness of breath EXAM: PORTABLE CHEST 1 VIEW COMPARISON:  December 14, 2019 FINDINGS: There is airspace opacity throughout both mid and lower lung regions, similar to prior study. No consolidation. Heart is borderline enlarged with pulmonary vascularity normal. No adenopathy. No bone lesions. IMPRESSION: Airspace opacity throughout both mid and lower lung zones, similar to prior study, likely due to atypical organism pneumonia. No consolidation. Stable cardiac silhouette. No evident adenopathy. Electronically Signed   By: Bretta Bang III M.D.   On: 12/23/2019 08:21   DG Chest Port 1 View  Result Date: 12/14/2019 CLINICAL DATA:  Shortness of breath, COVID. EXAM: PORTABLE CHEST 1 VIEW COMPARISON:  Prior chest radiographs 12/28/2019 and earlier. FINDINGS: Shallow inspiration radiograph. The cardiomediastinal silhouette is unchanged. Similar to the prior examination of 12/16/2019, there are interstitial and ill-defined airspace opacities within the left greater than right mid to lower lung fields. No definite pleural effusion. No evidence of pneumothorax. No acute bony abnormality identified. IMPRESSION: Shallow inspiration radiograph. Unchanged appearance of interstitial and ill-defined  airspace opacities within the left greater than right mid-to-lower lung fields. These findings likely reflect multifocal pneumonia given the provided history of COVID positivity. Electronically Signed   By: Jackey Loge DO   On: 12/14/2019 08:20   DG Chest Port 1 View  Result Date: 01/06/2020 CLINICAL DATA:  COVID positive EXAM: PORTABLE CHEST 1 VIEW COMPARISON:  04/19/2019 chest radiograph and prior. FINDINGS: Hypoinflated lungs. Bilateral mid to lower lung patchy opacities, new since prior exam. No pneumothorax. Trace bilateral effusions. Cardiomediastinal silhouette within normal limits. IMPRESSION: New bilateral mid to lower lung patchy opacities. Hypoinflated lungs. Electronically Signed   By: Stana Bunting M.D.   On: 01/06/2020 13:57   DG Chest Port 1V same Day  Result Date: 12/27/2019 CLINICAL DATA:  Shortness of breath. EXAM: PORTABLE CHEST 1 VIEW COMPARISON:  Chest radiograph 12/26/2019 FINDINGS: Stable cardiomediastinal contours. Redemonstrated right upper extremity PICC. Low lung volumes. Persistent streaky infiltrates in the bilateral mid to lower lungs, similar to prior. No new focal consolidation. No evidence of pneumothorax or large pleural effusion. IMPRESSION: Persistent  streaky infiltrates in the bilateral mid to lower lungs, similar to prior. Electronically Signed   By: Emmaline Kluver M.D.   On: 12/27/2019 08:22   DG Chest Port 1V same Day  Result Date: 12/24/2019 CLINICAL DATA:  COVID-19 positive. Short of breath. Follow-up exam. EXAM: PORTABLE CHEST 1 VIEW COMPARISON:  12/23/2019. FINDINGS: Small left apical pneumothorax is similar to the previous day's exam. No visualized right pneumothorax on the current study. Pneumo mediastinum is stable. Hazy bilateral airspace lung opacities are unchanged. No new lung abnormalities. Right PICC has its tip in the lower superior vena cava, also unchanged IMPRESSION: 1. Tiny left apical pneumothorax appears similar to the previous day's  exam. 2. No current evidence of a right pneumothorax. 3. Pneumo mediastinum 4. Stable well-positioned right PICC. 5. No change in bilateral hazy airspace lung opacities. Electronically Signed   By: Amie Portland M.D.   On: 12/24/2019 16:31   ECHOCARDIOGRAM LIMITED  Result Date: 12/16/2019    ECHOCARDIOGRAM LIMITED REPORT   Patient Name:   Linda Richmond Date of Exam: 12/16/2019 Medical Rec #:  [id number]     Height:       62.0 in Accession #:    [id number]    Weight:       262.0 lb Date of Birth:  1965-04-20      BSA:          2.144 m Patient Age:    54 years      BP:           109/73 mmHg Patient Gender: F             HR:           131 bpm. Exam Location:  Inpatient Procedure: Limited Echo, Limited Color Doppler, Cardiac Doppler and Intracardiac            Opacification Agent Indications:    Acute Hypoxic Respiratory Failure  History:        Patient has prior history of Echocardiogram examinations, most                 recent 09/06/2016. Risk Factors:Hypertension and Diabetes. GERD.  Sonographer:    [name] RDCS Referring Phys: [id number] Deno Lunger [facility] IMPRESSIONS  1. Left ventricular ejection fraction, by estimation, is 60 to 65%. The left ventricle has normal function. The left ventricle has no regional wall motion abnormalities. There is mild left ventricular hypertrophy. Left ventricular diastolic function could not be evaluated.  2. The mitral valve is grossly normal.  3. The aortic valve is tricuspid. Aortic valve regurgitation is not visualized. No aortic stenosis is present. Comparison(s): Prior images unable to be directly viewed, comparison made by report only. Conclusion(s)/Recommendation(s): Otherwise normal echocardiogram, with minor abnormalities described in the report. FINDINGS  Left Ventricle: Left ventricular ejection fraction, by estimation, is 60 to 65%. The left ventricle has normal function. The left ventricle has no regional wall motion abnormalities. Definity contrast agent was given IV  to delineate the left ventricular  endocardial borders. There is mild left ventricular hypertrophy. Left ventricular diastolic function could not be evaluated. Left ventricular diastolic function could not be evaluated due to atrial fibrillation. Left Atrium: Left atrial size was normal in size. Right Atrium: Right atrial size was not well visualized. Pericardium: Trivial pericardial effusion is present. Mitral Valve: The mitral valve is grossly normal. Tricuspid Valve: The tricuspid valve is normal in structure. Tricuspid valve regurgitation is trivial. No evidence of tricuspid stenosis. Aortic Valve: The  aortic valve is tricuspid. Aortic valve regurgitation is not visualized. No aortic stenosis is present. Pulmonic Valve: The pulmonic valve was not well visualized. Pulmonic valve regurgitation is not visualized. Aorta: The aortic root is normal in size and structure. Venous: The inferior vena cava was not well visualized. IAS/Shunts: The atrial septum is grossly normal. LEFT VENTRICLE PLAX 2D LVIDd:         4.50 cm LVIDs:         2.80 cm LV PW:         1.10 cm LV IVS:        1.20 cm LVOT diam:     1.80 cm LV SV:         28 LV SV Index:   13 LVOT Area:     2.54 cm  LV Volumes (MOD) LV vol d, MOD A2C: 113.0 ml LV vol d, MOD A4C: 118.0 ml LV vol s, MOD A2C: 53.6 ml LV vol s, MOD A4C: 41.4 ml LV SV MOD A2C:     59.4 ml LV SV MOD A4C:     118.0 ml LV SV MOD BP:      71.7 ml LEFT ATRIUM             Index LA diam:        3.90 cm 1.82 cm/m LA Vol (A2C):   34.2 ml 15.95 ml/m LA Vol (A4C):   23.4 ml 10.91 ml/m LA Biplane Vol: 29.0 ml 13.53 ml/m  AORTIC VALVE LVOT Vmax:   83.60 cm/s LVOT Vmean:  49.400 cm/s LVOT VTI:    0.110 m  AORTA Ao Root diam: 2.90 cm MITRAL VALVE MV Area (PHT): 4.09 cm    SHUNTS MV Decel Time: 186 msec    Systemic VTI:  0.11 m MV E velocity: 72.40 cm/s  Systemic Diam: 1.80 cm Jodelle Red MD Electronically signed by Jodelle Red MD Signature Date/Time: 12/16/2019/9:51:17 PM     Final    [place] EKG Site Rite  Result Date: 12/23/2019 If Site Rite image not attached, placement could not be confirmed due to current cardiac rhythm.

## 2019-12-27 NOTE — Progress Notes (Addendum)
Physical Therapy Treatment Patient Details Name: Linda Richmond MRN: [id number] DOB: 1965/12/26 Today's Date: 12/27/2019    History of Present Illness Pt is a 54 y.o. female who tested (+) COVID-19 on 12/03/19, now admitted to [facility] 2019-12-20 with acute hypoxic respiratory failure due to covid PNA. Transferred to [facility] on 12/13/19 due to worsening hypoxemia. Afib with RVR 9/8. On 9/16 Pneumomediastinum, tiny left apical pneumothorax; 9/18 no pneumothorax. PMH includes DM, RA, morbid obesity.    PT Comments    Pt remains limited by hypoxia.  She was on non rebreather and 15 L HFNC.  Her sats were 82% at rest and dropped as low as 47% (with good pleth) just with stand pivot to bsc taking several minutes to recover.  Notified RN.  Pt coached on breathing technique and relaxation.     Follow Up Recommendations  No PT follow up (pending progression)     Equipment Recommendations  None recommended by PT    Recommendations for Other Services       Precautions / Restrictions Precautions Precautions: Other (comment) Precaution Comments: Watch SATS Restrictions Weight Bearing Restrictions: No    Mobility  Bed Mobility               General bed mobility comments: up in chair, returned to chair  Transfers Overall transfer level: Modified independent Equipment used: None Transfers: Sit to/from [employer] Sit to Stand: Min guard Stand pivot transfers: Min guard       General transfer comment: Stand pivot to and from bsc only due to low sats; PT assisted with ADLs  Ambulation/Gait                 Stairs             Wheelchair Mobility    Modified Rankin (Stroke Patients Only)       Balance Overall balance assessment: No apparent balance deficits (not formally assessed)                                          Cognition Arousal/Alertness: Awake/alert Behavior During Therapy: WFL for tasks assessed/performed;Anxious Overall  Cognitive Status: Within Functional Limits for tasks assessed                                        Exercises      General Comments General comments (skin integrity, edema, etc.): At arrival pt on 15 L HFNC and non rebreather.  Sats wer 82-85% at rest.  Pt needed to use BSC.  Assisted to from [facility] via stand pivot.  Sats down to low 70% during toielting and transfer back.  During recovery dropped as low as 47%, spent 1-2 mins 50%, 1-2 mins 60%, and 3-4 mins 70% before coming back to 82%.  Called for nursing when sats dropped but no one available.  Notified Greata, RN of sats after session and pt stabilized.  During treatment pt coached on breathing techniques and posture to promote full lung use.  Additionally, had good pleth with all O2 readings.      Pertinent Vitals/Pain Pain Assessment: No/denies pain    Home Living                      Prior Function  PT Goals (current goals can now be found in the care plan section) Acute Rehab PT Goals Patient Stated Goal: return home PT Goal Formulation: With patient Time For Goal Achievement: 12/28/19 Potential to Achieve Goals: Good Progress towards PT goals: Not progressing toward goals - comment (limited by hypoxia)    Frequency           PT Plan Current plan remains appropriate    Co-evaluation              AM-PAC PT "6 Clicks" Mobility   Outcome Measure  Help needed turning from your back to your side while in a flat bed without using bedrails?: None Help needed moving from lying on your back to sitting on the side of a flat bed without using bedrails?: A Little Help needed moving to and from a bed to a chair (including a wheelchair)?: None Help needed standing up from a chair using your arms (e.g., wheelchair or bedside chair)?: None Help needed to walk in hospital room?: A Little Help needed climbing 3-5 steps with a railing? : A Little 6 Click Score: 21    End of Session  Equipment Utilized During Treatment: Oxygen Activity Tolerance: Patient limited by fatigue;Treatment limited secondary to medical complications (Comment) Patient left: in chair;with call bell/phone within reach Nurse Communication: Mobility status;Other (comment) (O2 sats) PT Visit Diagnosis: Other abnormalities of gait and mobility (R26.89)     Time: 1032-1050 PT Time Calculation (min) (ACUTE ONLY): 18 min  Charges:  $Therapeutic Activity: 8-22 mins                     Anise Salvo, PT Acute Rehab Services Pager [phone] [facility] [phone]     Rayetta Humphrey 12/27/2019, 11:08 AM

## 2019-12-28 ENCOUNTER — Inpatient Hospital Stay (HOSPITAL_COMMUNITY): Payer: 59

## 2019-12-28 DIAGNOSIS — U071 COVID-19: Secondary | ICD-10-CM

## 2019-12-28 DIAGNOSIS — J80 Acute respiratory distress syndrome: Secondary | ICD-10-CM

## 2019-12-28 LAB — C-REACTIVE PROTEIN: CRP: 26.4 mg/dL — ABNORMAL HIGH (ref <1.0)

## 2019-12-28 LAB — CBC
HCT: 43.9 % (ref 36.0–46.0)
Hemoglobin: 14.2 g/dL (ref 12.0–15.0)
MCH: 29.5 pg (ref 26.0–34.0)
MCHC: 32.3 g/dL (ref 30.0–36.0)
MCV: 91.1 fL (ref 80.0–100.0)
Platelets: 247 10*3/uL (ref 150–400)
RBC: 4.82 MIL/uL (ref 3.87–5.11)
RDW: 13.1 % (ref 11.5–15.5)
WBC: 16.2 10*3/uL — ABNORMAL HIGH (ref 4.0–10.5)
nRBC: 0 % (ref 0.0–0.2)

## 2019-12-28 LAB — COMPREHENSIVE METABOLIC PANEL
ALT: 57 U/L — ABNORMAL HIGH (ref 0–44)
AST: 33 U/L (ref 15–41)
Albumin: 2.3 g/dL — ABNORMAL LOW (ref 3.5–5.0)
Alkaline Phosphatase: 86 U/L (ref 38–126)
Anion gap: 13 (ref 5–15)
BUN: 23 mg/dL — ABNORMAL HIGH (ref 6–20)
CO2: 27 mmol/L (ref 22–32)
Calcium: 8.6 mg/dL — ABNORMAL LOW (ref 8.9–10.3)
Chloride: 100 mmol/L (ref 98–111)
Creatinine, Ser: 0.73 mg/dL (ref 0.44–1.00)
GFR calc Af Amer: >60 mL/min (ref >60)
GFR calc non Af Amer: >60 mL/min (ref >60)
Glucose, Bld: 240 mg/dL — ABNORMAL HIGH (ref 70–99)
Potassium: 4.3 mmol/L (ref 3.5–5.1)
Sodium: 140 mmol/L (ref 135–145)
Total Bilirubin: 0.9 mg/dL (ref 0.3–1.2)
Total Protein: 6 g/dL — ABNORMAL LOW (ref 6.5–8.1)

## 2019-12-28 LAB — GLUCOSE, CAPILLARY
Glucose-Capillary: 237 mg/dL — ABNORMAL HIGH (ref 70–99)
Glucose-Capillary: 229 mg/dL — ABNORMAL HIGH (ref 70–99)
Glucose-Capillary: 240 mg/dL — ABNORMAL HIGH (ref 70–99)
Glucose-Capillary: 241 mg/dL — ABNORMAL HIGH (ref 70–99)
Glucose-Capillary: 269 mg/dL — ABNORMAL HIGH (ref 70–99)
Glucose-Capillary: 220 mg/dL — ABNORMAL HIGH (ref 70–99)

## 2019-12-28 LAB — D-DIMER, QUANTITATIVE: D-Dimer, Quant: 1.58 ug/mL-FEU — ABNORMAL HIGH (ref 0.00–0.50)

## 2019-12-28 MED ORDER — INSULIN DETEMIR 100 UNIT/ML ~~LOC~~ SOLN
20.0000 [IU] | Freq: Two times a day (BID) | SUBCUTANEOUS | Status: DC
  Administered 2019-12-28 – 2019-12-31 (×7): 20 [IU] via SUBCUTANEOUS
  Filled 2019-12-28 (×10): qty 0.2

## 2019-12-28 MED ORDER — INSULIN ASPART 100 UNIT/ML ~~LOC~~ SOLN
10.0000 [IU] | Freq: Three times a day (TID) | SUBCUTANEOUS | Status: DC
  Administered 2019-12-28 (×2): 10 [IU] via SUBCUTANEOUS

## 2019-12-28 MED ORDER — METHYLPREDNISOLONE SODIUM SUCC 125 MG IJ SOLR
60.0000 mg | Freq: Two times a day (BID) | INTRAMUSCULAR | Status: DC
  Administered 2019-12-28 – 2019-12-31 (×8): 60 mg via INTRAVENOUS
  Filled 2019-12-28 (×8): qty 2

## 2019-12-28 MED ORDER — ENOXAPARIN SODIUM 120 MG/0.8ML ~~LOC~~ SOLN
115.0000 mg | Freq: Two times a day (BID) | SUBCUTANEOUS | Status: DC
  Administered 2019-12-28 – 2019-12-31 (×6): 115 mg via SUBCUTANEOUS
  Filled 2019-12-28 (×6): qty 0.76

## 2019-12-28 MED ORDER — INSULIN ASPART 100 UNIT/ML ~~LOC~~ SOLN
0.0000 [IU] | SUBCUTANEOUS | Status: DC
  Administered 2019-12-28: 11 [IU] via SUBCUTANEOUS
  Administered 2019-12-28: 7 [IU] via SUBCUTANEOUS
  Administered 2019-12-29: 11 [IU] via SUBCUTANEOUS
  Administered 2019-12-29: 4 [IU] via SUBCUTANEOUS
  Administered 2019-12-29: 11 [IU] via SUBCUTANEOUS
  Administered 2019-12-29: 7 [IU] via SUBCUTANEOUS
  Administered 2019-12-29: 4 [IU] via SUBCUTANEOUS
  Administered 2019-12-29: 7 [IU] via SUBCUTANEOUS
  Administered 2019-12-29 – 2019-12-30 (×2): 4 [IU] via SUBCUTANEOUS
  Administered 2019-12-30: 7 [IU] via SUBCUTANEOUS
  Administered 2019-12-30: 4 [IU] via SUBCUTANEOUS
  Administered 2019-12-30: 11 [IU] via SUBCUTANEOUS
  Administered 2019-12-31: 7 [IU] via SUBCUTANEOUS
  Administered 2019-12-31 (×2): 11 [IU] via SUBCUTANEOUS
  Administered 2019-12-31 (×4): 7 [IU] via SUBCUTANEOUS
  Administered 2020-01-01: 11 [IU] via SUBCUTANEOUS
  Administered 2020-01-01: 7 [IU] via SUBCUTANEOUS
  Administered 2020-01-01 (×3): 11 [IU] via SUBCUTANEOUS
  Administered 2020-01-02: 20 [IU] via SUBCUTANEOUS
  Administered 2020-01-02: 4 [IU] via SUBCUTANEOUS
  Administered 2020-01-02: 11 [IU] via SUBCUTANEOUS
  Administered 2020-01-02: 4 [IU] via SUBCUTANEOUS
  Administered 2020-01-03: 11 [IU] via SUBCUTANEOUS
  Administered 2020-01-03: 15 [IU] via SUBCUTANEOUS
  Administered 2020-01-03: 20 [IU] via SUBCUTANEOUS
  Administered 2020-01-03: 15 [IU] via SUBCUTANEOUS
  Administered 2020-01-03 (×2): 20 [IU] via SUBCUTANEOUS

## 2019-12-28 MED ORDER — PANTOPRAZOLE SODIUM 40 MG PO TBEC: 40.0000 mg | DELAYED_RELEASE_TABLET | Freq: Every day | ORAL | Status: DC

## 2019-12-28 MED ORDER — FUROSEMIDE 10 MG/ML IJ SOLN
40.0000 mg | Freq: Once | INTRAMUSCULAR | Status: AC
  Administered 2019-12-28: 40 mg via INTRAVENOUS
  Filled 2019-12-28: qty 4

## 2019-12-28 MED ORDER — FUROSEMIDE 10 MG/ML IJ SOLN
60.0000 mg | Freq: Once | INTRAMUSCULAR | Status: AC
  Administered 2019-12-28: 60 mg via INTRAVENOUS
  Filled 2019-12-28: qty 6

## 2019-12-28 NOTE — Progress Notes (Addendum)
Patient on HHFNC 40 L and NRB with Sat O2 in high 70s low 80s. Per MD, oxygen was increased to 50 L HHFNC and sat O2 continue to be in low 80s and patient is not tolerated higher amount of oxygen. MD made aware.

## 2019-12-28 NOTE — Progress Notes (Addendum)
Informed by RN-patient unable to tolerate high flow beyond 50 L-and when titrated back down to 40 L of heated high flow-O2 saturations have been hovering in the low 80s/high 70s.  She appears comfortable.  Given trajectory of worsening hypoxemia-we will go ahead and consult PCCM-suspect she may need BiPAP/heated high flow combination and further close monitoring in the ICU.

## 2019-12-28 NOTE — Progress Notes (Signed)
ANTICOAGULATION CONSULT NOTE - Follow Up Consult  Pharmacy Consult for Apixaban to Lovenox  Indication: atrial fibrillation  Allergies  Allergen Reactions  . Ancef [Cefazolin] Hives  . Aspirin Other (See Comments)    GI Bleed  . Doxycycline Diarrhea and Nausea And Vomiting  . Sulfa Antibiotics Hives    Possible loss of consciousness  . Ciprofloxacin Rash    "feels like bugs are crawling in my head"  . Lesinurad-Allopurinol Nausea And Vomiting  . Penicillins Rash    Patient Measurements: Height: 5\' 2"  (157.5 cm) Weight: 115 kg (253 lb 8.5 oz) IBW/kg (Calculated) : 50.1  Vital Signs: Temp: 97.9 F (36.6 C) (09/21 0753) Temp Source: Axillary (09/21 0753) BP: 131/86 (09/21 0753) Pulse Rate: 85 (09/21 0753)  Labs: Recent Labs    12/26/19 0500 12/26/19 0500 12/27/19 0922 12/28/19 0402  HGB 15.1*   < > 14.1 14.2  HCT 45.9  --  44.2 43.9  PLT 314  --  281 247  CREATININE 0.67  --  0.76 0.73   < > = values in this interval not displayed.    Estimated Creatinine Clearance: 94.3 mL/min (by C-G formula based on SCr of 0.73 mg/dL).  Assessment: 54 year old female with COVID currently on apixaban for Afib transitioning back to Lovenox, last dose of apixaban at 7:30 am  Goal of Therapy:  Anti-Xa level 0.6-1 units/ml 4hrs after LMWH dose given Monitor platelets by anticoagulation protocol: Yes   Plan:  Lovenox 115 mg sq Q 12 hours starting tonight Follow CBC  Thank you 59, PharmD  Okey Regal 12/28/2019,11:28 AM

## 2019-12-28 NOTE — Progress Notes (Signed)
Pt moved from bed to chair approximate 2 feet. Pt on 15L HFNC and 15L NRB. Pt oxygen dropped to low 50's. After 5-7 minutes patient's oxygen improved to 88%. Pt expresses no complaints at this time. Pt resting in chair. Will continue to monitor.

## 2019-12-28 NOTE — Progress Notes (Signed)
PROGRESS NOTE                                                                                                                                                                                                             Patient Demographics:    Linda Richmond, is a 54 y.o. female, DOB - [birth year], UJW:[id number]  Outpatient Primary MD for the patient is Ignatius Specking, MD   Admit date - 12/25/2019   LOS - 18  Chief Complaint  Patient presents with  . Shortness of Breath       Brief Narrative: Patient is a 54 y.o. female with PMHx of anxiety, depression, DM-2, HTN, seronegative RA-who tested positive for COVID-19 on 8/27-admitted to [facility] on 9/3 with acute hypoxemic respiratory failure secondary to COVID-19 pneumonia.  Unfortunately hospital course complicated by worsening hypoxemia requiring HFNC.  Transferred to [facility] for further evaluation and treatment.  See below for further details  COVID-19 vaccinated status: Unvaccinated  Significant Events: 8/27>> COVID-19 positive 9/3>> Admit to [facility] for hypoxemia secondary to COVID-19 9/6>> transferred to [facility]-worsening hypoxemia on 15 L of HFNC 9/8>> A. fib with RVR 9/21>> transitioned to heated high flow with NRB.  Significant studies: 9/3>>Chest x-ray: New bilateral mid to lower lung patchy opacities 9/9>> Echo: EF 60-65% 9/16>> chest x-ray: Airspace opacity throughout mid/lower lungs 9/16>> chest x-ray: Interval development of pneumomediastinum 9/17>> chest x-ray: Pneumomediastinum, tiny left apical pneumothorax-no change in bilateral airspace opacities 9/18>> chest x-ray: Stable extensive patchy hazy opacities-no pneumothorax. 9/19>> chest x-ray: Persistent airspace opacities over mid to lower lungs bilaterally. 9/20>> chest x-ray: Persistent streaky infiltrates to bilateral lungs.  COVID-19 medications: Steroids: 9/3>> Remdesivir: 9/3>>  9/7 Baricitinib: 9/5>>9/18  Antibiotics: Cefepime:9/19>>  Microbiology data: 9/20 >>blood culture: 1/2-Staph epidermidis (likely contaminant)  Procedures: 9/16>> PICC line placement  Consults: Cardiology  DVT prophylaxis: SCDs Start: 12/14/2019 1527 Eliquis>>Lovenox therapeutic    Subjective:   Hypoxemia has gradually worsened-she was not able to maintain O2 saturations this morning on 15 L of HFNC and NRB-subsequently transitioned to heated high flow and NRB.  Appears comfortable at rest-but per nursing staff-desaturates down to the 60s with minimal movement.   Assessment  & Plan :   Acute Hypoxic Resp Failure due to Covid 19 Viral pneumonia: Continues to have severe hypoxemia-has been on 15 L of HFNC  and NRB for more than a week-this morning O2 saturation mostly in the low 80s with HFNC/NRB-hence transitioned to heated high flow 40 L with 100% FiO2 along with NRB-sats are now mostly in the mid to high 80s.  Repeat chest x-ray today-repeat dose of IV Lasix.  CRP continues to trend up in spite of being on steroids since admission-increase Solu-Medrol to 60 mg twice daily (had been transitioned to prednisone)-given tenuous clinical situation-on empiric cefepime x5 days since 9/19.  Given clinical trajectory-and worsening hypoxemia-we will need to watch closely-and if her oxygen requirements continue to worsen-she will likely need to be transferred to the ICU.  Discussed at length with RN staff.    Fever: afebrile O2 requirements:  SpO2: (!) 86 % O2 Flow Rate (L/min): 50 L/min FiO2 (%): 100 %   COVID-19 Labs: Recent Labs    12/26/19 0500 12/27/19 0354 12/27/19 0922 12/28/19 0402  DDIMER  --   --  1.99* 1.58*  CRP 11.2* 12.5*  --  26.4*       Component Value Date/Time   BNP 20.9 12/15/2019 0555    Recent Labs  Lab 12/25/19 0106  PROCALCITON <0.10    Lab Results  Component Value Date   SARSCOV2NAA POSITIVE (A) 2020/01/07   SARSCOV2NAA Not Detected 04/13/2019    SARSCOV2NAA Not Detected 02/02/2019   SARSCOV2NAA Not Detected 01/22/2019      Prone/Incentive Spirometry: encouraged patient to lie prone for 3-4 hours at a time for a total of 16 hours a day, and to encourage incentive spirometry use 3-4/hour.  Pneumomediastinum with small left apical pneumothorax: Likely sequelae of severe parenchymal injury.  Most recent chest x-ray is without any obvious pneumothorax.  Await repeat chest x-ray this morning.  Epistaxis: Resolved-continue supportive care.  A. fib with RVR: Maintaining sinus rhythm-continue stable on Cardizem/Lopressor-was on Eliquis but will go and switch to Lovenox given worsening hypoxemia and slowly increase in D-dimer.  DM-2 (A1c 10.8) with steroid induced hyperglycemia: CBGs continue to be on the higher side-Levemir dose adjusted to 20 units twice daily, Premeal NovoLog increased to 10 units with meals-remains on resistant SSI.  Continue to follow and adjust.    Recent Labs    12/27/19 2352 12/28/19 0252 12/28/19 0747  GLUCAP 312* 237* 229*   HTN: BP controlled-continue metoprolol and Cardizem.  Hypothyroidism: Continue thyroid Armour-TSH within normal limits  GERD: Continue PPI  Positive blood culture-1/2+ for staph epidermidis-likely represents a contaminant-no treatment required.  Nutrition Problem: Nutrition Problem: Increased nutrient needs Etiology: acute illness (COVID-19) Signs/Symptoms: estimated needs Interventions: Glucerna shake, MVI, Premier Protein  Morbid Obesity: Estimated body mass index is 46.37 kg/m as calculated from the following:   Height as of this encounter: 5\' 2"  (1.575 m).   Weight as of this encounter: 115 kg.    ABG: No results found for: PHART, PCO2ART, PO2ART, HCO3, TCO2, ACIDBASEDEF, O2SAT  Vent Settings:  FiO2 (%):  [100 %] 100 %  Condition - Extremely Guarded   Family Communication  :  Daughter [phone]) phone on 9/21-she is aware of the tenuous clinical  situation.  Code Status :  Full Code  Diet :  Diet Order            Diet Carb Modified Fluid consistency: Thin; Room service appropriate? Yes  Diet effective now                  Disposition Plan  :   Status is: Inpatient  Remains inpatient appropriate because:Inpatient  level of care appropriate due to severity of illness  Dispo: The patient is from: Home              Anticipated d/c is to: Home              Anticipated d/c date is: > 3 days              Patient currently is not medically stable to d/c.   Barriers to discharge: Hypoxia requiring O2 supplementation  Antimicorbials  :    Anti-infectives (From admission, onward)   Start     Dose/Rate Route Frequency Ordered Stop   12/26/19 1415  ceFEPIme (MAXIPIME) 2 g in sodium chloride 0.9 % 100 mL IVPB        2 g 200 mL/hr over 30 Minutes Intravenous Every 8 hours 12/26/19 1402 12/31/19 1359   12/11/19 1000  remdesivir 100 mg in sodium chloride 0.9 % 100 mL IVPB  Status:  Discontinued       "Followed by" Linked Group Details   100 mg 200 mL/hr over 30 Minutes Intravenous Daily 12/25/2019 1529 12/12/2019 1532   12/11/19 1000  remdesivir 100 mg in sodium chloride 0.9 % 100 mL IVPB        100 mg 200 mL/hr over 30 Minutes Intravenous Daily 12/14/2019 1533 12/14/19 0907   12/29/2019 1545  remdesivir 100 mg in sodium chloride 0.9 % 100 mL IVPB        100 mg 200 mL/hr over 30 Minutes Intravenous Every 30 min 12/22/2019 1533 12/23/2019 1850   12/15/2019 1530  remdesivir 200 mg in sodium chloride 0.9% 250 mL IVPB  Status:  Discontinued       "Followed by" Linked Group Details   200 mg 580 mL/hr over 30 Minutes Intravenous Once 12/29/2019 1529 12/16/2019 1532      Inpatient Medications  Scheduled Meds: . (feeding supplement) PROSource Plus  30 mL Oral BID BM  . apixaban  5 mg Oral BID  . Chlorhexidine Gluconate Cloth  6 each Topical Daily  . diltiazem  120 mg Oral Daily  . feeding supplement (GLUCERNA SHAKE)  237 mL Oral TID BM  .  fluticasone  2 spray Each Nare Daily  . furosemide  40 mg Intravenous Once  . insulin aspart  0-20 Units Subcutaneous TID WC  . insulin aspart  0-5 Units Subcutaneous QHS  . insulin aspart  10 Units Subcutaneous TID WC  . insulin detemir  20 Units Subcutaneous BID  . Ipratropium-Albuterol  1 puff Inhalation Q6H  . linagliptin  5 mg Oral Daily  . loratadine  10 mg Oral Daily  . mouth rinse  15 mL Mouth Rinse BID  . methylPREDNISolone (SOLU-MEDROL) injection  60 mg Intravenous Q12H  . metoprolol tartrate  50 mg Oral BID  . sodium chloride flush  10-40 mL Intracatheter Q12H  . thyroid  30 mg Oral QAC breakfast  . Vitamin D (Ergocalciferol)  50,000 Units Oral Q7 days   Continuous Infusions: . sodium chloride 250 mL (12/28/19 0514)  . ceFEPime (MAXIPIME) IV 2 g (12/28/19 0518)   PRN Meds:.sodium chloride, acetaminophen, ALPRAZolam, chlorpheniramine-HYDROcodone, diphenhydrAMINE, guaiFENesin-dextromethorphan, lip balm, metoprolol tartrate, oxymetazoline, pantoprazole, sodium chloride, sodium chloride flush   See all Orders from today for further details   Jeoffrey Massed M.D on 12/28/2019 at 11:12 AM  To page go to [email or web address] - use universal password  [facility] -  Office  [phone]    Objective:   Vitals:   12/28/19 0753 12/28/19  9629 12/28/19 1015 12/28/19 1059  BP: 131/86     Pulse: 85     Resp: 20     Temp: 97.9 F (36.6 C)     TempSrc: Axillary     SpO2: (!) 81% (!) 85% (!) 85% (!) 86%  Weight:      Height:        Wt Readings from Last 3 Encounters:  12/28/19 115 kg  10/25/16 121.1 kg  08/23/16 126.6 kg     Intake/Output Summary (Last 24 hours) at 12/28/2019 1112 Last data filed at 12/28/2019 0000 Gross per 24 hour  Intake 540 ml  Output 500 ml  Net 40 ml     Physical Exam Gen Exam:Alert awake-not in any distress HEENT:atraumatic, normocephalic Chest: B/L clear to auscultation anteriorly CVS:S1S2 regular Abdomen:soft non tender, non  distended Extremities:no edema Neurology: Non focal Skin: no rash   Data Review:    CBC Recent Labs  Lab 12/24/19 0404 12/25/19 0106 12/26/19 0500 12/27/19 0922 12/28/19 0402  WBC 14.1* 13.2* 16.7* 19.9* 16.2*  HGB 14.7 15.0 15.1* 14.1 14.2  HCT 45.1 45.9 45.9 44.2 43.9  PLT 308 324 314 281 247  MCV 90.4 92.4 92.5 92.7 91.1  MCH 29.5 30.2 30.4 29.6 29.5  MCHC 32.6 32.7 32.9 31.9 32.3  RDW 13.0 13.0 13.2 13.2 13.1    Chemistries  Recent Labs  Lab 12/24/19 0404 12/25/19 0106 12/26/19 0500 12/27/19 0922 12/28/19 0402  NA 141 138 138 136 140  K 3.7 3.9 3.5 4.0 4.3  CL 104 101 99 97* 100  CO2 29 28 31 27 27   GLUCOSE 111* 134* 87 274* 240*  BUN 24* 27* 24* 19 23*  CREATININE 0.62 0.78 0.67 0.76 0.73  CALCIUM 8.2* 8.3* 8.5* 8.0* 8.6*  AST 25 28 26 31  33  ALT 55* 54* 48* 45* 57*  ALKPHOS 68 72 75 79 86  BILITOT 0.5 0.5 0.7 0.7 0.9   ------------------------------------------------------------------------------------------------------------------ No results for input(s): CHOL, HDL, LDLCALC, TRIG, CHOLHDL, LDLDIRECT in the last 72 hours.  Lab Results  Component Value Date   HGBA1C 10.8 (H) 12/27/2019   ------------------------------------------------------------------------------------------------------------------ No results for input(s): TSH, T4TOTAL, T3FREE, THYROIDAB in the last 72 hours.  Invalid input(s): FREET3 ------------------------------------------------------------------------------------------------------------------ No results for input(s): VITAMINB12, FOLATE, FERRITIN, TIBC, IRON, RETICCTPCT in the last 72 hours.  Coagulation profile No results for input(s): INR, PROTIME in the last 168 hours.  Recent Labs    12/27/19 0922 12/28/19 0402  DDIMER 1.99* 1.58*    Cardiac Enzymes No results for input(s): CKMB, TROPONINI, MYOGLOBIN in the last 168 hours.  Invalid input(s):  CK ------------------------------------------------------------------------------------------------------------------    Component Value Date/Time   BNP 20.9 12/15/2019 0555    Micro Results Recent Results (from the past 240 hour(s))  MRSA PCR Screening     Status: None   Collection Time: 12/20/19 11:22 PM   Specimen: Nasal Mucosa; Nasopharyngeal  Result Value Ref Range Status   MRSA by PCR NEGATIVE NEGATIVE Final    Comment:        The GeneXpert MRSA Assay (FDA approved for NASAL specimens only), is one component of a comprehensive MRSA colonization surveillance program. It is not intended to diagnose MRSA infection nor to guide or monitor treatment for MRSA infections. Performed at [facility], [address], [place], [place] [address]     Radiology Reports DG Chest [place] 1 View  Result Date: 12/26/2019 CLINICAL DATA:  Shortness of breath. EXAM: PORTABLE CHEST 1 VIEW COMPARISON:  12/25/2019 FINDINGS: Right-sided PICC line has tip over the SVC. Lungs are hypoinflated demonstrate persistent hazy airspace process over the mid to lower lungs bilaterally without significant change. No effusion. Cardiomediastinal silhouette and remainder of the exam is unchanged. IMPRESSION: Persistent hazy airspace process over the mid to lower lungs bilaterally without significant change compatible with multifocal viral pneumonia in this COVID-19 positive patient. Electronically Signed   By: Elberta Fortis M.D.   On: 12/26/2019 09:01   DG Chest Port 1 View  Result Date: 12/25/2019 CLINICAL DATA:  COVID, dyspnea EXAM: PORTABLE CHEST 1 VIEW COMPARISON:  Chest radiograph from one day prior. FINDINGS: Low lung volumes. Right PICC terminates over the cavoatrial junction. Stable cardiomediastinal silhouette with normal heart size. No pneumothorax. No pleural effusion. Extensive patchy hazy opacities throughout both lungs, most prominent in the lower lungs, unchanged. IMPRESSION: Stable extensive  patchy hazy opacities throughout both lungs, most prominent in the lower lungs, compatible with COVID-19 pneumonia. Electronically Signed   By: Delbert Phenix M.D.   On: 12/25/2019 11:19   DG CHEST PORT 1 VIEW  Result Date: 12/23/2019 CLINICAL DATA:  PICC line placement EXAM: PORTABLE CHEST 1 VIEW COMPARISON:  12/23/2019 FINDINGS: Right upper extremity PICC line has been placed with its tip at the expected superior cavoatrial junction. Lung volumes are small and superimposed asymmetric airspace infiltrates are again identified, more severe within the left lung base, likely infectious or inflammatory in nature. There has, however, developed linear lucencies within the mediastinum and left hilum likely representing at least mild pneumomediastinum. Additionally, lucencies have developed at the lung apices bilaterally either representing tiny bilateral pneumothoraces, or, more likely, gas tracking within the subpleural space at the thoracic inlet given associated pneumomediastinum. Cardiac size within normal limits.  No acute bone abnormality. IMPRESSION: 1. Right upper extremity PICC line with its tip at the expected superior cavoatrial junction. 2. Interval development of pneumomediastinum. 3. Lucencies at the lung apices bilaterally either representing tiny bilateral pneumothoraces, or, more likely,, gas tracking within the subpleural space at the thoracic inlet. This could be confirmed with CT imaging or reassessed with short-term follow-up radiographs. Electronically Signed   By: Helyn Numbers MD   On: 12/23/2019 18:39   DG Chest Port 1 View  Result Date: 12/23/2019 CLINICAL DATA:  Shortness of breath EXAM: PORTABLE CHEST 1 VIEW COMPARISON:  December 14, 2019 FINDINGS: There is airspace opacity throughout both mid and lower lung regions, similar to prior study. No consolidation. Heart is borderline enlarged with pulmonary vascularity normal. No adenopathy. No bone lesions. IMPRESSION: Airspace opacity  throughout both mid and lower lung zones, similar to prior study, likely due to atypical organism pneumonia. No consolidation. Stable cardiac silhouette. No evident adenopathy. Electronically Signed   By: Bretta Bang III M.D.   On: 12/23/2019 08:21   DG Chest Port 1 View  Result Date: 12/14/2019 CLINICAL DATA:  Shortness of breath, COVID. EXAM: PORTABLE CHEST 1 VIEW COMPARISON:  Prior chest radiographs 12/22/2019 and earlier. FINDINGS: Shallow inspiration radiograph. The cardiomediastinal silhouette is unchanged. Similar to the prior examination of 12/31/2019, there are interstitial and ill-defined airspace opacities within the left greater than right mid to lower lung fields. No definite pleural effusion. No evidence of pneumothorax. No acute bony abnormality identified. IMPRESSION: Shallow inspiration radiograph. Unchanged appearance of interstitial and ill-defined airspace opacities within the left greater than right mid-to-lower lung fields. These findings likely reflect multifocal pneumonia given the provided history of COVID positivity. Electronically Signed   By: Jackey Loge DO  On: 12/14/2019 08:20   DG Chest Port 1 View  Result Date: December 15, 2019 CLINICAL DATA:  COVID positive EXAM: PORTABLE CHEST 1 VIEW COMPARISON:  04/19/2019 chest radiograph and prior. FINDINGS: Hypoinflated lungs. Bilateral mid to lower lung patchy opacities, new since prior exam. No pneumothorax. Trace bilateral effusions. Cardiomediastinal silhouette within normal limits. IMPRESSION: New bilateral mid to lower lung patchy opacities. Hypoinflated lungs. Electronically Signed   By: Stana Bunting M.D.   On: Dec 15, 2019 13:57   DG Chest Port 1V same Day  Result Date: 12/27/2019 CLINICAL DATA:  Shortness of breath. EXAM: PORTABLE CHEST 1 VIEW COMPARISON:  Chest radiograph 12/26/2019 FINDINGS: Stable cardiomediastinal contours. Redemonstrated right upper extremity PICC. Low lung volumes. Persistent streaky infiltrates  in the bilateral mid to lower lungs, similar to prior. No new focal consolidation. No evidence of pneumothorax or large pleural effusion. IMPRESSION: Persistent streaky infiltrates in the bilateral mid to lower lungs, similar to prior. Electronically Signed   By: Emmaline Kluver M.D.   On: 12/27/2019 08:22   DG Chest Port 1V same Day  Result Date: 12/24/2019 CLINICAL DATA:  COVID-19 positive. Short of breath. Follow-up exam. EXAM: PORTABLE CHEST 1 VIEW COMPARISON:  12/23/2019. FINDINGS: Small left apical pneumothorax is similar to the previous day's exam. No visualized right pneumothorax on the current study. Pneumo mediastinum is stable. Hazy bilateral airspace lung opacities are unchanged. No new lung abnormalities. Right PICC has its tip in the lower superior vena cava, also unchanged IMPRESSION: 1. Tiny left apical pneumothorax appears similar to the previous day's exam. 2. No current evidence of a right pneumothorax. 3. Pneumo mediastinum 4. Stable well-positioned right PICC. 5. No change in bilateral hazy airspace lung opacities. Electronically Signed   By: Amie Portland M.D.   On: 12/24/2019 16:31   ECHOCARDIOGRAM LIMITED  Result Date: 12/16/2019    ECHOCARDIOGRAM LIMITED REPORT   Patient Name:   Linda Richmond Date of Exam: 12/16/2019 Medical Rec #:  [id number]     Height:       62.0 in Accession #:    [id number]    Weight:       262.0 lb Date of Birth:  11-20-1965      BSA:          2.144 m Patient Age:    54 years      BP:           109/73 mmHg Patient Gender: F             HR:           131 bpm. Exam Location:  Inpatient Procedure: Limited Echo, Limited Color Doppler, Cardiac Doppler and Intracardiac            Opacification Agent Indications:    Acute Hypoxic Respiratory Failure  History:        Patient has prior history of Echocardiogram examinations, most                 recent 09/06/2016. Risk Factors:Hypertension and Diabetes. GERD.  Sonographer:    [name] RDCS Referring Phys: [id number]  Deno Lunger [facility] IMPRESSIONS  1. Left ventricular ejection fraction, by estimation, is 60 to 65%. The left ventricle has normal function. The left ventricle has no regional wall motion abnormalities. There is mild left ventricular hypertrophy. Left ventricular diastolic function could not be evaluated.  2. The mitral valve is grossly normal.  3. The aortic valve is tricuspid. Aortic valve regurgitation is not visualized. No aortic stenosis is present.  Comparison(s): Prior images unable to be directly viewed, comparison made by report only. Conclusion(s)/Recommendation(s): Otherwise normal echocardiogram, with minor abnormalities described in the report. FINDINGS  Left Ventricle: Left ventricular ejection fraction, by estimation, is 60 to 65%. The left ventricle has normal function. The left ventricle has no regional wall motion abnormalities. Definity contrast agent was given IV to delineate the left ventricular  endocardial borders. There is mild left ventricular hypertrophy. Left ventricular diastolic function could not be evaluated. Left ventricular diastolic function could not be evaluated due to atrial fibrillation. Left Atrium: Left atrial size was normal in size. Right Atrium: Right atrial size was not well visualized. Pericardium: Trivial pericardial effusion is present. Mitral Valve: The mitral valve is grossly normal. Tricuspid Valve: The tricuspid valve is normal in structure. Tricuspid valve regurgitation is trivial. No evidence of tricuspid stenosis. Aortic Valve: The aortic valve is tricuspid. Aortic valve regurgitation is not visualized. No aortic stenosis is present. Pulmonic Valve: The pulmonic valve was not well visualized. Pulmonic valve regurgitation is not visualized. Aorta: The aortic root is normal in size and structure. Venous: The inferior vena cava was not well visualized. IAS/Shunts: The atrial septum is grossly normal. LEFT VENTRICLE PLAX 2D LVIDd:         4.50 cm LVIDs:         2.80 cm  LV PW:         1.10 cm LV IVS:        1.20 cm LVOT diam:     1.80 cm LV SV:         28 LV SV Index:   13 LVOT Area:     2.54 cm  LV Volumes (MOD) LV vol d, MOD A2C: 113.0 ml LV vol d, MOD A4C: 118.0 ml LV vol s, MOD A2C: 53.6 ml LV vol s, MOD A4C: 41.4 ml LV SV MOD A2C:     59.4 ml LV SV MOD A4C:     118.0 ml LV SV MOD BP:      71.7 ml LEFT ATRIUM             Index LA diam:        3.90 cm 1.82 cm/m LA Vol (A2C):   34.2 ml 15.95 ml/m LA Vol (A4C):   23.4 ml 10.91 ml/m LA Biplane Vol: 29.0 ml 13.53 ml/m  AORTIC VALVE LVOT Vmax:   83.60 cm/s LVOT Vmean:  49.400 cm/s LVOT VTI:    0.110 m  AORTA Ao Root diam: 2.90 cm MITRAL VALVE MV Area (PHT): 4.09 cm    SHUNTS MV Decel Time: 186 msec    Systemic VTI:  0.11 m MV E velocity: 72.40 cm/s  Systemic Diam: 1.80 cm Jodelle Red MD Electronically signed by Jodelle Red MD Signature Date/Time: 12/16/2019/9:51:17 PM    Final    [place] EKG Site Rite  Result Date: 12/23/2019 If Site Rite image not attached, placement could not be confirmed due to current cardiac rhythm.

## 2019-12-28 NOTE — Progress Notes (Signed)
RR nurse was informed that patient is waiting for a bed in ICU.

## 2019-12-28 NOTE — Progress Notes (Signed)
RT NOTES: Placed patient on heated high flow nasal cannula 40L/100% with NRB mask on top. Pt in no distress at this time. Will continue to monitor.

## 2019-12-28 NOTE — Progress Notes (Addendum)
Occupational Therapy Treatment Patient Details Name: Linda Richmond MRN: [id number] DOB: 1965-06-01 Today's Date: 12/28/2019    History of present illness Pt is a 54 y.o. female who tested (+) COVID-19 on 12/03/19, now admitted to [facility] 2019-12-12 with acute hypoxic respiratory failure due to covid PNA. Transferred to [facility] on 12/13/19 due to worsening hypoxemia. Afib with RVR 9/8. On 9/16 Pneumomediastinum, tiny left apical pneumothorax; 9/18 no pneumothorax. PMH includes DM, RA, morbid obesity.   OT comments  Pt progressing towards established OT goals. Pt reporting increased fatigue; SpO2 86-84% on 50L HHFNC with FiO2 at 100% +15L NRB. Pt participating in AROM exercises for BUE/BLE; SpO2 dropping to 76% and requiring rest breaks between. HR staying in 80-90s. Possible transfer to ICU later today. Pending pt progress, may need post-acute rehab for strengthening and activity tolerance. Will continue to follow acutely as admitted.    Follow Up Recommendations  No OT follow up;Supervision - Intermittent (Pending progress)    Equipment Recommendations  Tub/shower seat    Recommendations for Other Services      Precautions / Restrictions Precautions Precautions: Other (comment) Precaution Comments: Watch SATS       Mobility Bed Mobility               General bed mobility comments: In recliner upon arrival  Transfers                      Balance Overall balance assessment: No apparent balance deficits (not formally assessed)                                         ADL either performed or assessed with clinical judgement   ADL Overall ADL's : Needs assistance/impaired                                       General ADL Comments: Focused on ROM in sitting     Vision   Vision Assessment?: No apparent visual deficits   Perception     Praxis      Cognition Arousal/Alertness: Awake/alert Behavior During Therapy: WFL for tasks  assessed/performed;Anxious Overall Cognitive Status: Within Functional Limits for tasks assessed                                 General Comments: continues to show signs of anxiety        Exercises Exercises: General Upper Extremity;General Lower Extremity General Exercises - Upper Extremity Shoulder ABduction: AROM;10 reps;Both;Seated (with flexed elbows "chicken wing") Shoulder ADduction: AROM;10 reps;Both;Seated (with flexed elbows) General Exercises - Lower Extremity Ankle Circles/Pumps: AROM;Both;10 reps;Seated Long Arc Quad: AROM;Both;Seated;10 reps Hip Flexion/Marching: AROM;Both;Seated;10 reps   Shoulder Instructions       General Comments      Pertinent Vitals/ Pain       Pain Assessment: No/denies pain  Home Living                                          Prior Functioning/Environment              Frequency  Min 2X/week  Progress Toward Goals  OT Goals(current goals can now be found in the care plan section)  Progress towards OT goals: Progressing toward goals  Acute Rehab OT Goals Patient Stated Goal: return home OT Goal Formulation: With patient Time For Goal Achievement: 12/28/19 Potential to Achieve Goals: Good ADL Goals Pt Will Perform Grooming: with modified independence;standing Pt Will Transfer to Toilet: with modified independence;ambulating;regular height toilet Additional ADL Goal #1: Pt to demonstrate implementation of at least 3 energy conservation strategies during ADLs Additional ADL Goal #2: Pt to demonstrate ability to independently implement pursed lip breathing in order to maintain O2 >88%  Plan Discharge plan remains appropriate    Co-evaluation                 AM-PAC OT "6 Clicks" Daily Activity     Outcome Measure   Help from another person eating meals?: A Little Help from another person taking care of personal grooming?: A Little Help from another person toileting, which  includes using toliet, bedpan, or urinal?: A Little Help from another person bathing (including washing, rinsing, drying)?: A Little Help from another person to put on and taking off regular upper body clothing?: A Little Help from another person to put on and taking off regular lower body clothing?: A Little 6 Click Score: 18    End of Session Equipment Utilized During Treatment: Oxygen (50L HHFNC FiO2 100% +15L NRB)  OT Visit Diagnosis: Other (comment) (decreased cardiopulmonary tolerance)   Activity Tolerance Patient limited by fatigue   Patient Left with call bell/phone within reach;in chair   Nurse Communication Mobility status        Time: 9767-3419 OT Time Calculation (min): 19 min  Charges: OT General Charges $OT Visit: 1 Visit OT Treatments $Therapeutic Exercise: 8-22 mins  Andron Marrazzo MSOT, OTR/L Acute Rehab Pager: [phone] Office: [phone]   Theodoro Grist Sabriel Borromeo 12/28/2019, 4:21 PM

## 2019-12-28 NOTE — Consult Note (Addendum)
NAME:  Linda Richmond, MRN:  [id number], DOB:  September 15, 1965, LOS: 18 ADMISSION DATE:  12/27/2019, CONSULTATION DATE:  12/28/2019 REFERRING MD:  Dr. Jerral Ralph, CHIEF COMPLAINT:  Hypoxic resp failure/ COVID  Brief History   54 year old female initially admitted 9/3 at [facility] with hypoxic respiratory failure secondary to COVID 19 pneumonia.  Transferred to [facility] on 9/6 with increasing O2 requirements.  Hospitalization since complicated by Afib with RVR and worsening respiratory failure.  Now unable to tolerate HHFNC beyond 50L and NRB with saturations in the high 70s/80's.  PCCM consulted for ICU transfer and likely BiPAP.  History of present illness    54 year old female with history of DMT2, HTN, seronegative RA, anxiety, and depression who initially presented to [facility] on 9/3 with one week history of generalized body aches, headache, nasal congestion, and progressive shortness of breath.  She is unvaccinated.  Found to be COVID positive with saturations in the mid 80's on room air and CXR significant for multifocal opacities.  She was admitted by [facility].  On 9/6 she was transferred to [facility] given her increasing O2 requirements on NRB.  Hospitalization since complicated by Afib with RVR, and worsening respiratory failure.  Now unable to tolerate HHFNC beyond 50L and NRB with saturations in the high 70s/80's.  PCCM consulted for ICU transfer and likely BiPAP.  Past Medical History  DMT2, HTN, seronegative RA, anxiety, depression  Significant Hospital Events   9/3 Admitted to [facility] 9/6 Transferred to [facility] given worsening hypoxemia/ NRB 15L 9/8 Afib w/ RVR 9/19 cefepime added due to worsening leukocytosis/ persistent infiltrates  9/21 transitioned to [facility] and NRB/ PCCM consulted   Consults:  Cardiology 9/9 PCCM  9/21  Procedures:  9/16 PICC >  Significant Diagnostic Tests:  9/3>>Chest x-ray: New bilateral mid to lower lung patchy opacities 9/9>> Echo: EF 60-65% 9/16>> chest x-ray: Airspace opacity throughout  mid/lower lungs 9/16>> chest x-ray: Interval development of pneumomediastinum 9/17>> chest x-ray: Pneumomediastinum, tiny left apical pneumothorax-no change in bilateral airspace opacities 9/18>> chest x-ray: Stable extensive patchy hazy opacities-no pneumothorax. 9/19>> chest x-ray: Persistent airspace opacities over mid to lower lungs bilaterally. 9/20>> chest x-ray: Persistent streaky infiltrates to bilateral lungs.  Micro Data:  9/3 SARS2 >> positive  9/3 BCx 2 >> staph epi 1/2 9/13 MRSA PCR >> neg  Antimicrobials:  9/19 cefepime >>  baricitinib 9/5 >> 9/18  Decadron 9/3 > 9/4 Solumedrol 9/5 > 9/15; 9/20 > remdesivir 9/3 > 9/7  Interim history/subjective:  Awake and alert, states she is doing OK on the current oxygen, but sats are 83% while I am in the room.  Currently on 50% and NRB For transfer to ICU for BiPAP  Objective   Blood pressure 125/72, pulse 66, temperature 98.7 F (37.1 C), temperature source Oral, resp. rate (!) 22, height 5\' 2"  (1.575 m), weight 115 kg, last menstrual period 04/18/2018, SpO2 (!) 85 %.    FiO2 (%):  [100 %] 100 %   Intake/Output Summary (Last 24 hours) at 12/28/2019 1519 Last data filed at 12/28/2019 0945 Gross per 24 hour  Intake 440 ml  Output 500 ml  Net -60 ml   Filed Weights   12/27/19 0029 12/27/19 0450 12/28/19 0505  Weight: 113.3 kg 113.4 kg 115 kg   Examination: General: Awake and alert, obese female OOB in chair, NAD HENT: NCAT. Mp LAD, No JVD Lungs: Bilateral chest excursion, coarse throughout, RR is 22, no nasal flaring or accessory muscle use, diminished per bases Cardiovascular:  S1, S2, Irr, A fib per tele No RMG, Brisk cap refill Abdomen:  ND NT, BS +, Body mass index is 46.37 kg/m. Extremities: Warm and dry , brisk cap refill. No obvious deformities Neuro: Awake, alert and Oriented, MAE x 4, A&O x 3 GU: NA  Resolved Hospital Problem list    Assessment & Plan:   Hypoxic respiratory failure secondary to COVID  19 R/o bacterial pneumonia  OSA but not wearing CPAP Plan for BiPAP Plan  Tx to ICU for close monitoring, currently in no distress and happy hypoxic High intubation risk  NPO for now Maintain O2 saturations for goal SpO2 > 85% Monitor closely for signs of ventilatory failure such as nasal flaring, accessory muscle use, abdominal paradoxical movements.   Self proning as tolerated Trend inflammatory markers- CRP up trending despite restarting solumedrol 9/20 60 mg BID Ongoing aggressive pulmonary hygiene OOB to chair as much as possible BiPAP  S/p lasix 40mg  9/21 Continue cefepime, currently day 3x/5 - repeat PCT.  Was negative on 9/18.  If remains neg, stop abx.    DMT2 exacerbated by steroids  Plan  SSI resistant  Levemir 20 units BID Tradjenta 5mg  daily   Afib - currently NSR Continue cardizem and lopressor. Lovenox  Anxiety  Plan  Alprazolam prn   Hypothyroidism  Continue daily armour   Best practice:  Diet: NPO for now Pain/Anxiety/Delirium protocol (if indicated): n/a VAP protocol (if indicated): n/a DVT prophylaxis: lovenox full dose GI prophylaxis: PPI Glucose control: SSI as above Mobility: as tolerated, PT/ OT Code Status: full  Family Communication: patient updated on plan of care Disposition: transfer to ICU  Labs   CBC: Recent Labs  Lab 12/24/19 0404 12/25/19 0106 12/26/19 0500 12/27/19 0922 12/28/19 0402  WBC 14.1* 13.2* 16.7* 19.9* 16.2*  HGB 14.7 15.0 15.1* 14.1 14.2  HCT 45.1 45.9 45.9 44.2 43.9  MCV 90.4 92.4 92.5 92.7 91.1  PLT 308 324 314 281 247    Basic Metabolic Panel: Recent Labs  Lab 12/24/19 0404 12/25/19 0106 12/26/19 0500 12/27/19 0922 12/28/19 0402  NA 141 138 138 136 140  K 3.7 3.9 3.5 4.0 4.3  CL 104 101 99 97* 100  CO2 29 28 31 27 27   GLUCOSE 111* 134* 87 274* 240*  BUN 24* 27* 24* 19 23*  CREATININE 0.62 0.78 0.67 0.76 0.73  CALCIUM 8.2* 8.3* 8.5* 8.0* 8.6*   GFR: Estimated Creatinine Clearance: 94.3  mL/min (by C-G formula based on SCr of 0.73 mg/dL). Recent Labs  Lab 12/25/19 0106 12/26/19 0500 12/27/19 0922 12/28/19 0402  PROCALCITON <0.10  --   --   --   WBC 13.2* 16.7* 19.9* 16.2*    Liver Function Tests: Recent Labs  Lab 12/24/19 0404 12/25/19 0106 12/26/19 0500 12/27/19 0922 12/28/19 0402  AST 25 28 26 31  33  ALT 55* 54* 48* 45* 57*  ALKPHOS 68 72 75 79 86  BILITOT 0.5 0.5 0.7 0.7 0.9  PROT 5.7* 6.0* 6.0* 5.7* 6.0*  ALBUMIN 2.4* 2.4* 2.4* 2.3* 2.3*   No results for input(s): LIPASE, AMYLASE in the last 168 hours. No results for input(s): AMMONIA in the last 168 hours.  ABG No results found for: PHART, PCO2ART, PO2ART, HCO3, TCO2, ACIDBASEDEF, O2SAT   Coagulation Profile: No results for input(s): INR, PROTIME in the last 168 hours.  Cardiac Enzymes: No results for input(s): CKTOTAL, CKMB, CKMBINDEX, TROPONINI in the last 168 hours.  HbA1C: Hgb A1c MFr Bld  Date/Time Value Ref Range Status  12/26/2019 03:44 PM 10.8 (H) 4.8 - 5.6 % Final    Comment:    (NOTE) Pre diabetes:          5.7%-6.4%  Diabetes:              >6.4%  Glycemic control for   <7.0% adults with diabetes     CBG: Recent Labs  Lab 12/27/19 2129 12/27/19 2352 12/28/19 0252 12/28/19 0747 12/28/19 1206  GLUCAP 389* 312* 237* 229* 240*    Review of Systems:   Gen: Denies fever, chills, weight change, fatigue, night sweats HEENT: Denies blurred vision, double vision, hearing loss, tinnitus, sinus congestion, rhinorrhea, sore throat, neck stiffness, dysphagia PULM: + shortness of breath, No cough, + sputum production, hemoptysis, occasional wheezing CV: Denies chest pain, edema, orthopnea, paroxysmal nocturnal dyspnea, palpitations GI: Denies abdominal pain, nausea, vomiting, diarrhea, hematochezia, melena, constipation, change in bowel habits GU: Denies dysuria, hematuria, polyuria, oliguria, urethral discharge Endocrine: Denies hot or cold intolerance, polyuria, polyphagia or  appetite change Derm: Denies rash, dry skin, scaling or peeling skin change Heme: Denies easy bruising, bleeding, bleeding gums Neuro: Denies headache, numbness, weakness, slurred speech, loss of memory or consciousness  Past Medical History  She,  has a past medical history of Anxiety, Depression, Diabetes mellitus without complication (HCC), GERD (gastroesophageal reflux disease), Hypertension, Hypothyroidism, Palpitations, and Rheumatoid arthritis (HCC).   Surgical History    Past Surgical History:  Procedure Laterality Date  . BLADDER SURGERY     X's 3 for interstitial cystitis  . CHOLECYSTECTOMY       Social History   reports that she has never smoked. She has never used smokeless tobacco. She reports that she does not drink alcohol and does not use drugs.   Family History   Her family history includes High blood pressure in her mother; Pancreatic cancer in her father.   Allergies Allergies  Allergen Reactions  . Ancef [Cefazolin] Hives  . Aspirin Other (See Comments)    GI Bleed  . Doxycycline Diarrhea and Nausea And Vomiting  . Sulfa Antibiotics Hives    Possible loss of consciousness  . Ciprofloxacin Rash    "feels like bugs are crawling in my head"  . Lesinurad-Allopurinol Nausea And Vomiting  . Penicillins Rash     Home Medications  Prior to Admission medications   Medication Sig Start Date End Date Taking? Authorizing Provider  ALPRAZolam Prudy Feeler) 0.5 MG tablet Take 0.25-0.5 mg by mouth daily as needed for anxiety.    Yes [provider]  busPIRone (BUSPAR) 15 MG tablet Take 1 tablet by mouth in the morning, at noon, and at bedtime. 07/19/19  Yes [provider]  escitalopram (LEXAPRO) 5 MG tablet Take 5 mg by mouth daily. 07/19/19  Yes [provider]  levothyroxine (SYNTHROID) 25 MCG tablet Take 25 mcg by mouth daily. 10/04/19  Yes [provider]  olmesartan (BENICAR) 20 MG tablet Take 20 mg by mouth 2 (two) times daily.   Yes  [provider]  pantoprazole (PROTONIX) 40 MG tablet Take 40 mg by mouth daily as needed (for GERD/Avid reflux).    Yes [provider]  Vitamin D, Ergocalciferol, (DRISDOL) 50000 units CAPS capsule  07/17/16  Yes [provider]  apixaban (ELIQUIS) 5 MG TABS tablet Take 1 tablet (5 mg total) by mouth 2 (two) times daily. 12/23/19   Ghimire, Werner Lean, MD  furosemide (LASIX) 20 MG tablet Take 1 tablet (20 mg total) by mouth daily as needed for edema (  swelling). 08/23/16 11/21/16  Antoine Poche, MD  thyroid (ARMOUR) 30 MG tablet Take 30 mg by mouth daily before breakfast. Patient not taking: Reported on 12/11/2019    [provider]     Critical care time: 68 minutes    Bevelyn Ngo, MSN, AGACNP-BC [facility] Pulmonary/Critical Care Medicine See Amion for personal pager PCCM on call pager [phone] 12/28/2019 4:10 PM

## 2019-12-29 LAB — GLUCOSE, CAPILLARY
Glucose-Capillary: 175 mg/dL — ABNORMAL HIGH (ref 70–99)
Glucose-Capillary: 186 mg/dL — ABNORMAL HIGH (ref 70–99)
Glucose-Capillary: 199 mg/dL — ABNORMAL HIGH (ref 70–99)
Glucose-Capillary: 201 mg/dL — ABNORMAL HIGH (ref 70–99)
Glucose-Capillary: 220 mg/dL — ABNORMAL HIGH (ref 70–99)
Glucose-Capillary: 223 mg/dL — ABNORMAL HIGH (ref 70–99)
Glucose-Capillary: 258 mg/dL — ABNORMAL HIGH (ref 70–99)
Glucose-Capillary: 283 mg/dL — ABNORMAL HIGH (ref 70–99)

## 2019-12-29 LAB — COMPREHENSIVE METABOLIC PANEL
ALT: 121 U/L — ABNORMAL HIGH (ref 0–44)
AST: 86 U/L — ABNORMAL HIGH (ref 15–41)
Albumin: 2.3 g/dL — ABNORMAL LOW (ref 3.5–5.0)
Alkaline Phosphatase: 100 U/L (ref 38–126)
Anion gap: 15 (ref 5–15)
BUN: 36 mg/dL — ABNORMAL HIGH (ref 6–20)
CO2: 27 mmol/L (ref 22–32)
Calcium: 8.5 mg/dL — ABNORMAL LOW (ref 8.9–10.3)
Chloride: 100 mmol/L (ref 98–111)
Creatinine, Ser: 0.88 mg/dL (ref 0.44–1.00)
GFR calc Af Amer: >60 mL/min (ref >60)
GFR calc non Af Amer: >60 mL/min (ref >60)
Glucose, Bld: 218 mg/dL — ABNORMAL HIGH (ref 70–99)
Potassium: 3.9 mmol/L (ref 3.5–5.1)
Sodium: 142 mmol/L (ref 135–145)
Total Bilirubin: 0.8 mg/dL (ref 0.3–1.2)
Total Protein: 6.2 g/dL — ABNORMAL LOW (ref 6.5–8.1)

## 2019-12-29 LAB — CBC
HCT: 44.8 % (ref 36.0–46.0)
Hemoglobin: 14.6 g/dL (ref 12.0–15.0)
MCH: 29.6 pg (ref 26.0–34.0)
MCHC: 32.6 g/dL (ref 30.0–36.0)
MCV: 90.9 fL (ref 80.0–100.0)
Platelets: 239 10*3/uL (ref 150–400)
RBC: 4.93 MIL/uL (ref 3.87–5.11)
RDW: 13.5 % (ref 11.5–15.5)
WBC: 14.3 10*3/uL — ABNORMAL HIGH (ref 4.0–10.5)
nRBC: 0 % (ref 0.0–0.2)

## 2019-12-29 LAB — D-DIMER, QUANTITATIVE: D-Dimer, Quant: 1.15 ug/mL-FEU — ABNORMAL HIGH (ref 0.00–0.50)

## 2019-12-29 LAB — C-REACTIVE PROTEIN: CRP: 14.1 mg/dL — ABNORMAL HIGH (ref <1.0)

## 2019-12-29 LAB — PROCALCITONIN: Procalcitonin: 0.2 ng/mL

## 2019-12-29 MED ORDER — FUROSEMIDE 10 MG/ML IJ SOLN
40.0000 mg | Freq: Once | INTRAMUSCULAR | Status: AC
  Administered 2019-12-29: 40 mg via INTRAVENOUS
  Filled 2019-12-29: qty 4

## 2019-12-29 MED ORDER — METOPROLOL TARTRATE 5 MG/5ML IV SOLN
2.5000 mg | Freq: Four times a day (QID) | INTRAVENOUS | Status: DC | PRN
  Administered 2019-12-31: 2.5 mg via INTRAVENOUS
  Filled 2019-12-29 (×2): qty 5

## 2019-12-29 MED ORDER — FAMOTIDINE IN NACL 20-0.9 MG/50ML-% IV SOLN
20.0000 mg | Freq: Two times a day (BID) | INTRAVENOUS | Status: DC
  Administered 2019-12-29 – 2020-01-01 (×8): 20 mg via INTRAVENOUS
  Filled 2019-12-29 (×8): qty 50

## 2019-12-29 MED ORDER — ORAL CARE MOUTH RINSE: 15.0000 mL | Freq: Two times a day (BID) | OROMUCOSAL | Status: DC

## 2019-12-29 MED ORDER — POTASSIUM CHLORIDE 10 MEQ/100ML IV SOLN
10.0000 meq | INTRAVENOUS | Status: AC
  Administered 2019-12-29 (×2): 10 meq via INTRAVENOUS
  Filled 2019-12-29 (×2): qty 100

## 2019-12-29 MED ORDER — CHLORHEXIDINE GLUCONATE 0.12 % MT SOLN
15.0000 mL | Freq: Two times a day (BID) | OROMUCOSAL | Status: DC
  Administered 2019-12-29 – 2020-01-01 (×7): 15 mL via OROMUCOSAL
  Filled 2019-12-29 (×4): qty 15

## 2019-12-29 MED ORDER — LORAZEPAM 2 MG/ML IJ SOLN
0.5000 mg | Freq: Four times a day (QID) | INTRAMUSCULAR | Status: DC | PRN
  Administered 2019-12-29: 0.5 mg via INTRAVENOUS
  Filled 2019-12-29: qty 1

## 2019-12-29 NOTE — Progress Notes (Signed)
PT Cancellation Note  Patient Details Name: TANEAL SONNTAG MRN: [id number] DOB: 20-Jan-1966   Cancelled Treatment:    Reason Eval/Treat Not Completed: Medical issues which prohibited therapy Pt transferred to ICU and just placed back on bipap.  Spoke with nursing staff who request to hold PT today.  Will f/u as able. Anise Salvo, PT Acute Rehab Services Pager [phone] [facility] Re[facility] [phone]    Rayetta Humphrey 12/29/2019, 10:19 AM

## 2019-12-29 NOTE — Progress Notes (Signed)
Pt received from 5W to 3M11. Pt placed on HHFNC 50L/100% + 15L NRB. Pt transitioned to bipap per MD order. Pt tolerated well, RT will continue to monitor.

## 2019-12-29 NOTE — Consult Note (Signed)
NAME:  Linda Richmond, MRN:  [id number], DOB:  1966/01/13, LOS: 19 ADMISSION DATE:  12/13/2019, CONSULTATION DATE:  12/28/2019 REFERRING MD:  Dr. Jerral Ralph, CHIEF COMPLAINT:  Hypoxic resp failure/ COVID  Brief History   54 year old female initially admitted 9/3 at [facility] with hypoxic respiratory failure secondary to COVID 19 pneumonia.  Transferred to [facility] on 9/6 with increasing O2 requirements.  Hospitalization since complicated by Afib with RVR and worsening respiratory failure.  Now unable to tolerate HHFNC beyond 50L and NRB with saturations in the high 70s/80's.  PCCM consulted for ICU transfer and likely BiPAP.  History of present illness    54 year old female with history of DMT2, HTN, seronegative RA, anxiety, and depression who initially presented to [facility] on 9/3 with one week history of generalized body aches, headache, nasal congestion, and progressive shortness of breath.  She is unvaccinated.  Found to be COVID positive with saturations in the mid 80's on room air and CXR significant for multifocal opacities.  She was admitted by [facility].  On 9/6 she was transferred to [facility] given her increasing O2 requirements on NRB.  Hospitalization since complicated by Afib with RVR, and worsening respiratory failure.  Now unable to tolerate HHFNC beyond 50L and NRB with saturations in the high 70s/80's.  PCCM consulted for ICU transfer and likely BiPAP.  Past Medical History  DMT2, HTN, seronegative RA, anxiety, depression  Significant Hospital Events   9/3 Admitted to [facility] 9/6 Transferred to [facility] given worsening hypoxemia/ NRB 15L 9/8 Afib w/ RVR 9/19 cefepime added due to worsening leukocytosis/ persistent infiltrates  9/21 transitioned to [facility] and NRB/ PCCM consulted   Consults:  Cardiology 9/9 PCCM  9/21  Procedures:  9/16 PICC >  Significant Diagnostic Tests:  9/3>>Chest x-ray: New bilateral mid to lower lung patchy opacities 9/9>> Echo: EF 60-65% 9/16>> chest x-ray: Airspace opacity throughout  mid/lower lungs 9/16>> chest x-ray: Interval development of pneumomediastinum 9/17>> chest x-ray: Pneumomediastinum, tiny left apical pneumothorax-no change in bilateral airspace opacities 9/18>> chest x-ray: Stable extensive patchy hazy opacities-no pneumothorax. 9/19>> chest x-ray: Persistent airspace opacities over mid to lower lungs bilaterally. 9/20>> chest x-ray: Persistent streaky infiltrates to bilateral lungs.  Micro Data:  9/3 SARS2 >> positive  9/3 BCx 2 >> staph epi 1/2 9/13 MRSA PCR >> neg  Antimicrobials:  9/19 cefepime >>  baricitinib 9/5 >> 9/18  Decadron 9/3 > 9/4 Solumedrol 9/5 > 9/15; 9/20 > remdesivir 9/3 > 9/7  Interim history/subjective:  Patient was moved to ICU today, currently on BiPAP, looks comfortable with O2 sat 96%.  Objective   Blood pressure 94/61, pulse 73, temperature 98.1 F (36.7 C), temperature source Oral, resp. rate 17, height 5\' 2"  (1.575 m), weight 115 kg, last menstrual period 04/18/2018, SpO2 94 %.    Vent Mode: BIPAP;PCV FiO2 (%):  [100 %] 100 % Set Rate:  [12 bmp] 12 bmp PEEP:  [5 cmH20] 5 cmH20   Intake/Output Summary (Last 24 hours) at 12/29/2019 1131 Last data filed at 12/29/2019 0900 Gross per 24 hour  Intake 903 ml  Output 700 ml  Net 203 ml   Filed Weights   12/27/19 0029 12/27/19 0450 12/28/19 0505  Weight: 113.3 kg 113.4 kg 115 kg   Examination: General: Awake and alert, morbidly obese female, sitting in the bed HENT: NCAT. Mp LAD, No JVD Lungs: Bilateral coarse breath sounds, no crackles or wheezes Cardiovascular: S1, S2, Irr, A fib per tele No RMG, Brisk cap refill Abdomen:  ND NT, BS +, Body mass index is 46.37 kg/m. Extremities: Warm and dry , brisk cap refill. No obvious deformities Neuro: Awake, alert and Oriented, MAE x 4, A&O x 3 GU: NA  Resolved Hospital Problem list    Assessment & Plan:   Acute hypoxic respiratory failure due to ARDS from COVID 19 pneumonia OSA noncompliant with CPAP  Patient is  at high intubation risk  Maintain O2 saturations for goal SpO2 > 85%  Self proning as tolerated Continue IV Solu-Medrol High flow nasal cannula oxygen with a nonrebreather during daytime and BiPAP during night Lasix x1 Continue cefepime, currently day 4x/5 - repeat PCT.  Was negative on 9/18.  If remains neg, stop abx.    Poorly controlled diabetes Continue Levemir 20 units BID and sliding scale insulin Tradjenta 5mg  daily   Paroxysmal A. fib Continue cardizem and lopressor. Lovenox therapeutic for stroke prophylaxis  Anxiety  On Ativan as needed  Hypothyroidism  Continue daily armour   Best practice:  Diet: NPO for now Pain/Anxiety/Delirium protocol (if indicated): n/a VAP protocol (if indicated): n/a DVT prophylaxis: lovenox full dose GI prophylaxis: PPI Glucose control: SSI as above Mobility: as tolerated, PT/ OT Code Status: full  Family Communication: Patient's son was updated over the phone Disposition: transfer to ICU  Labs   CBC: Recent Labs  Lab 12/25/19 0106 12/26/19 0500 12/27/19 0922 12/28/19 0402 12/29/19 0415  WBC 13.2* 16.7* 19.9* 16.2* 14.3*  HGB 15.0 15.1* 14.1 14.2 14.6  HCT 45.9 45.9 44.2 43.9 44.8  MCV 92.4 92.5 92.7 91.1 90.9  PLT 324 314 281 247 239    Basic Metabolic Panel: Recent Labs  Lab 12/25/19 0106 12/26/19 0500 12/27/19 0922 12/28/19 0402 12/29/19 0415  NA 138 138 136 140 142  K 3.9 3.5 4.0 4.3 3.9  CL 101 99 97* 100 100  CO2 28 31 27 27 27   GLUCOSE 134* 87 274* 240* 218*  BUN 27* 24* 19 23* 36*  CREATININE 0.78 0.67 0.76 0.73 0.88  CALCIUM 8.3* 8.5* 8.0* 8.6* 8.5*   GFR: Estimated Creatinine Clearance: 85.8 mL/min (by C-G formula based on SCr of 0.88 mg/dL). Recent Labs  Lab 12/25/19 0106 12/25/19 0106 12/26/19 0500 12/27/19 0922 12/28/19 0402 12/29/19 0415  PROCALCITON <0.10  --   --   --   --  0.20  WBC 13.2*   < > 16.7* 19.9* 16.2* 14.3*   < > = values in this interval not displayed.    Liver Function  Tests: Recent Labs  Lab 12/25/19 0106 12/26/19 0500 12/27/19 0922 12/28/19 0402 12/29/19 0415  AST 28 26 31  33 86*  ALT 54* 48* 45* 57* 121*  ALKPHOS 72 75 79 86 100  BILITOT 0.5 0.7 0.7 0.9 0.8  PROT 6.0* 6.0* 5.7* 6.0* 6.2*  ALBUMIN 2.4* 2.4* 2.3* 2.3* 2.3*   No results for input(s): LIPASE, AMYLASE in the last 168 hours. No results for input(s): AMMONIA in the last 168 hours.  ABG No results found for: PHART, PCO2ART, PO2ART, HCO3, TCO2, ACIDBASEDEF, O2SAT   Coagulation Profile: No results for input(s): INR, PROTIME in the last 168 hours.  Cardiac Enzymes: No results for input(s): CKTOTAL, CKMB, CKMBINDEX, TROPONINI in the last 168 hours.  HbA1C: Hgb A1c MFr Bld  Date/Time Value Ref Range Status  12/24/2019 03:44 PM 10.8 (H) 4.8 - 5.6 % Final    Comment:    (NOTE) Pre diabetes:          5.7%-6.4%  Diabetes:              >  6.4%  Glycemic control for   <7.0% adults with diabetes     CBG: Recent Labs  Lab 12/28/19 2022 12/28/19 2220 12/29/19 0014 12/29/19 0419 12/29/19 0731  GLUCAP 269* 220* 223* 186* 199*    Review of Systems:   Gen: Denies fever, chills, weight change, fatigue, night sweats HEENT: Denies blurred vision, double vision, hearing loss, tinnitus, sinus congestion, rhinorrhea, sore throat, neck stiffness, dysphagia PULM: + shortness of breath, No cough, + sputum production, hemoptysis, occasional wheezing CV: Denies chest pain, edema, orthopnea, paroxysmal nocturnal dyspnea, palpitations GI: Denies abdominal pain, nausea, vomiting, diarrhea, hematochezia, melena, constipation, change in bowel habits GU: Denies dysuria, hematuria, polyuria, oliguria, urethral discharge Endocrine: Denies hot or cold intolerance, polyuria, polyphagia or appetite change Derm: Denies rash, dry skin, scaling or peeling skin change Heme: Denies easy bruising, bleeding, bleeding gums Neuro: Denies headache, numbness, weakness, slurred speech, loss of memory or  consciousness  Past Medical History  She,  has a past medical history of Anxiety, Depression, Diabetes mellitus without complication (HCC), GERD (gastroesophageal reflux disease), Hypertension, Hypothyroidism, Palpitations, and Rheumatoid arthritis (HCC).   Surgical History    Past Surgical History:  Procedure Laterality Date  . BLADDER SURGERY     X's 3 for interstitial cystitis  . CHOLECYSTECTOMY       Social History   reports that she has never smoked. She has never used smokeless tobacco. She reports that she does not drink alcohol and does not use drugs.   Family History   Her family history includes High blood pressure in her mother; Pancreatic cancer in her father.   Allergies Allergies  Allergen Reactions  . Ancef [Cefazolin] Hives  . Aspirin Other (See Comments)    GI Bleed  . Doxycycline Diarrhea and Nausea And Vomiting  . Sulfa Antibiotics Hives    Possible loss of consciousness  . Ciprofloxacin Rash    "feels like bugs are crawling in my head"  . Lesinurad-Allopurinol Nausea And Vomiting  . Penicillins Rash     Home Medications  Prior to Admission medications   Medication Sig Start Date End Date Taking? Authorizing Provider  ALPRAZolam Prudy Feeler) 0.5 MG tablet Take 0.25-0.5 mg by mouth daily as needed for anxiety.    Yes [provider]  busPIRone (BUSPAR) 15 MG tablet Take 1 tablet by mouth in the morning, at noon, and at bedtime. 07/19/19  Yes [provider]  escitalopram (LEXAPRO) 5 MG tablet Take 5 mg by mouth daily. 07/19/19  Yes [provider]  levothyroxine (SYNTHROID) 25 MCG tablet Take 25 mcg by mouth daily. 10/04/19  Yes [provider]  olmesartan (BENICAR) 20 MG tablet Take 20 mg by mouth 2 (two) times daily.   Yes [provider]  pantoprazole (PROTONIX) 40 MG tablet Take 40 mg by mouth daily as needed (for GERD/Avid reflux).    Yes [provider]  Vitamin D, Ergocalciferol, (DRISDOL) 50000 units  CAPS capsule  07/17/16  Yes [provider]  apixaban (ELIQUIS) 5 MG TABS tablet Take 1 tablet (5 mg total) by mouth 2 (two) times daily. 12/23/19   Ghimire, Werner Lean, MD  furosemide (LASIX) 20 MG tablet Take 1 tablet (20 mg total) by mouth daily as needed for edema (swelling). 08/23/16 11/21/16  Antoine Poche, MD  thyroid (ARMOUR) 30 MG tablet Take 30 mg by mouth daily before breakfast. Patient not taking: Reported on 12/11/2019    [provider]     Total critical care time: 40 minutes  Performed by: Cheri Fowler   Critical care time was exclusive of separately billable procedures and treating other patients.   Critical care was necessary to treat or prevent imminent or life-threatening deterioration.   Critical care was time spent personally by me on the following activities: development of treatment plan with patient and/or surrogate as well as nursing, discussions with consultants, evaluation of patient's response to treatment, examination of patient, obtaining history from patient or surrogate, ordering and performing treatments and interventions, ordering and review of laboratory studies, ordering and review of radiographic studies, pulse oximetry and re-evaluation of patient's condition.   Cheri Fowler MD Critical care physician [facility]  Pager: [phone] Mobile: [phone]

## 2019-12-29 NOTE — Progress Notes (Signed)
RT NOTES: Patient transported to 75M11 on 15L [place] and NRB mask. Once in room patient placed back on HHFNC 50L/100% and NRB mask. Report given to 75M RT.

## 2019-12-29 NOTE — Progress Notes (Signed)
Patient now on ICU service, discussed with Dr. Merrily Pew.  Will sign off. Please let us know when we can be of assistance. TRH.

## 2019-12-30 DIAGNOSIS — J9620 Acute and chronic respiratory failure, unspecified whether with hypoxia or hypercapnia: Secondary | ICD-10-CM

## 2019-12-30 LAB — GLUCOSE, CAPILLARY
Glucose-Capillary: 197 mg/dL — ABNORMAL HIGH (ref 70–99)
Glucose-Capillary: 144 mg/dL — ABNORMAL HIGH (ref 70–99)
Glucose-Capillary: 190 mg/dL — ABNORMAL HIGH (ref 70–99)
Glucose-Capillary: 204 mg/dL — ABNORMAL HIGH (ref 70–99)
Glucose-Capillary: 265 mg/dL — ABNORMAL HIGH (ref 70–99)

## 2019-12-30 LAB — CBC
HCT: 42.8 % (ref 36.0–46.0)
Hemoglobin: 13.9 g/dL (ref 12.0–15.0)
MCH: 29.8 pg (ref 26.0–34.0)
MCHC: 32.5 g/dL (ref 30.0–36.0)
MCV: 91.6 fL (ref 80.0–100.0)
Platelets: 220 10*3/uL (ref 150–400)
RBC: 4.67 MIL/uL (ref 3.87–5.11)
RDW: 13.6 % (ref 11.5–15.5)
WBC: 14.4 10*3/uL — ABNORMAL HIGH (ref 4.0–10.5)
nRBC: 0 % (ref 0.0–0.2)

## 2019-12-30 LAB — COMPREHENSIVE METABOLIC PANEL
ALT: 104 U/L — ABNORMAL HIGH (ref 0–44)
AST: 34 U/L (ref 15–41)
Albumin: 2.3 g/dL — ABNORMAL LOW (ref 3.5–5.0)
Alkaline Phosphatase: 87 U/L (ref 38–126)
Anion gap: 10 (ref 5–15)
BUN: 48 mg/dL — ABNORMAL HIGH (ref 6–20)
CO2: 29 mmol/L (ref 22–32)
Calcium: 8.6 mg/dL — ABNORMAL LOW (ref 8.9–10.3)
Chloride: 104 mmol/L (ref 98–111)
Creatinine, Ser: 0.91 mg/dL (ref 0.44–1.00)
GFR calc Af Amer: >60 mL/min (ref >60)
GFR calc non Af Amer: >60 mL/min (ref >60)
Glucose, Bld: 183 mg/dL — ABNORMAL HIGH (ref 70–99)
Potassium: 4.1 mmol/L (ref 3.5–5.1)
Sodium: 143 mmol/L (ref 135–145)
Total Bilirubin: 0.8 mg/dL (ref 0.3–1.2)
Total Protein: 6 g/dL — ABNORMAL LOW (ref 6.5–8.1)

## 2019-12-30 LAB — D-DIMER, QUANTITATIVE: D-Dimer, Quant: 0.76 ug/mL-FEU — ABNORMAL HIGH (ref 0.00–0.50)

## 2019-12-30 LAB — C-REACTIVE PROTEIN: CRP: 6.5 mg/dL — ABNORMAL HIGH (ref <1.0)

## 2019-12-30 MED ORDER — IPRATROPIUM-ALBUTEROL 0.5-2.5 (3) MG/3ML IN SOLN
3.0000 mL | Freq: Four times a day (QID) | RESPIRATORY_TRACT | Status: DC
  Administered 2019-12-30 (×3): 3 mL via RESPIRATORY_TRACT
  Filled 2019-12-30 (×4): qty 3

## 2019-12-30 MED ORDER — MELATONIN 5 MG PO TABS: 5.0000 mg | ORAL_TABLET | Freq: Every evening | ORAL | Status: DC | PRN

## 2019-12-30 MED ORDER — ALPRAZOLAM 0.25 MG PO TABS
0.2500 mg | ORAL_TABLET | Freq: Two times a day (BID) | ORAL | Status: DC | PRN
  Administered 2019-12-30 – 2019-12-31 (×2): 0.25 mg via ORAL
  Filled 2019-12-30 (×2): qty 1

## 2019-12-30 MED ORDER — IPRATROPIUM-ALBUTEROL 0.5-2.5 (3) MG/3ML IN SOLN
RESPIRATORY_TRACT | Status: AC
  Administered 2019-12-30: 3 mL via RESPIRATORY_TRACT
  Filled 2019-12-30: qty 3

## 2019-12-30 MED ORDER — FUROSEMIDE 10 MG/ML IJ SOLN
20.0000 mg | Freq: Once | INTRAMUSCULAR | Status: AC
  Administered 2019-12-30: 20 mg via INTRAVENOUS
  Filled 2019-12-30: qty 2

## 2019-12-30 MED ORDER — FUROSEMIDE 10 MG/ML IJ SOLN
40.0000 mg | Freq: Once | INTRAMUSCULAR | Status: AC
  Administered 2019-12-30: 40 mg via INTRAVENOUS
  Filled 2019-12-30: qty 4

## 2019-12-30 MED ORDER — POTASSIUM CHLORIDE CRYS ER 20 MEQ PO TBCR
40.0000 meq | EXTENDED_RELEASE_TABLET | Freq: Once | ORAL | Status: AC
  Administered 2019-12-30: 40 meq via ORAL
  Filled 2019-12-30: qty 2

## 2019-12-30 MED ORDER — IPRATROPIUM-ALBUTEROL 20-100 MCG/ACT IN AERS
1.0000 | INHALATION_SPRAY | Freq: Four times a day (QID) | RESPIRATORY_TRACT | Status: DC | PRN
  Filled 2019-12-30: qty 4

## 2019-12-30 MED ORDER — HYDROXYZINE HCL 10 MG PO TABS
10.0000 mg | ORAL_TABLET | Freq: Three times a day (TID) | ORAL | Status: DC | PRN
  Filled 2019-12-30: qty 1

## 2019-12-30 NOTE — Progress Notes (Signed)
Physical Therapy Treatment Patient Details Name: Linda Richmond MRN: [id number] DOB: 30-Jul-1965 Today's Date: 12/30/2019    History of Present Illness Pt is a 54 y.o. female who tested (+) COVID-19 on 12/03/19, now admitted to [facility] 12/31/2019 with acute hypoxic respiratory failure due to covid PNA. Transferred to [facility] on 12/13/19 due to worsening hypoxemia. Afib with RVR 9/8. On 9/16 Pneumomediastinum, tiny left apical pneumothorax; 9/18 no pneumothorax. PMH includes DM, RA, morbid obesity.    PT Comments    Pt sitting up in long sitting with non NRB at 15L and HHFNC at 40L at 100%FiO2 looking exhausted and a little anxious.  Pt was encouraged to give it her best and agreed to work toward OOB.  Each of 2  Transition/transfer trials caused a steady decline in SpO2 to low 80's with about a 5 min recover to higher 80's.  Pt took coaching well, but was never able to accomplish the planned ADL tasks from a sitting position.  Pt was left working on attaining comfortable sat levels in the recliner.      Follow Up Recommendations  No PT follow up;Other (comment) (will revisit as needed based on progression.)     Equipment Recommendations  None recommended by PT    Recommendations for Other Services       Precautions / Restrictions Precautions Precautions: Other (comment) (HHFNC with NRB )    Mobility  Bed Mobility Overal bed mobility: Needs Assistance Bed Mobility: Supine to Sit     Supine to sit: Min assist;HOB elevated        Transfers Overall transfer level: Needs assistance   Transfers: Sit to/from Stand;Stand Pivot Transfers Sit to Stand: Min assist Stand pivot transfers: Min assist       General transfer comment: for confidence only, pt would be able to move with min guard.  Ambulation/Gait Ambulation/Gait assistance: Min guard;Min assist Gait Distance (Feet): 5 Feet (to the chair, limited by [facility]) Assistive device: None           Stairs             Wheelchair  Mobility    Modified Rankin (Stroke Patients Only)       Balance Overall balance assessment: No apparent balance deficits (not formally assessed)                                          Cognition Arousal/Alertness: Awake/alert Behavior During Therapy: WFL for tasks assessed/performed;Anxious Overall Cognitive Status: Within Functional Limits for tasks assessed                                 General Comments: continues to show signs of anxiety      Exercises      General Comments General comments (skin integrity, edema, etc.): Pt on 40L at 100% FiO2, initially with SpO2 in the low 90's and HR in the 110's,   With movement to EOB and pivot over 5 feet, SpO2 dropped as low as 81%, RR in the lower 30's, HR upper 110's.  Pt reovered to 88% in about 5 min.  Pt took to gentle coaching for efficient breathing well.      Pertinent Vitals/Pain Pain Assessment: No/denies pain Pain Intervention(s): Monitored during session    Home Living  Prior Function            PT Goals (current goals can now be found in the care plan section) Acute Rehab PT Goals PT Goal Formulation: With patient Time For Goal Achievement: 01/11/20 Potential to Achieve Goals: Good Progress towards PT goals:  (Goals adjusted due to changes from COVID)    Frequency    Min 3X/week      PT Plan Current plan remains appropriate    Co-evaluation PT/OT/SLP Co-Evaluation/Treatment: Yes Reason for Co-Treatment: To address functional/ADL transfers PT goals addressed during session: Mobility/safety with mobility;Strengthening/ROM        AM-PAC PT "6 Clicks" Mobility   Outcome Measure  Help needed turning from your back to your side while in a flat bed without using bedrails?: A Little Help needed moving from lying on your back to sitting on the side of a flat bed without using bedrails?: A Little Help needed moving to and from a bed to a  chair (including a wheelchair)?: A Little Help needed standing up from a chair using your arms (e.g., wheelchair or bedside chair)?: A Little Help needed to walk in hospital room?: A Little Help needed climbing 3-5 steps with a railing? : A Little 6 Click Score: 18    End of Session   Activity Tolerance: Patient limited by fatigue Patient left: in chair;with call bell/phone within reach Nurse Communication: Mobility status;Other (comment) PT Visit Diagnosis: Other abnormalities of gait and mobility (R26.89)     Time: 9798-9211 PT Time Calculation (min) (ACUTE ONLY): 24 min  Charges:  $Therapeutic Activity: 8-22 mins                     12/30/2019  Jacinto Halim., PT Acute Rehabilitation Services [phone]  (pager) [phone]  (office)   Linda Richmond 12/30/2019, 5:38 PM

## 2019-12-30 NOTE — Progress Notes (Signed)
NAME:  Linda Richmond, MRN:  [id number], DOB:  Mar 26, 1966, LOS: 20 ADMISSION DATE:  12/16/2019, CONSULTATION DATE:  12/28/2019 REFERRING MD:  Dr. Jerral Ralph, CHIEF COMPLAINT:  Hypoxic resp failure/ COVID  Brief History   54 year old female initially admitted 9/3 at [facility] with hypoxic respiratory failure secondary to COVID 19 pneumonia.  Transferred to [facility] on 9/6 with increasing O2 requirements.  Hospitalization since complicated by Afib with RVR and worsening respiratory failure.  Now unable to tolerate HHFNC beyond 50L and NRB with saturations in the high 70s/80's.  PCCM consulted for ICU transfer and likely BiPAP.  History of present illness    54 year old female with history of DMT2, HTN, seronegative RA, anxiety, and depression who initially presented to [facility] on 9/3 with one week history of generalized body aches, headache, nasal congestion, and progressive shortness of breath.  She is unvaccinated.  Found to be COVID positive with saturations in the mid 80's on room air and CXR significant for multifocal opacities.  She was admitted by [facility].  On 9/6 she was transferred to [facility] given her increasing O2 requirements on NRB.  Hospitalization since complicated by Afib with RVR, and worsening respiratory failure.  Now unable to tolerate HHFNC beyond 50L and NRB with saturations in the high 70s/80's.  PCCM consulted for ICU transfer and likely BiPAP.  Past Medical History  DMT2, HTN, seronegative RA, anxiety, depression  Significant Hospital Events   9/3 Admitted to [facility] 9/6 Transferred to [facility] given worsening hypoxemia/ NRB 15L 9/8 Afib w/ RVR 9/19 cefepime added due to worsening leukocytosis/ persistent infiltrates  9/21 transitioned to [facility] and NRB/ PCCM consulted   Consults:  Cardiology 9/9 PCCM  9/21  Procedures:  9/16 PICC >  Significant Diagnostic Tests:  9/3>>Chest x-ray: New bilateral mid to lower lung patchy opacities 9/9>> Echo: EF 60-65% 9/16>> chest x-ray: Airspace opacity throughout  mid/lower lungs 9/16>> chest x-ray: Interval development of pneumomediastinum 9/17>> chest x-ray: Pneumomediastinum, tiny left apical pneumothorax-no change in bilateral airspace opacities 9/18>> chest x-ray: Stable extensive patchy hazy opacities-no pneumothorax. 9/19>> chest x-ray: Persistent airspace opacities over mid to lower lungs bilaterally. 9/20>> chest x-ray: Persistent streaky infiltrates to bilateral lungs.  Micro Data:  9/3 SARS2 >> positive  9/3 BCx 2 >> staph epi 1/2 9/13 MRSA PCR >> neg  Antimicrobials:  9/19 cefepime >>  baricitinib 9/5 >> 9/18  Decadron 9/3 > 9/4 Solumedrol 9/5 > 9/15; 9/20 > remdesivir 9/3 > 9/7  Interim history/subjective:  Fluctuating between HFNC and BiPAP in ICU  Feels like she didn't have a restful night sleep in the ICU   Objective   Blood pressure 126/75, pulse 75, temperature 98.1 F (36.7 C), temperature source Axillary, resp. rate 16, height 5\' 2"  (1.575 m), weight 112.7 kg, last menstrual period 04/18/2018, SpO2 90 %.    Vent Mode: BIPAP;PCV FiO2 (%):  [100 %] 100 % Set Rate:  [6 bmp-12 bmp] 6 bmp PEEP:  [5 cmH20-8 cmH20] 8 cmH20   Intake/Output Summary (Last 24 hours) at 12/30/2019 0730 Last data filed at 12/30/2019 0600 Gross per 24 hour  Intake 1403.89 ml  Output 900 ml  Net 503.89 ml   Filed Weights   12/27/19 0450 12/28/19 0505 12/30/19 0500  Weight: 113.4 kg 115 kg 112.7 kg   Examination: General: Morbidly obese, ill appearing middle aged F, reclined in bed NAD on BiPAP  HENT: NCAT pink mmm trachea midline, BiPAP in place with good seal Lungs: Symmetrical chest expansion. No accessory  muscle use on BiPAP. Coarse bilat breath sounds with slightly diminished bibasilar sounds  Cardiovascular: rrr s1s2 no rgm cap refill brisk Abdomen: obese soft round ndnt normoactive x4 Extremities: no obvious deformitiy. Rue PICC. Non-pitting BLE edema. np cyanosis or clubbnig Neuro: AAO x4 following commands PERRL GU: defer    Resolved Hospital Problem list    Assessment & Plan:   Acute hypoxic respiratory failure due to ARDS from COVID 19 pneumonia OSA noncompliant with CPAP High risk for intubation Encourage self-proning PRN BiPAP (mandatory at night) with HFNC + NRB during day if tolerates  SpO2 goal > 85% Continues on IV solumedrol  Completing 5 day cefepime  1x 40mg  lasix IV  Poorly controlled diabetes  Levemir 20 units BID   sliding scale insulin Tradjenta 5mg  daily   Paroxysmal A. fib ICU monitoring Scheduled Dilt, metop + PRN metop  Full dose lovenox  Anxiety  Sleep disturbance PRN ativan for anxiety PRN melatonin  Delirium precautions  Hypothyroidism  Continue daily armour   Best practice:  Diet: NPO on BiPAP Pain/Anxiety/Delirium protocol (if indicated): n/a VAP protocol (if indicated): n/a DVT prophylaxis: lovenox full dose GI prophylaxis: PPI Glucose control: SSI as above Mobility: as tolerated, PT/ OT Code Status: full  Family Communication: pending 9/23-- pt updated at bedside Disposition: ICU  Labs   CBC: Recent Labs  Lab 12/26/19 0500 12/27/19 0922 12/28/19 0402 12/29/19 0415 12/30/19 0507  WBC 16.7* 19.9* 16.2* 14.3* 14.4*  HGB 15.1* 14.1 14.2 14.6 13.9  HCT 45.9 44.2 43.9 44.8 42.8  MCV 92.5 92.7 91.1 90.9 91.6  PLT 314 281 247 239 220    Basic Metabolic Panel: Recent Labs  Lab 12/26/19 0500 12/27/19 0922 12/28/19 0402 12/29/19 0415 12/30/19 0507  NA 138 136 140 142 143  K 3.5 4.0 4.3 3.9 4.1  CL 99 97* 100 100 104  CO2 31 27 27 27 29   GLUCOSE 87 274* 240* 218* 183*  BUN 24* 19 23* 36* 48*  CREATININE 0.67 0.76 0.73 0.88 0.91  CALCIUM 8.5* 8.0* 8.6* 8.5* 8.6*   GFR: Estimated Creatinine Clearance: 81.8 mL/min (by C-G formula based on SCr of 0.91 mg/dL). Recent Labs  Lab 12/25/19 0106 12/26/19 0500 12/27/19 0922 12/28/19 0402 12/29/19 0415 12/30/19 0507  PROCALCITON <0.10  --   --   --  0.20  --   WBC 13.2*   < > 19.9* 16.2* 14.3*  14.4*   < > = values in this interval not displayed.    Liver Function Tests: Recent Labs  Lab 12/26/19 0500 12/27/19 0922 12/28/19 0402 12/29/19 0415 12/30/19 0507  AST 26 31 33 86* 34  ALT 48* 45* 57* 121* 104*  ALKPHOS 75 79 86 100 87  BILITOT 0.7 0.7 0.9 0.8 0.8  PROT 6.0* 5.7* 6.0* 6.2* 6.0*  ALBUMIN 2.4* 2.3* 2.3* 2.3* 2.3*   No results for input(s): LIPASE, AMYLASE in the last 168 hours. No results for input(s): AMMONIA in the last 168 hours.  ABG No results found for: PHART, PCO2ART, PO2ART, HCO3, TCO2, ACIDBASEDEF, O2SAT   Coagulation Profile: No results for input(s): INR, PROTIME in the last 168 hours.  Cardiac Enzymes: No results for input(s): CKTOTAL, CKMB, CKMBINDEX, TROPONINI in the last 168 hours.  HbA1C: Hgb A1c MFr Bld  Date/Time Value Ref Range Status  12/29/2019 03:44 PM 10.8 (H) 4.8 - 5.6 % Final    Comment:    (NOTE) Pre diabetes:          5.7%-6.4%  Diabetes:              >  6.4%  Glycemic control for   <7.0% adults with diabetes     CBG: Recent Labs  Lab 12/29/19 1408 12/29/19 1553 12/29/19 1951 12/29/19 2337 12/30/19 0337  GLUCAP 220* 201* 283* 258* 197*    CRITICAL CARE Performed by: Lanier Clam   Total critical care time: 35 minutes  Critical care time was exclusive of separately billable procedures and treating other patients. Critical care was necessary to treat or prevent imminent or life-threatening deterioration.  Critical care was time spent personally by me on the following activities: development of treatment plan with patient and/or surrogate as well as nursing, discussions with consultants, evaluation of patient's response to treatment, examination of patient, obtaining history from patient or surrogate, ordering and performing treatments and interventions, ordering and review of laboratory studies, ordering and review of radiographic studies, pulse oximetry and re-evaluation of patient's condition.   Tessie Fass MSN, AGACNP-BC [place] [facility] [phone] If no answer, [phone] 12/30/2019, 7:31 AM

## 2019-12-30 NOTE — Progress Notes (Signed)
Inpatient Diabetes Program Recommendations  AACE/ADA: New Consensus Statement on Inpatient Glycemic Control (2015)  Target Ranges:  Prepandial:   less than 140 mg/dL      Peak postprandial:   less than 180 mg/dL (1-2 hours)      Critically ill patients:  140 - 180 mg/dL   Lab Results  Component Value Date   GLUCAP 190 (H) 12/30/2019   HGBA1C 10.8 (H) January 03, 2020    Review of Glycemic Control Results for HONESTIE, KULIK (MRN [id number]) as of 12/30/2019 12:57  Ref. Range 12/29/2019 23:37 12/30/2019 03:37 12/30/2019 08:33 12/30/2019 11:11  Glucose-Capillary Latest Ref Range: 70 - 99 mg/dL 201 (H) 007 (H) 121 (H) 190 (H)    Current orders for Inpatient glycemic control: Novolog 0-20 units Q4H, Levemir 20 units BID, Tradjenta 5 mg QD Solumedrol 60 mg BID  Inpatient Diabetes Program Recommendations:   Glucose trends much improved with BID dosing of Levemir.  If plan is to initiate tube feeds consider adding Novolog 5 units Q4H for tube feed coverage (to be stopped or held in the event tube feeds are stopped).   Thanks, Lujean Rave, MSN, RNC-OB Diabetes Coordinator [phone] (8a-5p)

## 2019-12-30 NOTE — Progress Notes (Signed)
Nutrition Follow-up  DOCUMENTATION CODES:   Morbid obesity  INTERVENTION:   - RD will follow for diet advancement and provide oral nutrition supplements as appropriate  If unable to advance diet, recommend Cortrak placement and initiation of nutrition support. Recommend: - Osmolite 1.5 @ 50 ml/hr (1200 ml/day) - ProSource TF 90 ml BID  Recommended tube feeding regimen would provide 1960 kcal, 119 grams of protein, and 914 ml of H2O.   NUTRITION DIAGNOSIS:   Increased nutrient needs related to acute illness (COVID-19) as evidenced by estimated needs.  Ongoing  GOAL:   Patient will meet greater than or equal to 90% of their needs  Unmet at this time  MONITOR:   Diet advancement, Labs, Weight trends, TF tolerance, Skin, I & O's  REASON FOR ASSESSMENT:   Malnutrition Screening Tool    ASSESSMENT:   Linda Richmond  is a 54 y.o. female, with past medical history of anxiety, depression, diabetes, GERD, hypertension, vitamin D deficiency, negative rheumatoid arthritis, she presents to ED secondary to shortness of breath, reported began yesterday, patient reports for last week she started to have Covid symptoms including generalized body ache, headache, runny nose, nasal congestion, she was tested positive for COVID-19 on 8/27, prescribed Z-Pak and prednisone, she has finished both without much improvement, reports she started to have dyspnea yesterday, her son is admitted to [facility] due to COVID-19 as well, patient is unvaccinated, patient denies any chest pain, hemoptysis.  9/21 - NPO 9/22 - transferred to ICU, BiPAP  Discussed pt with RN and during ICU rounds. Pt remains NPO on BiPAP. Pt last ate on 9/21 with 2 meal completions charted of 75% at breakfast and 50% at lunch.  Discussed pt with CCM NP. Recommend Cortrak if unable to advance pt's diet. Will leave tube feeding recommendations.  Admit weight (9/03): 118.8 kg Current weight: 112.7 kg  Meal Completion:  50-100% prior to NPO   Medications reviewed and include: SSI q 4 hours, Levemir 20 units BID, tradjenta, solu-medrol, vitamin D weekly, IV abx, IV pepcid IVF: NS @ 10 ml/hr  Labs reviewed. CBG's: 144-283 x 24 hours  UOP: 900 ml x 24 hours I/O's: +3.2 L since admit  Diet Order:   Diet Order            Diet NPO time specified  Diet effective now                 EDUCATION NEEDS:   No education needs have been identified at this time  Skin:  Skin Assessment: Reviewed RN Assessment  Last BM:  12/24/19  Height:   Ht Readings from Last 1 Encounters:  12/23/2019 5\' 2"  (1.575 m)    Weight:   Wt Readings from Last 1 Encounters:  12/30/19 112.7 kg    Ideal Body Weight:  50 kg  BMI:  Body mass index is 45.44 kg/m.  Estimated Nutritional Needs:   Kcal:  1800-2000  Protein:  110-125 grams  Fluid:  > 1.8 L    01/01/20, MS, RD, LDN Inpatient Clinical Dietitian Please see AMiON for contact information.

## 2019-12-30 NOTE — Progress Notes (Signed)
Occupational Therapy Treatment Patient Details Name: Linda Richmond MRN: [id number] DOB: 1966/01/06 Today's Date: 12/30/2019    History of present illness Pt is a 54 y.o. female who tested (+) COVID-19 on 12/03/19, now admitted to [facility] 12/24/2019 with acute hypoxic respiratory failure due to covid PNA. Transferred to [facility] on 12/13/19 due to worsening hypoxemia. Afib with RVR 9/8. On 9/16 Pneumomediastinum, tiny left apical pneumothorax; 9/18 no pneumothorax. PMH includes DM, RA, morbid obesity.   OT comments  Pt seen for OT follow up with focus on ADL mobility progression and energy conservation training. Pt with t/f to ICU on 9/21. Pt on 40L at 100% FiO2, initially with SpO2 in the low 90's and HR in the 110's. Pt able to complete bed mobility and stand pivot transfer with min A for confidence boost. She remains quite anxious with movement and prefers to hang onto therapist for comfort. O2 dropped as low as 81% with RR into mid-low 30s with transfer. Pt required increased time to recover with mod-max cueing/coaching for ECS implementation.   Vitals: Pt required 40L of O2 via HHFNC and 15L NRB to maintain SpO2 >81% with stand pivot transfer. RR was up to mid 30s with activity. Pt required 10 mins to recover.    Follow Up Recommendations  No OT follow up;Supervision - Intermittent (pending progress)    Equipment Recommendations  Tub/shower seat    Recommendations for Other Services      Precautions / Restrictions Precautions Precautions: Other (comment) Precaution Comments: watch O2; HHFNC with NRB Restrictions Weight Bearing Restrictions: No       Mobility Bed Mobility Overal bed mobility: Needs Assistance Bed Mobility: Supine to Sit     Supine to sit: Min assist;HOB elevated        Transfers Overall transfer level: Needs assistance Equipment used: 1 person hand held assist Transfers: Sit to/from [employer] Sit to Stand: Min assist Stand pivot transfers: Min  assist       General transfer comment: for confidence only, pt would be able to move with min guard.    Balance Overall balance assessment: No apparent balance deficits (not formally assessed)                                         ADL either performed or assessed with clinical judgement   ADL Overall ADL's : Needs assistance/impaired     Grooming: Set up;Sitting Grooming Details (indicate cue type and reason): to wash face with monitoring of O2 sats due to NRB having to be taken on/off                 Toilet Transfer: Minimal assistance;+2 for safety/equipment;Stand-pivot Toilet Transfer Details (indicate cue type and reason): simualted to recliner for toilet transfer training. Pt required min A with secon person managing lines. Pt mostly anxious and wanting to hang onto therapist arm for comfort than phys support           General ADL Comments: pt ADL limited 2/2 activity tolerance and high O2 demands. Pt requires increased time and cueing to implement ECS and manage anxiety     Vision Patient Visual Report: No change from baseline Vision Assessment?: No apparent visual deficits   Perception     Praxis      Cognition Arousal/Alertness: Awake/alert Behavior During Therapy: Anxious Overall Cognitive Status: Within Functional Limits for tasks assessed  General Comments: anxious throughout session, requiring mod-max cues for relaxation and deep breathing        Exercises     Shoulder Instructions       General Comments Pt on 40L at 100% FiO2, initially with SpO2 in the low 90's and HR in the 110's,   With movement to EOB and pivot over 5 feet, SpO2 dropped as low as 81%, RR in the lower 30's, HR upper 110's.  Pt reovered to 88% in about 5 min.  Pt took to gentle coaching for efficient breathing well.    Pertinent Vitals/ Pain       Pain Assessment: No/denies pain Pain Intervention(s):  Monitored during session  Home Living                                          Prior Functioning/Environment              Frequency  Min 2X/week        Progress Toward Goals  OT Goals(current goals can now be found in the care plan section)  Progress towards OT goals: Progressing toward goals  Acute Rehab OT Goals Patient Stated Goal: visit with my daughter now that I am off precautions OT Goal Formulation: With patient Time For Goal Achievement: 01/13/20 Potential to Achieve Goals: Good  Plan Discharge plan remains appropriate    Co-evaluation      Reason for Co-Treatment: For patient/therapist safety PT goals addressed during session: Mobility/safety with mobility;Strengthening/ROM OT goals addressed during session: ADL's and self-care (ECS)      AM-PAC OT "6 Clicks" Daily Activity     Outcome Measure   Help from another person eating meals?: A Little Help from another person taking care of personal grooming?: A Little Help from another person toileting, which includes using toliet, bedpan, or urinal?: A Little Help from another person bathing (including washing, rinsing, drying)?: A Little Help from another person to put on and taking off regular upper body clothing?: A Little Help from another person to put on and taking off regular lower body clothing?: A Little 6 Click Score: 18    End of Session Equipment Utilized During Treatment: Oxygen  OT Visit Diagnosis: Other (comment) (decreased cardiopulmonary tolerance)   Activity Tolerance Patient tolerated treatment well   Patient Left in chair;with call bell/phone within reach   Nurse Communication Mobility status        Time: 5701-7793 OT Time Calculation (min): 24 min  Charges: OT General Charges $OT Visit: 1 Visit OT Treatments $Self Care/Home Management : 8-22 mins  Dalphine Handing, MSOT, OTR/L Acute Rehabilitation Services [facility] Office Number: [phone] Pager:  [phone]  Dalphine Handing 12/30/2019, 5:57 PM

## 2019-12-31 ENCOUNTER — Inpatient Hospital Stay (HOSPITAL_COMMUNITY): Payer: 59

## 2019-12-31 LAB — GLUCOSE, CAPILLARY
Glucose-Capillary: 207 mg/dL — ABNORMAL HIGH (ref 70–99)
Glucose-Capillary: 215 mg/dL — ABNORMAL HIGH (ref 70–99)
Glucose-Capillary: 219 mg/dL — ABNORMAL HIGH (ref 70–99)
Glucose-Capillary: 221 mg/dL — ABNORMAL HIGH (ref 70–99)
Glucose-Capillary: 234 mg/dL — ABNORMAL HIGH (ref 70–99)
Glucose-Capillary: 266 mg/dL — ABNORMAL HIGH (ref 70–99)
Glucose-Capillary: 272 mg/dL — ABNORMAL HIGH (ref 70–99)

## 2019-12-31 LAB — CBC
HCT: 44 % (ref 36.0–46.0)
Hemoglobin: 14.3 g/dL (ref 12.0–15.0)
MCH: 29.7 pg (ref 26.0–34.0)
MCHC: 32.5 g/dL (ref 30.0–36.0)
MCV: 91.5 fL (ref 80.0–100.0)
Platelets: 209 10*3/uL (ref 150–400)
RBC: 4.81 MIL/uL (ref 3.87–5.11)
RDW: 13.6 % (ref 11.5–15.5)
WBC: 12.7 10*3/uL — ABNORMAL HIGH (ref 4.0–10.5)
nRBC: 0 % (ref 0.0–0.2)

## 2019-12-31 LAB — COMPREHENSIVE METABOLIC PANEL
ALT: 105 U/L — ABNORMAL HIGH (ref 0–44)
AST: 37 U/L (ref 15–41)
Albumin: 2.6 g/dL — ABNORMAL LOW (ref 3.5–5.0)
Alkaline Phosphatase: 89 U/L (ref 38–126)
Anion gap: 15 (ref 5–15)
BUN: 56 mg/dL — ABNORMAL HIGH (ref 6–20)
CO2: 25 mmol/L (ref 22–32)
Calcium: 8.7 mg/dL — ABNORMAL LOW (ref 8.9–10.3)
Chloride: 102 mmol/L (ref 98–111)
Creatinine, Ser: 1.06 mg/dL — ABNORMAL HIGH (ref 0.44–1.00)
GFR calc Af Amer: >60 mL/min (ref >60)
GFR calc non Af Amer: 59 mL/min — ABNORMAL LOW (ref >60)
Glucose, Bld: 237 mg/dL — ABNORMAL HIGH (ref 70–99)
Potassium: 4.5 mmol/L (ref 3.5–5.1)
Sodium: 142 mmol/L (ref 135–145)
Total Bilirubin: 0.6 mg/dL (ref 0.3–1.2)
Total Protein: 6.2 g/dL — ABNORMAL LOW (ref 6.5–8.1)

## 2019-12-31 LAB — MAGNESIUM: Magnesium: 2.8 mg/dL — ABNORMAL HIGH (ref 1.7–2.4)

## 2019-12-31 LAB — C-REACTIVE PROTEIN: CRP: 4 mg/dL — ABNORMAL HIGH (ref <1.0)

## 2019-12-31 LAB — PHOSPHORUS: Phosphorus: 4.3 mg/dL (ref 2.5–4.6)

## 2019-12-31 LAB — BRAIN NATRIURETIC PEPTIDE: B Natriuretic Peptide: 61.3 pg/mL (ref 0.0–100.0)

## 2019-12-31 LAB — D-DIMER, QUANTITATIVE: D-Dimer, Quant: 0.63 ug/mL-FEU — ABNORMAL HIGH (ref 0.00–0.50)

## 2019-12-31 MED ORDER — INSULIN DETEMIR 100 UNIT/ML ~~LOC~~ SOLN
30.0000 [IU] | Freq: Two times a day (BID) | SUBCUTANEOUS | Status: DC
  Administered 2019-12-31 – 2020-01-02 (×4): 30 [IU] via SUBCUTANEOUS
  Filled 2019-12-31 (×6): qty 0.3

## 2019-12-31 MED ORDER — OSMOLITE 1.2 CAL PO LIQD: 1000.0000 mL | ORAL | Status: DC

## 2019-12-31 MED ORDER — IPRATROPIUM-ALBUTEROL 0.5-2.5 (3) MG/3ML IN SOLN
3.0000 mL | Freq: Three times a day (TID) | RESPIRATORY_TRACT | Status: DC
  Administered 2019-12-31 – 2020-01-04 (×11): 3 mL via RESPIRATORY_TRACT
  Filled 2019-12-31 (×11): qty 3

## 2019-12-31 MED ORDER — OSMOLITE 1.5 CAL PO LIQD
1000.0000 mL | ORAL | Status: DC
  Filled 2019-12-31: qty 1000

## 2019-12-31 MED ORDER — ENOXAPARIN SODIUM 120 MG/0.8ML ~~LOC~~ SOLN
110.0000 mg | Freq: Two times a day (BID) | SUBCUTANEOUS | Status: DC
  Filled 2019-12-31: qty 0.74

## 2019-12-31 MED ORDER — DILTIAZEM HCL-DEXTROSE 125-5 MG/125ML-% IV SOLN (PREMIX)
5.0000 mg/h | INTRAVENOUS | Status: DC
  Administered 2019-12-31: 5 mg/h via INTRAVENOUS
  Administered 2020-01-01 (×2): 15 mg/h via INTRAVENOUS
  Filled 2019-12-31 (×4): qty 125

## 2019-12-31 MED ORDER — AYR SALINE NASAL NA GEL
1.0000 "application " | NASAL | Status: DC | PRN
  Administered 2019-12-31 (×2): 1 via NASAL
  Filled 2019-12-31: qty 14.1

## 2019-12-31 MED ORDER — ENSURE ENLIVE PO LIQD
237.0000 mL | Freq: Three times a day (TID) | ORAL | Status: DC
  Administered 2019-12-31: 237 mL via ORAL

## 2019-12-31 MED ORDER — FUROSEMIDE 10 MG/ML IJ SOLN
20.0000 mg | Freq: Once | INTRAMUSCULAR | Status: AC
  Administered 2019-12-31: 20 mg via INTRAVENOUS
  Filled 2019-12-31: qty 2

## 2019-12-31 NOTE — Progress Notes (Signed)
Cortrak Tube Team Note:  Consult received to place a Cortrak feeding tube.   Pt alert, on HFNC/NRB.   Attempted to place Cortrak tubes in both nares. Barely able to insert tube before meeting resistance in both nares. Pt with dried blood/clots occluding both sides of patient nose. RD and RN both attempted to clean out pt nose. Mostly unsuccessful. Pt also desats very quickly into low 70s when NRB removed.   Depsite multiple attempts, unable to place Cortrak tube. RN notified MD.   Romelle Starcher MS, RDN, LDN, CNSC Registered Dietitian III Clinical Nutrition RD Pager and On-Call Pager Number Located in [place]

## 2019-12-31 NOTE — Progress Notes (Signed)
ANTICOAGULATION CONSULT NOTE - Follow Up Consult  Pharmacy Consult Lovenox  Indication: atrial fibrillation  Allergies  Allergen Reactions  . Ancef [Cefazolin] Hives  . Aspirin Other (See Comments)    GI Bleed  . Doxycycline Diarrhea and Nausea And Vomiting  . Sulfa Antibiotics Hives    Possible loss of consciousness  . Ciprofloxacin Rash    "feels like bugs are crawling in my head"  . Lesinurad-Allopurinol Nausea And Vomiting  . Penicillins Rash    Patient Measurements: Height: 5\' 2"  (157.5 cm) Weight: 108.7 kg (239 lb 10.2 oz) IBW/kg (Calculated) : 50.1  Vital Signs: Temp: 97.6 F (36.4 C) (09/24 0753) Temp Source: Axillary (09/24 0753) BP: 108/76 (09/24 0805) Pulse Rate: 77 (09/24 0805)  Labs: Recent Labs    12/29/19 0415 12/29/19 0415 12/30/19 0507 12/31/19 0418  HGB 14.6   < > 13.9 14.3  HCT 44.8  --  42.8 44.0  PLT 239  --  220 209  CREATININE 0.88  --  0.91 1.06*   < > = values in this interval not displayed.    Estimated Creatinine Clearance: 68.8 mL/min (A) (by C-G formula based on SCr of 1.06 mg/dL (H)).  Assessment: 54 year old female with COVID previously on apixaban for Afib transitioned back to Lovenox on 9/21.   Today, her CBC is stable with H/H 14.3/44, Plt 209. D-dimer down at 0.63. No signs of bleeding noted. Patient with weight trending down, now ~109kg. Plan to adjust dose slightly and continue to follow.   Goal of Therapy:  Anti-Xa level 0.6-1 units/ml 4hrs after LMWH dose given Monitor platelets by anticoagulation protocol: Yes   Plan:  Decrease Lovenox to 110 mg sq Q 12 Follow CBC, renal function, s/sx of bleeding   10-28-1996, PharmD PGY1 Acute Care Pharmacy Resident  12/31/2019 10:07 AM  Please check [email or web address] for unit-specific pharmacy phone numbers.

## 2019-12-31 NOTE — Progress Notes (Addendum)
   A f ib RVR  Did not respond to lopressor  RN also reports some epistaxios earlier with cortrak and now vaginal bleed and conjunctival hge with cough  Plan  - cardizem gtt  - get ekg - dc lovenox    SIGNATURE    Dr. Kalman Shan, M.D., F.C.C.P,  Pulmonary and Critical Care Medicine Staff Physician, [facility] Center Director - Interstitial Lung Disease  Program  Pulmonary Fibrosis [facility] at [facility] [place], [place], [address]  Pager: [phone], If no answer  OR between  19:00-7:00h: page [phone] Telephone (clinical office): [phone] Telephone (research): [phone]  3:34 PM 12/31/2019

## 2019-12-31 NOTE — Progress Notes (Signed)
Physical Therapy Treatment Patient Details Name: Linda Richmond MRN: [id number] DOB: 1965-10-30 Today's Date: 12/31/2019    History of Present Illness Pt is a 54 y.o. female who tested (+) COVID-19 on 12/03/19, now admitted to [facility] 12/24/2019 with acute hypoxic respiratory failure due to covid PNA. Transferred to [facility] on 12/13/19 due to worsening hypoxemia. Afib with RVR 9/8. On 9/16 Pneumomediastinum, tiny left apical pneumothorax; 9/18 no pneumothorax. PMH includes DM, RA, morbid obesity.    PT Comments    Pt agreeable to getting OOB.  Little encouragement needed due to her bed was soaked in urine.  Plan was to get to chair and assist nursing with total body bath.  Emphasis on transition, sitting to recover.  Sit to stand x3, transfers and coaching for efficient breathing to assist recovery.   Follow Up Recommendations  No PT follow up;Other (comment) (will revisit as needed based on progression.)     Equipment Recommendations  None recommended by PT    Recommendations for Other Services       Precautions / Restrictions Precautions Precautions: Other (comment) (HHFNC with NRB )    Mobility  Bed Mobility Overal bed mobility: Needs Assistance Bed Mobility: Supine to Sit     Supine to sit: Min assist;HOB elevated        Transfers Overall transfer level: Needs assistance   Transfers: Sit to/from Stand;Stand Pivot Transfers Sit to Stand: Min assist Stand pivot transfers: Min assist       General transfer comment: pt needing light min due to low reserve and low energy levels  Ambulation/Gait     Assistive device: None           Stairs             Wheelchair Mobility    Modified Rankin (Stroke Patients Only)       Balance Overall balance assessment: Needs assistance Sitting-balance support: No upper extremity supported Sitting balance-Leahy Scale: Fair     Standing balance support: Single extremity supported;During functional activity Standing  balance-Leahy Scale: Fair Standing balance comment: pt prefers support for energy conservation.  pt stood x3 for whole body cleansing over 3 trial of standing.                            Cognition Arousal/Alertness: Awake/alert Behavior During Therapy: WFL for tasks assessed/performed;Anxious Overall Cognitive Status: Within Functional Limits for tasks assessed                                 General Comments: continues to show signs of anxiety      Exercises      General Comments General comments (skin integrity, edema, etc.): On 50L at 100% FiO2, generally with SpO2 at 88-92 at rest RR in the 30's.  After transition to EOB SpO2 dropped to 81%, RR up to 30's, HR in the 110-120's.  Recovery wint about 2 min.  After transfer to chair and the stand for 1+ min for finishing of peri-care, pt's sats dropped to 69%  HR up to 170's to 190's.  RN was in room with therapy the whole session to help.  Pt took 15-20+ min to recover.      Pertinent Vitals/Pain Pain Assessment: No/denies pain    Home Living  Prior Function            PT Goals (current goals can now be found in the care plan section) Acute Rehab PT Goals PT Goal Formulation: With patient Time For Goal Achievement: 01/11/20 Potential to Achieve Goals: Good    Frequency    Min 3X/week      PT Plan      Co-evaluation PT/OT/SLP Co-Evaluation/Treatment: Yes            AM-PAC PT "6 Clicks" Mobility   Outcome Measure  Help needed turning from your back to your side while in a flat bed without using bedrails?: A Little Help needed moving from lying on your back to sitting on the side of a flat bed without using bedrails?: A Little Help needed moving to and from a bed to a chair (including a wheelchair)?: A Little Help needed standing up from a chair using your arms (e.g., wheelchair or bedside chair)?: A Little Help needed to walk in hospital room?: A  Little Help needed climbing 3-5 steps with a railing? : A Little 6 Click Score: 18    End of Session   Activity Tolerance: Patient limited by fatigue Patient left: in chair;with call bell/phone within reach;with nursing/sitter in room Nurse Communication: Mobility status;Other (comment) PT Visit Diagnosis: Other abnormalities of gait and mobility (R26.89);Difficulty in walking, not elsewhere classified (R26.2)     Time: 8338-2505 PT Time Calculation (min) (ACUTE ONLY): 28 min  Charges:  $Therapeutic Activity: 23-37 mins                     12/31/2019  Jacinto Halim., PT Acute Rehabilitation Services [phone]  (pager) [phone]  (office)   Eliseo Gum Raeqwon Lux 12/31/2019, 5:57 PM

## 2019-12-31 NOTE — Progress Notes (Addendum)
Nutrition Follow-up  DOCUMENTATION CODES:   Morbid obesity  INTERVENTION:   Recommend initiate TPN since unable to place Cortrak feeding tube and oral intake is inadequate. Patient will be unable to meet increased nutrition needs at this time without parenteral nutrition.  Recommend bowel regimen, no BM documented since 9/17.  Add Ensure Enlive po TID to be offered if respiratory status is adequate for PO intake, each supplement provides 350 kcal and 20 grams of protein.   NUTRITION DIAGNOSIS:   Increased nutrient needs related to acute illness (COVID-19) as evidenced by estimated needs.  Ongoing  GOAL:   Patient will meet greater than or equal to 90% of their needs  Progressing  MONITOR:   PO intake, Supplement acceptance, Labs, Skin  REASON FOR ASSESSMENT:   Consult Enteral/tube feeding initiation and management  ASSESSMENT:   Linda Richmond  is a 54 y.o. female, with past medical history of anxiety, depression, diabetes, GERD, hypertension, vitamin D deficiency, negative rheumatoid arthritis, she presents to ED secondary to shortness of breath, reported began yesterday, patient reports for last week she started to have Covid symptoms including generalized body ache, headache, runny nose, nasal congestion, she was tested positive for COVID-19 on 8/27, prescribed Z-Pak and prednisone, she has finished both without much improvement, reports she started to have dyspnea yesterday, her son is admitted to [facility] due to COVID-19 as well, patient is unvaccinated, patient denies any chest pain, hemoptysis.  9/21 - NPO 9/22 - transferred to ICU, BiPAP 9/23 - clear liquids for supper  Patient is now tolerating HFNC + NRB during the day, required BiPAP last night. Diet was advanced to heart healthy CHO modified, patient felt ready to eat this morning, but now unable to remove NRB mask for POs. Cortrak was unable to be placed today despite multiple attempts due to dried  blood clots in both nostrils and decline in oxygen sats when NRB mask removed. Given inability to take adequate nutrition by enteral route, patient would benefit from parenteral nutrition to meet kcal and protein needs.  Weight is down 10.1 kg since admission, related to increased nutrient needs with inadequate intake. 8.5% weight loss is severe and significant for the time frame.   Admit weight (9/03): 118.8 kg Current weight: 108.7 kg  Medications reviewed and include novoLOG SSI q 4 hours, Levemir 20 units BID, tradjenta, solu-medrol, vitamin D weekly, IV pepcid. IVF: NS @ 10 ml/hr  Labs reviewed.  CBG: 207-215 this morning  UOP: 1,500 ml x 24 hours I/O's: +2,265 ml since admit  Diet Order:   Diet Order            Diet heart healthy/carb modified Room service appropriate? Yes; Fluid consistency: Thin  Diet effective now                 EDUCATION NEEDS:   No education needs have been identified at this time  Skin:  Skin Assessment: Reviewed RN Assessment  Last BM:  9/17 type 4  Height:   Ht Readings from Last 1 Encounters:  12/29/2019 5\' 2"  (1.575 m)    Weight:   Wt Readings from Last 1 Encounters:  12/31/19 108.7 kg    Ideal Body Weight:  50 kg  BMI:  Body mass index is 43.83 kg/m.  Estimated Nutritional Needs:   Kcal:  2000-2200  Protein:  110-125 grams  Fluid:  > 1.8 L    01/02/20, RD, LDN, CNSC Please refer to Amion for contact information.

## 2019-12-31 NOTE — Progress Notes (Signed)
NAME:  Linda Richmond, MRN:  [id number], DOB:  1965/11/18, LOS: 21 ADMISSION DATE:  12-28-2019, CONSULTATION DATE:  12/28/2019 REFERRING MD:  Dr. Jerral Ralph, CHIEF COMPLAINT:  Hypoxic resp failure/ COVID  Brief History   54 year old female initially admitted 9/3 at [facility] with hypoxic respiratory failure secondary to COVID 19 pneumonia.  Transferred to [facility] on 9/6 with increasing O2 requirements.  Hospitalization since complicated by Afib with RVR and worsening respiratory failure.  Now unable to tolerate HHFNC beyond 50L and NRB with saturations in the high 70s/80's.  PCCM consulted for ICU transfer and likely BiPAP.  History of present illness    54 year old female with history of DMT2, HTN, seronegative RA, anxiety, and depression who initially presented to [facility] on 9/3 with one week history of generalized body aches, headache, nasal congestion, and progressive shortness of breath.  She is unvaccinated.  Found to be COVID positive with saturations in the mid 80's on room air and CXR significant for multifocal opacities.  She was admitted by [facility].  On 9/6 she was transferred to [facility] given her increasing O2 requirements on NRB.  Hospitalization since complicated by Afib with RVR, and worsening respiratory failure.  Now unable to tolerate HHFNC beyond 50L and NRB with saturations in the high 70s/80's.  PCCM consulted for ICU transfer and likely BiPAP.  Past Medical History  DMT2, HTN, seronegative RA, anxiety, depression  Significant Hospital Events   9/3 Admitted to [facility] 9/6 Transferred to [facility] given worsening hypoxemia/ NRB 15L 9/8 Afib w/ RVR 9/19 cefepime added due to worsening leukocytosis/ persistent infiltrates  9/21 transitioned to [facility] and NRB/ PCCM consulted   Consults:  Cardiology 9/9 PCCM  9/21  Procedures:  9/16 PICC >  Significant Diagnostic Tests:  9/3>>Chest x-ray: New bilateral mid to lower lung patchy opacities 9/9>> Echo: EF 60-65% 9/16>> chest x-ray: Airspace opacity throughout  mid/lower lungs 9/16>> chest x-ray: Interval development of pneumomediastinum 9/17>> chest x-ray: Pneumomediastinum, tiny left apical pneumothorax-no change in bilateral airspace opacities 9/18>> chest x-ray: Stable extensive patchy hazy opacities-no pneumothorax. 9/19>> chest x-ray: Persistent airspace opacities over mid to lower lungs bilaterally. 9/20>> chest x-ray: Persistent streaky infiltrates to bilateral lungs. 9/24 CXR> stable low lung volumes, but with some interval improvement to bilateral opacities   Micro Data:  9/3 SARS2 >> positive  9/3 BCx 2 >> staph epi 1/2 9/13 MRSA PCR >> neg  Antimicrobials:  9/19 cefepime >>  baricitinib 9/5 >> 9/18  Decadron 9/3 > 9/4 Solumedrol 9/5 > 9/15; 9/20 > remdesivir 9/3 > 9/7  Interim history/subjective:  Tolerated day shift off of BiPAP  Tolerated BiPAP well at night  Tolerating clear liquids and feels ready to eat   Objective   Blood pressure 108/76, pulse 77, temperature 97.6 F (36.4 C), temperature source Axillary, resp. rate 15, height 5\' 2"  (1.575 m), weight 108.7 kg, last menstrual period 04/18/2018, SpO2 (!) 89 %.    Vent Mode: BIPAP;PCV FiO2 (%):  [100 %] 100 % Set Rate:  [6 bmp] 6 bmp PEEP:  [8 cmH20] 8 cmH20   Intake/Output Summary (Last 24 hours) at 12/31/2019 0820 Last data filed at 12/31/2019 0500 Gross per 24 hour  Intake 498.87 ml  Output 1500 ml  Net -1001.13 ml   Filed Weights   12/28/19 0505 12/30/19 0500 12/31/19 0500  Weight: 115 kg 112.7 kg 108.7 kg   Examination: General:  Morbidly obese middle aged F, reclined in bed NAD on HFNC + NRB HENT: NCAT. Pink  mmm. Anicteric sclera. Pink mmm  Lungs: Symmetrical chest expansion, even unlabored respirations on HFNC + NRB. Coarse breath sounds without rhonchi or crackles  Cardiovascular: RRR s1s2 no rgm cap refill brisk  Abdomen: obese soft round ndnt normoactive x4  Extremities: No obvious deformity, no cyanosis or clubbing. Non-bitting extremity  edema Neuro: AAOx4 following commands PERRLA GU: defer  Resolved Hospital Problem list    Assessment & Plan:   Acute hypoxic respiratory failure due to ARDS from COVID 19 pneumonia OSA noncompliant with CPAP Encourage self proning, mobility -- Bed in chair position if unable to tolerate OOBTC  HFNC + NRB during day with qHS BiPAP  If requiring day BiPAP, consider NPO + cortrak but seems to be progressing without  SpO2 goal > 85% Continues on IV solumedrol, likely approaching wean  Continue goal net neg to net even. Will give lasix 9/24   AKI, mild. Cr elevation likely r/t diuresis which is not unexpected  Trend renal indices, electrolytes  Minimize nephrotoxins as able  Poorly controlled diabetes  Levemir 20 units BID   sliding scale insulin Tradjenta 5mg  daily  DM coordinator following, will appreciate recs. Suspect we may need to increase basal further   Paroxysmal A. fib ICU monitoring Scheduled Dilt, metop + PRN metop  Full dose lovenox K goal 4 Mag goal 2  Anxiety   Sleep disturbance At risk for ICU delirium PRN xanax. Dc atarax  PRN melatonin  Delirium precautions  Hypothyroidism  Continue daily armour   Inadequate PO intake NPO while requiring BiPAP, however has clinically progressed and is able to tolerate day shift without BiPAP Will advance diet  If unable to tolerate, will proceed with cortrak + EN, with appreciation for RDN recommendations  Best practice:  Diet: clear liquids, likely will continue to advance as tolerated  Pain/Anxiety/Delirium protocol (if indicated): QHS melatonin, PRN xanax  VAP protocol (if indicated): n/a DVT prophylaxis: lovenox full dose GI prophylaxis: PPI Glucose control: basal + SSI + tradjenta  Mobility: as tolerated, PT/ OT Code Status: full  Family Communication: d/w patient 9/24 Disposition: ICU  Labs   CBC: Recent Labs  Lab 12/27/19 0922 12/28/19 0402 12/29/19 0415 12/30/19 0507 12/31/19 0418  WBC 19.9*  16.2* 14.3* 14.4* 12.7*  HGB 14.1 14.2 14.6 13.9 14.3  HCT 44.2 43.9 44.8 42.8 44.0  MCV 92.7 91.1 90.9 91.6 91.5  PLT 281 247 239 220 209    Basic Metabolic Panel: Recent Labs  Lab 12/27/19 0922 12/28/19 0402 12/29/19 0415 12/30/19 0507 12/31/19 0418  NA 136 140 142 143 142  K 4.0 4.3 3.9 4.1 4.5  CL 97* 100 100 104 102  CO2 27 27 27 29 25   GLUCOSE 274* 240* 218* 183* 237*  BUN 19 23* 36* 48* 56*  CREATININE 0.76 0.73 0.88 0.91 1.06*  CALCIUM 8.0* 8.6* 8.5* 8.6* 8.7*  MG  --   --   --   --  2.8*  PHOS  --   --   --   --  4.3   GFR: Estimated Creatinine Clearance: 68.8 mL/min (A) (by C-G formula based on SCr of 1.06 mg/dL (H)). Recent Labs  Lab 12/25/19 0106 12/26/19 0500 12/28/19 0402 12/29/19 0415 12/30/19 0507 12/31/19 0418  PROCALCITON <0.10  --   --  0.20  --   --   WBC 13.2*   < > 16.2* 14.3* 14.4* 12.7*   < > = values in this interval not displayed.    Liver Function Tests: Recent Labs  Lab 12/27/19 0922 12/28/19 0402 12/29/19 0415 12/30/19 0507 12/31/19 0418  AST 31 33 86* 34 37  ALT 45* 57* 121* 104* 105*  ALKPHOS 79 86 100 87 89  BILITOT 0.7 0.9 0.8 0.8 0.6  PROT 5.7* 6.0* 6.2* 6.0* 6.2*  ALBUMIN 2.3* 2.3* 2.3* 2.3* 2.6*   No results for input(s): LIPASE, AMYLASE in the last 168 hours. No results for input(s): AMMONIA in the last 168 hours.  ABG No results found for: PHART, PCO2ART, PO2ART, HCO3, TCO2, ACIDBASEDEF, O2SAT   Coagulation Profile: No results for input(s): INR, PROTIME in the last 168 hours.  Cardiac Enzymes: No results for input(s): CKTOTAL, CKMB, CKMBINDEX, TROPONINI in the last 168 hours.  HbA1C: Hgb A1c MFr Bld  Date/Time Value Ref Range Status  12/27/2019 03:44 PM 10.8 (H) 4.8 - 5.6 % Final    Comment:    (NOTE) Pre diabetes:          5.7%-6.4%  Diabetes:              >6.4%  Glycemic control for   <7.0% adults with diabetes     CBG: Recent Labs  Lab 12/30/19 1601 12/30/19 1959 12/30/19 2359  12/31/19 0341 12/31/19 0753  GLUCAP 204* 265* 234* 207* 215*    CRITICAL CARE Performed by: Lanier Clam   Total critical care time: 38 minutes  Critical care time was exclusive of separately billable procedures and treating other patients. Critical care was necessary to treat or prevent imminent or life-threatening deterioration.  Critical care was time spent personally by me on the following activities: development of treatment plan with patient and/or surrogate as well as nursing, discussions with consultants, evaluation of patient's response to treatment, examination of patient, obtaining history from patient or surrogate, ordering and performing treatments and interventions, ordering and review of laboratory studies, ordering and review of radiographic studies, pulse oximetry and re-evaluation of patient's condition.  Tessie Fass MSN, AGACNP-BC [place] Pulmonary/Critical Care Medicine [phone] If no answer, [phone] 12/31/2019, 8:55 AM

## 2019-12-31 NOTE — Progress Notes (Signed)
Ok to dc hydroxyzine due to duplicate with alprazolam per Delight Stare.   Ulyses Southward, PharmD, BCIDP, AAHIVP, CPP Infectious Disease Pharmacist 12/31/2019 8:24 AM

## 2020-01-01 ENCOUNTER — Inpatient Hospital Stay: Payer: Self-pay

## 2020-01-01 ENCOUNTER — Inpatient Hospital Stay (HOSPITAL_COMMUNITY): Payer: 59

## 2020-01-01 DIAGNOSIS — J9621 Acute and chronic respiratory failure with hypoxia: Secondary | ICD-10-CM

## 2020-01-01 LAB — COMPREHENSIVE METABOLIC PANEL
ALT: 106 U/L — ABNORMAL HIGH (ref 0–44)
AST: 43 U/L — ABNORMAL HIGH (ref 15–41)
Albumin: 2.6 g/dL — ABNORMAL LOW (ref 3.5–5.0)
Alkaline Phosphatase: 102 U/L (ref 38–126)
Anion gap: 17 — ABNORMAL HIGH (ref 5–15)
BUN: 62 mg/dL — ABNORMAL HIGH (ref 6–20)
CO2: 24 mmol/L (ref 22–32)
Calcium: 8.7 mg/dL — ABNORMAL LOW (ref 8.9–10.3)
Chloride: 102 mmol/L (ref 98–111)
Creatinine, Ser: 1.07 mg/dL — ABNORMAL HIGH (ref 0.44–1.00)
GFR calc Af Amer: >60 mL/min (ref >60)
GFR calc non Af Amer: 58 mL/min — ABNORMAL LOW (ref >60)
Glucose, Bld: 314 mg/dL — ABNORMAL HIGH (ref 70–99)
Potassium: 4.1 mmol/L (ref 3.5–5.1)
Sodium: 143 mmol/L (ref 135–145)
Total Bilirubin: 1.3 mg/dL — ABNORMAL HIGH (ref 0.3–1.2)
Total Protein: 6.1 g/dL — ABNORMAL LOW (ref 6.5–8.1)

## 2020-01-01 LAB — POCT I-STAT 7, (LYTES, BLD GAS, ICA,H+H)
Acid-Base Excess: 4 mmol/L — ABNORMAL HIGH (ref 0.0–2.0)
Bicarbonate: 32.9 mmol/L — ABNORMAL HIGH (ref 20.0–28.0)
Calcium, Ion: 1.2 mmol/L (ref 1.15–1.40)
HCT: 43 % (ref 36.0–46.0)
Hemoglobin: 14.6 g/dL (ref 12.0–15.0)
O2 Saturation: 97 %
Patient temperature: 97.9
Potassium: 4.3 mmol/L (ref 3.5–5.1)
Sodium: 144 mmol/L (ref 135–145)
TCO2: 35 mmol/L — ABNORMAL HIGH (ref 22–32)
pCO2 arterial: 68 mmHg (ref 32.0–48.0)
pH, Arterial: 7.291 — ABNORMAL LOW (ref 7.350–7.450)
pO2, Arterial: 105 mmHg (ref 83.0–108.0)

## 2020-01-01 LAB — CBC
HCT: 44.1 % (ref 36.0–46.0)
Hemoglobin: 14.2 g/dL (ref 12.0–15.0)
MCH: 29.5 pg (ref 26.0–34.0)
MCHC: 32.2 g/dL (ref 30.0–36.0)
MCV: 91.5 fL (ref 80.0–100.0)
Platelets: 186 10*3/uL (ref 150–400)
RBC: 4.82 MIL/uL (ref 3.87–5.11)
RDW: 13.6 % (ref 11.5–15.5)
WBC: 13.5 10*3/uL — ABNORMAL HIGH (ref 4.0–10.5)
nRBC: 0 % (ref 0.0–0.2)

## 2020-01-01 LAB — GLUCOSE, CAPILLARY
Glucose-Capillary: 292 mg/dL — ABNORMAL HIGH (ref 70–99)
Glucose-Capillary: 290 mg/dL — ABNORMAL HIGH (ref 70–99)
Glucose-Capillary: 283 mg/dL — ABNORMAL HIGH (ref 70–99)
Glucose-Capillary: 312 mg/dL — ABNORMAL HIGH (ref 70–99)
Glucose-Capillary: 256 mg/dL — ABNORMAL HIGH (ref 70–99)

## 2020-01-01 LAB — C-REACTIVE PROTEIN: CRP: 1.8 mg/dL — ABNORMAL HIGH (ref <1.0)

## 2020-01-01 LAB — D-DIMER, QUANTITATIVE: D-Dimer, Quant: 0.42 ug/mL-FEU (ref 0.00–0.50)

## 2020-01-01 LAB — PHOSPHORUS: Phosphorus: 4.1 mg/dL (ref 2.5–4.6)

## 2020-01-01 LAB — SAR COV2 SEROLOGY (COVID19)AB(IGG),IA: SARS-CoV-2 Ab, IgG: REACTIVE — AB

## 2020-01-01 MED ORDER — FENTANYL CITRATE (PF) 100 MCG/2ML IJ SOLN: 50.0000 ug | INTRAMUSCULAR | Status: DC | PRN

## 2020-01-01 MED ORDER — SODIUM BICARBONATE 8.4 % IV SOLN
INTRAVENOUS | Status: AC
  Filled 2020-01-01: qty 50

## 2020-01-01 MED ORDER — FENTANYL CITRATE (PF) 100 MCG/2ML IJ SOLN
INTRAMUSCULAR | Status: AC
  Filled 2020-01-01: qty 2

## 2020-01-01 MED ORDER — ORAL CARE MOUTH RINSE
15.0000 mL | OROMUCOSAL | Status: DC
  Administered 2020-01-01 – 2020-01-14 (×122): 15 mL via OROMUCOSAL

## 2020-01-01 MED ORDER — FENTANYL 2500MCG IN NS 250ML (10MCG/ML) PREMIX INFUSION
0.0000 ug/h | INTRAVENOUS | Status: DC
  Administered 2020-01-01 (×2): 300 ug/h via INTRAVENOUS
  Administered 2020-01-02: 100 ug/h via INTRAVENOUS
  Administered 2020-01-03 (×2): 150 ug/h via INTRAVENOUS
  Administered 2020-01-04: 200 ug/h via INTRAVENOUS
  Administered 2020-01-04: 150 ug/h via INTRAVENOUS
  Administered 2020-01-05: 300 ug/h via INTRAVENOUS
  Administered 2020-01-05 – 2020-01-06 (×3): 250 ug/h via INTRAVENOUS
  Administered 2020-01-07: 200 ug/h via INTRAVENOUS
  Administered 2020-01-07: 250 ug/h via INTRAVENOUS
  Administered 2020-01-08 – 2020-01-09 (×3): 200 ug/h via INTRAVENOUS
  Administered 2020-01-10 – 2020-01-11 (×4): 250 ug/h via INTRAVENOUS
  Filled 2020-01-01 (×20): qty 250

## 2020-01-01 MED ORDER — MIDAZOLAM HCL 2 MG/2ML IJ SOLN
2.0000 mg | INTRAMUSCULAR | Status: DC | PRN
  Administered 2020-01-03 – 2020-01-04 (×2): 2 mg via INTRAVENOUS
  Filled 2020-01-01 (×2): qty 2

## 2020-01-01 MED ORDER — POLYETHYLENE GLYCOL 3350 17 G PO PACK
17.0000 g | PACK | Freq: Every day | ORAL | Status: DC
  Administered 2020-01-05 – 2020-01-13 (×8): 17 g
  Filled 2020-01-01 (×8): qty 1

## 2020-01-01 MED ORDER — METHYLPREDNISOLONE SODIUM SUCC 125 MG IJ SOLR
60.0000 mg | INTRAMUSCULAR | Status: DC
  Administered 2020-01-02 – 2020-01-03 (×2): 60 mg via INTRAVENOUS
  Filled 2020-01-01 (×2): qty 2

## 2020-01-01 MED ORDER — FENTANYL 2500MCG IN NS 250ML (10MCG/ML) PREMIX INFUSION
50.0000 ug/h | INTRAVENOUS | Status: DC
  Filled 2020-01-01: qty 250

## 2020-01-01 MED ORDER — FENTANYL CITRATE (PF) 100 MCG/2ML IJ SOLN: 50.0000 ug | Freq: Once | INTRAMUSCULAR | Status: AC

## 2020-01-01 MED ORDER — SODIUM CHLORIDE 0.9% FLUSH
10.0000 mL | Freq: Two times a day (BID) | INTRAVENOUS | Status: DC
  Administered 2020-01-01: 10 mL
  Administered 2020-01-01: 30 mL
  Administered 2020-01-02 – 2020-01-14 (×22): 10 mL

## 2020-01-01 MED ORDER — SODIUM CHLORIDE 0.9% FLUSH: 10.0000 mL | INTRAVENOUS | Status: DC | PRN

## 2020-01-01 MED ORDER — FENTANYL CITRATE (PF) 100 MCG/2ML IJ SOLN
50.0000 ug | INTRAMUSCULAR | Status: DC | PRN
  Administered 2020-01-04: 100 ug via INTRAVENOUS

## 2020-01-01 MED ORDER — DOCUSATE SODIUM 50 MG/5ML PO LIQD
100.0000 mg | Freq: Two times a day (BID) | ORAL | Status: DC
  Administered 2020-01-01 – 2020-01-13 (×23): 100 mg
  Filled 2020-01-01 (×25): qty 10

## 2020-01-01 MED ORDER — MIDAZOLAM HCL 2 MG/2ML IJ SOLN
INTRAMUSCULAR | Status: AC
  Filled 2020-01-01: qty 2

## 2020-01-01 MED ORDER — CHLORHEXIDINE GLUCONATE 0.12% ORAL RINSE (MEDLINE KIT)
15.0000 mL | Freq: Two times a day (BID) | OROMUCOSAL | Status: DC
  Administered 2020-01-01 – 2020-01-14 (×25): 15 mL via OROMUCOSAL

## 2020-01-01 MED ORDER — FENTANYL BOLUS VIA INFUSION
50.0000 ug | INTRAVENOUS | Status: DC | PRN
  Administered 2020-01-02: 50 ug via INTRAVENOUS
  Administered 2020-01-02: 25 ug via INTRAVENOUS
  Administered 2020-01-02 – 2020-01-05 (×7): 50 ug via INTRAVENOUS
  Filled 2020-01-01: qty 50

## 2020-01-01 MED ORDER — MIDAZOLAM HCL 2 MG/2ML IJ SOLN
2.0000 mg | INTRAMUSCULAR | Status: AC | PRN
  Administered 2020-01-03 – 2020-01-04 (×3): 2 mg via INTRAVENOUS
  Filled 2020-01-01 (×3): qty 2

## 2020-01-01 NOTE — Procedures (Signed)
Intubation Procedure Note  AYRIEL TEXIDOR  [id number]  04/07/1966  Date:01/01/20  Time:11:32 AM   Provider Performing:Sofie Schendel    Procedure: Intubation (31500)  Indication(s) Respiratory Failure  Consent Risks of the procedure as well as the alternatives and risks of each were explained to the caregiver.  verbral informed Consent for the procedure was obtaine  Anesthesia Etomidate, Versed, Fentanyl, Rocuronium and bic bolus and neo stick after intubation   Time Out Verified patient identification, verified procedure, site/side was marked, verified correct patient position, special equipment/implants available, medications/allergies/relevant history reviewed, required imaging and test results available.   Sterile Technique Usual hand hygeine, masks, and gloves were used   Procedure Description Patient positioned in bed supine.  Sedation given as noted above.  Patient was intubated with endotracheal tube using Glidescope.  View was Grade 1 full glottis .  Number of attempts was 1.  Colorimetric CO2 detector was consistent with tracheal placement.   Complications/Tolerance None; patient tolerated the procedure well. Chest X-ray is ordered to verify placement.   EBL NONE   Specimen(s) None      SIGNATURE    Dr. Kalman Shan, M.D., F.C.C.P,  Pulmonary and Critical Care Medicine Staff Physician, [facility] Director - Interstitial Lung Disease  Program  Pulmonary Fibrosis [facility] Network at [facility] [place], [place], [address]  Pager: [phone], If no answer  OR between  19:00-7:00h: page [phone] Telephone (clinical office): [phone] Telephone (research): [phone]  11:33 AM 01/01/2020

## 2020-01-01 NOTE — Progress Notes (Signed)
Peripherally Inserted Central Catheter Placement  The IV Nurse has discussed with the patient and/or persons authorized to consent for the patient, the purpose of this procedure and the potential benefits and risks involved with this procedure.  The benefits include less needle sticks, lab draws from the catheter, and the patient may be discharged home with the catheter. Risks include, but not limited to, infection, bleeding, blood clot (thrombus formation), and puncture of an artery; nerve damage and irregular heartbeat and possibility to perform a PICC exchange if needed/ordered by physician.  Alternatives to this procedure were also discussed.  Bard Power PICC patient education guide, fact sheet on infection prevention and patient information card has been provided to patient /or left at bedside.    PICC Placement Documentation  PICC Triple Lumen 01/01/20 PICC Right Cephalic 38 cm 0 cm (Active)  Indication for Insertion or Continuance of Line Prolonged intravenous therapies 01/01/20 1341  Exposed Catheter (cm) 0 cm 01/01/20 1341  Site Assessment Clean;Dry;Intact 01/01/20 1341  Lumen #1 Status Flushed;Saline locked;Blood return noted 01/01/20 1341  Lumen #2 Status Flushed;Saline locked;Blood return noted 01/01/20 1341  Lumen #3 Status Flushed;Saline locked;Blood return noted 01/01/20 1341  Dressing Type Transparent 01/01/20 1341  Dressing Status Clean;Dry;Intact 01/01/20 1341  Antimicrobial disc in place? Yes 01/01/20 1341  Safety Lock Not Applicable 01/01/20 1341  Line Care Connections checked and tightened 01/01/20 1341  Line Adjustment (NICU/IV Team Only) No 01/01/20 1341  Dressing Intervention New dressing 01/01/20 1341  Dressing Change Due 01/08/20 01/01/20 1341       Nikeisha Klutz, Lajean Manes 01/01/2020, 1:42 PM

## 2020-01-01 NOTE — Progress Notes (Signed)
° °  Per RN - patient convered to NSR after intubation At home she did not have A fib Also daughter concerned if lovenox would increase risk for rrupture of known bladder cysts v intersitial cystituis (oper RN)  Plan  - dc cardizem gtt  - holding lovenox Rx dose due to epistaxis anyways - reassess neeed 01/02/20 - lopersssor prn IV    SIGNATURE    Dr. Kalman Shan, M.D., F.C.C.P,  Pulmonary and Critical Care Medicine Staff Physician, [facility] Center Director - Interstitial Lung Disease  Program  Pulmonary Fibrosis [facility] Network at [facility] [place], [place], [address]  Pager: [phone], If no answer  OR between  19:00-7:00h: page [phone] Telephone (clinical office): [phone] Telephone (research): [phone]  4:39 PM 01/01/2020

## 2020-01-01 NOTE — Progress Notes (Addendum)
NAME:  Linda Richmond, MRN:  [id number], DOB:  [birth year], LOS: 22 ADMISSION DATE:  01/01/2020, CONSULTATION DATE:  12/28/2019 REFERRING MD:  Dr. Jerral Ralph, CHIEF COMPLAINT:  Hypoxic resp failure/ COVID  Brief History    54 year old female with history of DMT2, HTN, seronegative RA, anxiety, and depression who initially presented to [facility] on 9/3 with one week history of generalized body aches, headache, nasal congestion, and progressive shortness of breath.  She is unvaccinated.  Found to be COVID positive with saturations in the mid 80's on room air and CXR significant for multifocal opacities.  She was admitted by [facility].  On 9/6 she was transferred to [facility] given her increasing O2 requirements on NRB.  Hospitalization since complicated by Afib with RVR, and worsening respiratory failure.  Now unable to tolerate HHFNC beyond 50L and NRB with saturations in the high 70s/80's.  PCCM consulted for ICU transfer and likely BiPAP.  Past Medical History  DMT2, HTN, seronegative RA, anxiety, depression  Significant Hospital Events   9/3 Admitted to [facility] 9/6 Transferred to [facility] given worsening hypoxemia/ NRB 15L 9/8 Afib w/ RVR 9/19 cefepime added due to worsening leukocytosis/ persistent infiltrates  9/21 transitioned to [facility] and NRB/ PCCM consulted  9/24 - Tolerated day shift off of BiPAP . Tolerated BiPAP well at night. Tolerating clear liquids and feels ready to eat . Fialed cortrak with desats and epistaxis. Lovenox held  Consults:  Cardiology 9/9 PCCM  9/21  Procedures:  9/16 PICC > single-lumen only  Significant Diagnostic Tests:  9/3>>Chest x-ray: New bilateral mid to lower lung patchy opacities 9/9>> Echo: EF 60-65% 9/16>> chest x-ray: Airspace opacity throughout mid/lower lungs 9/16>> chest x-ray: Interval development of pneumomediastinum 9/17>> chest x-ray: Pneumomediastinum, tiny left apical pneumothorax-no change in bilateral airspace opacities 9/18>> chest x-ray: Stable extensive patchy  hazy opacities-no pneumothorax. 9/19>> chest x-ray: Persistent airspace opacities over mid to lower lungs bilaterally. 9/20>> chest x-ray: Persistent streaky infiltrates to bilateral lungs. 9/24 CXR> stable low lung volumes, but with some interval improvement to bilateral opacities   Micro Data:  9/3 SARS2 >> positive  9/3 BCx 2 >> staph epi 1/2 9/13 MRSA PCR >> neg  Antimicrobials:  9/19 cefepime >>  baricitinib 9/5 >> 9/18  Decadron 9/3 > 9/4 Solumedrol 9/5 > 9/15; 9/20 > remdesivir 9/3 > 9/7  Interim history/subjective:   9/25 -marked deterioration in respiratory status.  She is fatigued.  She is also confused but intermittely oriented. Moves all 4s.  Daughter at the bedside.  Daughter reports full code and expressed will to get better.  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  Daughter agreeable to intubation. Lovenozx on hold due to epistaxis and conj hge. No active bleeding.    Objective   Blood pressure 119/86, pulse 97, temperature (!) 97.1 F (36.2 C), temperature source Axillary, resp. rate 16, height 5\' 2"  (1.575 m), weight 111.1 kg, last menstrual period 04/18/2018, SpO2 (!) 84 %.    FiO2 (%):  [100 %] 100 %   Intake/Output Summary (Last 24 hours) at 01/01/2020 1008 Last data filed at 12/31/2019 2300 Gross per 24 hour  Intake 272.1 ml  Output 500 ml  Net -227.9 ml   Filed Weights   12/30/19 0500 12/31/19 0500 01/01/20 0431  Weight: 112.7 kg 108.7 kg 111.1 kg   Examination: Obese female.  On facemask nonrebreather.  Marked change in status.  She is drowsy but intermittenly  conversing with family  She is lying in bed.  Clear to auscultation.  Respiratory rate is slowing down.  Normal heart sounds abdomen soft.  She has single-lumen PICC.  Resolved Hospital Problem list      Assessment & Plan:  OSA noncompliant with CPAP Acute hypoxic respiratory failure due to ARDS from COVID 19  pneumonia   01/01/2020  -meets intubation criteria  Plan -Intubate and mechanically ventilate -ARDS protocol -Vent bundle  COVID-19  01/01/2020: out of infectious contagion perid  Plan -Reduce Solu-Medrol from 60 mg twice daily to 60 mg once daily  -We will work with pharmacy to set a weaning schedule and taper it off    Paroxysmal A. fib  - 01/01/2020  -> heart rate 121 with A. fib.  Maintaining blood pressure  Plan - Scheduled Dilt infusion - PRN metop   -Full dose lovenox   AKI,  01/01/2020: Mild and prerenal probably from Lasix  PLAN  -Monitor  Anxiety   Sleep disturbance   01/01/2020 -currently obtunded and has acute encephalopathy due to progressive respiratory failure  Plan -Sedation protocol with mechanical ventilation  Hypothyroidism  Continue daily armour    Poorly controlled diabetes  01/01/2020 - ON SSI.  Anticipate sugar to go up once on tube feeds  PLAN  Levemir 20 units BID   sliding scale insulin Tradjenta 5mg  daily -     Best practice:  Diet: N.p.o. and then tube feeds   Pain/Anxiety/Delirium protocol (if indicated): PAD protocol   VAP protocol (if indicated): n/a  DVT prophylaxis: lovenox full dose for A. Fib - > ON HOLD DUE TO EPISTAXIS since 9/24. Aim to restart 9/26  GI prophylaxis: H2 blockade   glucose control: SSI as above   mobility: Bedrest   Code Status: full   Family Communication: 9.25_ Daughter at the bedside and son over telephone. Son asked aboput prognosis - explained high risk for mortality in ICU and if survives very likely looking at LTAC/Trach and attendant chronic critical illness -> he expressed that patient was never a fan of DNR and wants "everything done" and f"fighting to end". For now full code.   Disposition: ICU      ATTESTATION & SIGNATURE   The patient Linda Richmond is critically ill with multiple organ systems failure and requires high complexity decision making for assessment and  support, frequent evaluation and titration of therapies, application of advanced monitoring technologies and extensive interpretation of multiple databases.   Critical Care Time devoted to patient care services described in this note is  45  Minutes. This time reflects time of care of this signee Dr Standley Dakins. This critical care time does not reflect procedure time, or teaching time or supervisory time of PA/NP/Med student/Med Resident etc but could involve care discussion time     Dr. Kalman Shan, M.D., [facility].C.P Pulmonary and Critical Care Medicine Staff Physician [facility] [place] Pulmonary and Critical Care Pager: [phone], If no answer or between  15:00h - 7:00h: call [phone]  01/01/2020 10:24 AM     LABS    PULMONARY No results for input(s): PHART, PCO2ART, PO2ART, HCO3, TCO2, O2SAT in the last 168 hours.  Invalid input(s): PCO2, PO2  CBC Recent Labs  Lab 12/30/19 0507 12/31/19 0418 01/01/20 0433  HGB 13.9 14.3 14.2  HCT 42.8 44.0 44.1  WBC 14.4* 12.7* 13.5*  PLT 220 209 186    COAGULATION No results for input(s): INR in the last 168  hours.  CARDIAC  No results for input(s): TROPONINI in the last 168 hours. No results for input(s): PROBNP in the last 168 hours.   CHEMISTRY Recent Labs  Lab 12/28/19 0402 12/28/19 0402 12/29/19 0415 12/29/19 0415 12/30/19 0507 12/30/19 0507 12/31/19 0418 01/01/20 0433  NA 140  --  142  --  143  --  142 143  K 4.3   < > 3.9   < > 4.1   < > 4.5 4.1  CL 100  --  100  --  104  --  102 102  CO2 27  --  27  --  29  --  25 24  GLUCOSE 240*  --  218*  --  183*  --  237* 314*  BUN 23*  --  36*  --  48*  --  56* 62*  CREATININE 0.73  --  0.88  --  0.91  --  1.06* 1.07*  CALCIUM 8.6*  --  8.5*  --  8.6*  --  8.7* 8.7*  MG  --   --   --   --   --   --  2.8*  --   PHOS  --   --   --   --   --   --  4.3 4.1   < > = values in this interval not displayed.   Estimated Creatinine Clearance: 69  mL/min (A) (by C-G formula based on SCr of 1.07 mg/dL (H)).   LIVER Recent Labs  Lab 12/28/19 0402 12/29/19 0415 12/30/19 0507 12/31/19 0418 01/01/20 0433  AST 33 86* 34 37 43*  ALT 57* 121* 104* 105* 106*  ALKPHOS 86 100 87 89 102  BILITOT 0.9 0.8 0.8 0.6 1.3*  PROT 6.0* 6.2* 6.0* 6.2* 6.1*  ALBUMIN 2.3* 2.3* 2.3* 2.6* 2.6*     INFECTIOUS Recent Labs  Lab 12/29/19 0415  PROCALCITON 0.20     ENDOCRINE CBG (last 3)  Recent Labs    12/31/19 2330 01/01/20 0324 01/01/20 0751  GLUCAP 272* 292* 290*         IMAGING x48h  - image(s) personally visualized  -   highlighted in bold DG CHEST PORT 1 VIEW  Result Date: 12/31/2019 CLINICAL DATA:  Acute on chronic respiratory failure EXAM: PORTABLE CHEST 1 VIEW COMPARISON:  12/28/2018 FINDINGS: Lung volumes are small, but are symmetric and are stable since prior examination. Superimposed mid and lower lung zone airspace infiltrate has improved slightly since prior examination. No pneumothorax or pleural effusion. Right upper extremity PICC line tip seen within the right atrium. Cardiac size within normal limits. Pulmonary vascularity is normal. IMPRESSION: Stable pulmonary hypoinflation. Improving bibasilar pulmonary infiltrates. Moderate infiltrate persists. Electronically Signed   By: Helyn Numbers MD   On: 12/31/2019 06:53  ;Vertis Kelch

## 2020-01-02 ENCOUNTER — Inpatient Hospital Stay (HOSPITAL_COMMUNITY): Payer: 59

## 2020-01-02 DIAGNOSIS — J1282 Pneumonia due to Coronavirus disease 2019: Secondary | ICD-10-CM | POA: Diagnosis not present

## 2020-01-02 DIAGNOSIS — J80 Acute respiratory distress syndrome: Secondary | ICD-10-CM | POA: Diagnosis not present

## 2020-01-02 DIAGNOSIS — Z66 Do not resuscitate: Secondary | ICD-10-CM | POA: Diagnosis not present

## 2020-01-02 LAB — GLUCOSE, CAPILLARY
Glucose-Capillary: 120 mg/dL — ABNORMAL HIGH (ref 70–99)
Glucose-Capillary: 161 mg/dL — ABNORMAL HIGH (ref 70–99)
Glucose-Capillary: 164 mg/dL — ABNORMAL HIGH (ref 70–99)
Glucose-Capillary: 281 mg/dL — ABNORMAL HIGH (ref 70–99)
Glucose-Capillary: 353 mg/dL — ABNORMAL HIGH (ref 70–99)
Glucose-Capillary: 379 mg/dL — ABNORMAL HIGH (ref 70–99)
Glucose-Capillary: 96 mg/dL (ref 70–99)

## 2020-01-02 LAB — PHOSPHORUS: Phosphorus: 6.7 mg/dL — ABNORMAL HIGH (ref 2.5–4.6)

## 2020-01-02 LAB — C-REACTIVE PROTEIN: CRP: 2.5 mg/dL — ABNORMAL HIGH (ref <1.0)

## 2020-01-02 MED ORDER — BUSPIRONE HCL 15 MG PO TABS
15.0000 mg | ORAL_TABLET | Freq: Three times a day (TID) | ORAL | Status: DC
  Administered 2020-01-02 – 2020-01-14 (×37): 15 mg
  Filled 2020-01-02 (×37): qty 1

## 2020-01-02 MED ORDER — VITAL HIGH PROTEIN PO LIQD
1000.0000 mL | ORAL | Status: DC
  Administered 2020-01-02 – 2020-01-03 (×3): 1000 mL
  Filled 2020-01-02: qty 1000

## 2020-01-02 MED ORDER — DILTIAZEM HCL-DEXTROSE 125-5 MG/125ML-% IV SOLN (PREMIX)
5.0000 mg/h | INTRAVENOUS | Status: DC
  Administered 2020-01-02: 13 mg/h via INTRAVENOUS
  Administered 2020-01-02: 5 mg/h via INTRAVENOUS
  Administered 2020-01-03 (×2): 15 mg/h via INTRAVENOUS
  Administered 2020-01-04: 14 mg/h via INTRAVENOUS
  Administered 2020-01-04: 5 mg/h via INTRAVENOUS
  Filled 2020-01-02 (×6): qty 125

## 2020-01-02 MED ORDER — ENOXAPARIN SODIUM 60 MG/0.6ML ~~LOC~~ SOLN
55.0000 mg | SUBCUTANEOUS | Status: DC
  Administered 2020-01-02 – 2020-01-13 (×12): 55 mg via SUBCUTANEOUS
  Filled 2020-01-02 (×12): qty 0.55

## 2020-01-02 MED ORDER — THYROID 30 MG PO TABS
30.0000 mg | ORAL_TABLET | Freq: Every day | ORAL | Status: DC
  Administered 2020-01-03 – 2020-01-14 (×12): 30 mg
  Filled 2020-01-02 (×13): qty 1

## 2020-01-02 MED ORDER — PROSOURCE TF PO LIQD
45.0000 mL | Freq: Two times a day (BID) | ORAL | Status: DC
  Administered 2020-01-02 – 2020-01-03 (×3): 45 mL
  Filled 2020-01-02 (×3): qty 45

## 2020-01-02 MED ORDER — LINAGLIPTIN 5 MG PO TABS
5.0000 mg | ORAL_TABLET | Freq: Every day | ORAL | Status: DC
  Administered 2020-01-02 – 2020-01-14 (×13): 5 mg
  Filled 2020-01-02 (×13): qty 1

## 2020-01-02 MED ORDER — DILTIAZEM 12 MG/ML ORAL SUSPENSION
30.0000 mg | Freq: Four times a day (QID) | ORAL | Status: DC
  Administered 2020-01-02: 30 mg
  Filled 2020-01-02 (×2): qty 3

## 2020-01-02 MED ORDER — DILTIAZEM HCL ER 60 MG PO CP12
60.0000 mg | ORAL_CAPSULE | Freq: Two times a day (BID) | ORAL | Status: DC
  Filled 2020-01-02: qty 1

## 2020-01-02 MED ORDER — ALPRAZOLAM 0.25 MG PO TABS
0.2500 mg | ORAL_TABLET | Freq: Every evening | ORAL | Status: DC | PRN
  Administered 2020-01-03: 0.5 mg
  Filled 2020-01-02: qty 2

## 2020-01-02 MED ORDER — ESCITALOPRAM OXALATE 10 MG PO TABS
5.0000 mg | ORAL_TABLET | Freq: Every day | ORAL | Status: DC
  Administered 2020-01-02 – 2020-01-14 (×13): 5 mg
  Filled 2020-01-02 (×13): qty 1

## 2020-01-02 MED ORDER — FAMOTIDINE 40 MG/5ML PO SUSR
20.0000 mg | Freq: Two times a day (BID) | ORAL | Status: DC
  Administered 2020-01-02 – 2020-01-14 (×25): 20 mg
  Filled 2020-01-02 (×25): qty 2.5

## 2020-01-02 MED ORDER — LACTATED RINGERS IV BOLUS
500.0000 mL | Freq: Once | INTRAVENOUS | Status: AC
  Administered 2020-01-02: 500 mL via INTRAVENOUS

## 2020-01-02 NOTE — Progress Notes (Signed)
Brief Nutrition Note  Consult received for enteral/tube feeding initiation and management.  Adult Enteral Nutrition Protocol initiated. Follow up nutrition assessment to follow. OG placed after intubation yesterday. Per RN, NGT placement attempt yesterday unsuccessful due to multiple blood clots.   Admitting Dx: Hypoxia [R09.02] Pneumonia due to COVID-19 virus [U07.1, J12.82] Acute respiratory disease due to COVID-19 virus [U07.1, J06.9]  Labs:  Recent Labs  Lab 12/30/19 0507 12/30/19 0507 12/31/19 0418 01/01/20 0433 01/01/20 1231 01/02/20 0343  NA 143   < > 142 143 144  --   K 4.1   < > 4.5 4.1 4.3  --   CL 104  --  102 102  --   --   CO2 29  --  25 24  --   --   BUN 48*  --  56* 62*  --   --   CREATININE 0.91  --  1.06* 1.07*  --   --   CALCIUM 8.6*  --  8.7* 8.7*  --   --   MG  --   --  2.8*  --   --   --   PHOS  --   --  4.3 4.1  --  6.7*  GLUCOSE 183*  --  237* 314*  --   --    < > = values in this interval not displayed.    Roslyn Smiling, MS, RD, LDN RD pager number/after hours weekend pager number on Amion.

## 2020-01-02 NOTE — Progress Notes (Signed)
NAME:  Linda Richmond, MRN:  [id number], DOB:  01/05/1966, LOS: 23 ADMISSION DATE:  12/21/2019, CONSULTATION DATE:  12/28/2019 REFERRING MD:  Dr. Jerral Ralph, CHIEF COMPLAINT:  Hypoxic resp failure/ COVID  Brief History    54 year old female with history of DMT2, HTN, seronegative RA, anxiety, and depression who initially presented to [facility] on 9/3 with one week history of generalized body aches, headache, nasal congestion, and progressive shortness of breath.  She is unvaccinated.  Found to be COVID positive with saturations in the mid 80's on room air and CXR significant for multifocal opacities.  She was admitted by [facility].  On 9/6 she was transferred to [facility] given her increasing O2 requirements on NRB.  Hospitalization since complicated by Afib with RVR, and worsening respiratory failure.  Now unable to tolerate HHFNC beyond 50L and NRB with saturations in the high 70s/80's.  PCCM consulted for ICU transfer and likely BiPAP.  Past Medical History  DMT2, HTN, seronegative RA, anxiety, depression  Significant Hospital Events   9/3 Admitted to [facility] 9/6 Transferred to [facility] given worsening hypoxemia/ NRB 15L 9/8 Afib w/ RVR 9/19 cefepime added due to worsening leukocytosis/ persistent infiltrates  9/21 transitioned to [facility] and NRB/ PCCM consulted  9/24 - Tolerated day shift off of BiPAP . Tolerated BiPAP well at night. Tolerating clear liquids and feels ready to eat . Fialed cortrak with desats and epistaxis. Lovenox held 9/25  - INTUBATED  Consults:  Cardiology 9/9 PCCM  9/21  Procedures:  9/16 PICC > single-lumen only 9/26 - INTUBATION  Significant Diagnostic Tests:  9/3>>Chest x-ray: New bilateral mid to lower lung patchy opacities 9/9>> Echo: EF 60-65% 9/16>> chest x-ray: Airspace opacity throughout mid/lower lungs 9/16>> chest x-ray: Interval development of pneumomediastinum 9/17>> chest x-ray: Pneumomediastinum, tiny left apical pneumothorax-no change in bilateral airspace opacities 9/18>>  chest x-ray: Stable extensive patchy hazy opacities-no pneumothorax. 9/19>> chest x-ray: Persistent airspace opacities over mid to lower lungs bilaterally. 9/20>> chest x-ray: Persistent streaky infiltrates to bilateral lungs. 9/24 CXR> stable low lung volumes, but with some interval improvement to bilateral opacities   Micro Data:  9/3 SARS2 >> positive  9/3 BCx 2 >> staph epi 1/2 9/13 MRSA PCR >> neg  Antimicrobials:  9/19 cefepime >>  baricitinib 9/5 >> 9/18  Decadron 9/3 > 9/4 Solumedrol 9/5 > 9/15; 9/20 > remdesivir 9/3 > 9/7  Interim history/subjective:   9/26 - 70% fio2/peep 10  on vent. On fent gtt. Afebrile =-> pulse ox 90%.  Able to interact.  Not yet on tube feeds.  Converted back to sinus rhythm upon intubation yesterday.  Since then on normal sinus rhythm.  Cardizem infusion has been stopped.  Antihypertensives were held yesterday in the aftermath of intubation and.  Currently hypertensive and in sinus rhythm.   Afebrile  Evenly balanced since admission  No active bleeding but there is bruising in the abdominal wall lower part  Objective   Blood pressure (!) 143/96, pulse 96, temperature 98.3 F (36.8 C), temperature source Axillary, resp. rate (!) 21, height 5\' 2"  (1.575 m), weight 108.6 kg, last menstrual period 04/18/2018, SpO2 96 %.    Vent Mode: PRVC FiO2 (%):  [80 %-100 %] 80 % Set Rate:  [16 bmp-20 bmp] 20 bmp Vt Set:  [400 mL] 400 mL PEEP:  [10 cmH20] 10 cmH20 Plateau Pressure:  [18 cmH20-26 cmH20] 26 cmH20   Intake/Output Summary (Last 24 hours) at 01/02/2020 1010 Last data filed at 01/02/2020 1000 Gross per 24 hour  Intake 1054.51 ml  Output 1650 ml  Net -595.49 ml   Filed Weights   12/31/19 0500 01/01/20 0431 01/02/20 0400  Weight: 108.7 kg 111.1 kg 108.6 kg   Examination: General Appearance:  Looks criticall ill OBESE - yes Head:  Normocephalic, without obvious abnormality, atraumatic Eyes:  PERRL - yes, conjunctiva/corneas - muddy without  hermorrha     Ears:  Normal external ear canals, both ears Nose:  G tube - n Throat:  ETT TUBE - yes , OG tube - yes Neck:  Supple,  No enlargement/tenderness/nodules Lungs: Clear to auscultation bilaterally, Ventilator   Synchrony -mild dyssynchrony present Heart:  S1 and S2 normal, no murmur, CVP - no.  Pressors - no Abdomen:  Soft, no masses, no organomegaly Genitalia / Rectal:  Not done Extremities:  Extremities- intact Skin:  ntact in exposed areas . Sacral area - not examined Neurologic:  Sedation - fent gtt -> RASS - -1 . Moves all 4s - yes. CAM-ICU - neg . Orientation - yes      Resolved Hospital Problem list      Assessment & Plan:  OSA noncompliant with CPAP Acute hypoxic respiratory failure due to ARDS from COVID 19 pneumonia   01/02/2020  -on 70% oxygen and PEEP of 10.  On fentanyl infusion.  Mild dyssynchrony present but appears comfortable   Plan -PRVC at 8 cc/kg per ideal body weight -VAP bundle -Check repeat chest x-ray 01/02/2020 -Check blood gas 01/02/2020 -consider deeper sedation or Nimbex or prone ventilation depending on blood gas results  COVID-19  01/02/2020: out of infectious contagion perid  Plan -Reduced Solu-Medrol from 60 mg twice daily to 60 mg once daily  - on 01/01/20 -We will work with pharmacy to set a weaning schedule and taper it off    Paroxysmal A. Fib -onset 12/15/2019.  Resolved 01/01/2020 [echocardiogram 12/16/2019: EF 65%]  - 01/02/2020  -> in sinus and now hypotensive.  Off Cardizem infusion.  Off treatment dose Lovenox due to concerns of epistaxis on 941  Plan -Start oral scheduled Cardizem - PRN metop   -Restart prophylactic dose Lovenox 01/02/2020 -Continue telemetry monitoring -Might have to decide if she needs long-term anticoagulation because of atrial fibrillation for 2 and 1/2 weeks  AKI,  01/02/2020: Appears prerenal no overt GI bleeding but did have epistaxis and some abdominal bruising from Lovenox.  Nursing reporting  urine output is sluggish  PLAN -500 cc LR bolus   Anxiety   Sleep disturbance Sedation needs on the ventilator   01/02/2020 -RASS sedation score -1 on fentanyl infusion   Plan -Sedation protocol with mechanical ventilation -Okay for RASS sedation score 0 and -3 and maintain adequate synchrony   Hypothyroidism  Continue daily armour    Poorly controlled diabetes  01/02/2020 - ON SSI.  Anticipate sugar to go up once on tube feeds  PLAN  Levemir 20 units BID   sliding scale insulin Tradjenta 5mg  daily -  Vitamin D supplementation -currently 50,000 units once a week  Plan -Check vitamin D level 01/03/2020   Best practice:  Diet: N.p.o. and then tube feeds   Pain/Anxiety/Delirium protocol (if indicated): PAD protocol   VAP protocol (if indicated): n/a  DVT prophylaxis: lovenox full dose for A. Fib - > ON HOLD DUE TO EPISTAXIS since 9/24. Restarting proph dose 01/02/20  GI prophylaxis: H2 blockade   glucose control: SSI as above   mobility: Bedrest   Code Status: full - 9.25_ Daughter at the bedside and son over telephone.  Son asked aboput prognosis - explained high risk for mortality in ICU and if survives very likely looking at LTAC/Trach and attendant chronic critical illness -> he expressed that patient was never a fan of DNR and wants "everything done" and f"fighting to end". For now full code.     Family Communication:   9/26: Son Daveena Elmore updated. Says patient daughter wil be at bedside  Disposition: ICU     ATTESTATION & SIGNATURE   The patient REES MATURA is critically ill with multiple organ systems failure and requires high complexity decision making for assessment and support, frequent evaluation and titration of therapies, application of advanced monitoring technologies and extensive interpretation of multiple databases.   Critical Care Time devoted to patient care services described in this note is  35  Minutes. This time reflects time  of care of this signee Dr Kalman Shan. This critical care time does not reflect procedure time, or teaching time or supervisory time of PA/NP/Med student/Med Resident etc but could involve care discussion time     Dr. Kalman Shan, M.D., [facility].C.P Pulmonary and Critical Care Medicine Staff Physician [facility] Pager: [phone], If no answer or between  15:00h - 7:00h: call [phone]  01/02/2020 10:42 AM     LABS    PULMONARY Recent Labs  Lab 01/01/20 1231  PHART 7.291*  PCO2ART 68.0*  PO2ART 105  HCO3 32.9*  TCO2 35*  O2SAT 97.0    CBC Recent Labs  Lab 12/30/19 0507 12/30/19 0507 12/31/19 0418 01/01/20 0433 01/01/20 1231  HGB 13.9   < > 14.3 14.2 14.6  HCT 42.8   < > 44.0 44.1 43.0  WBC 14.4*  --  12.7* 13.5*  --   PLT 220  --  209 186  --    < > = values in this interval not displayed.    COAGULATION No results for input(s): INR in the last 168 hours.  CARDIAC  No results for input(s): TROPONINI in the last 168 hours. No results for input(s): PROBNP in the last 168 hours.   CHEMISTRY Recent Labs  Lab 12/28/19 0402 12/28/19 0402 12/29/19 0415 12/29/19 0415 12/30/19 0507 12/30/19 0507 12/31/19 0418 12/31/19 0418 01/01/20 0433 01/01/20 1231 01/02/20 0343  NA 140   < > 142  --  143  --  142  --  143 144  --   K 4.3   < > 3.9   < > 4.1   < > 4.5   < > 4.1 4.3  --   CL 100  --  100  --  104  --  102  --  102  --   --   CO2 27  --  27  --  29  --  25  --  24  --   --   GLUCOSE 240*  --  218*  --  183*  --  237*  --  314*  --   --   BUN 23*  --  36*  --  48*  --  56*  --  62*  --   --   CREATININE 0.73  --  0.88  --  0.91  --  1.06*  --  1.07*  --   --   CALCIUM 8.6*  --  8.5*  --  8.6*  --  8.7*  --  8.7*  --   --   MG  --   --   --   --   --   --  2.8*  --   --   --   --   PHOS  --   --   --   --   --   --  4.3  --  4.1  --  6.7*   < > = values in this interval not displayed.    Estimated Creatinine Clearance: 68.1 mL/min (A) (by C-G formula based on SCr of 1.07 mg/dL (H)).   LIVER Recent Labs  Lab 12/28/19 0402 12/29/19 0415 12/30/19 0507 12/31/19 0418 01/01/20 0433  AST 33 86* 34 37 43*  ALT 57* 121* 104* 105* 106*  ALKPHOS 86 100 87 89 102  BILITOT 0.9 0.8 0.8 0.6 1.3*  PROT 6.0* 6.2* 6.0* 6.2* 6.1*  ALBUMIN 2.3* 2.3* 2.3* 2.6* 2.6*     INFECTIOUS Recent Labs  Lab 12/29/19 0415  PROCALCITON 0.20     ENDOCRINE CBG (last 3)  Recent Labs    01/02/20 0026 01/02/20 0355 01/02/20 0751  GLUCAP 161* 120* 96         IMAGING x48h  - image(s) personally visualized  -   highlighted in bold DG CHEST PORT 1 VIEW  Result Date: 01/01/2020 CLINICAL DATA:  Check endotracheal tube placement EXAM: PORTABLE CHEST 1 VIEW COMPARISON:  Film from the previous day. FINDINGS: New endotracheal tube is noted with the tip in the left mainstem bronchus. This should be withdrawn at least 3.5 cm. Gastric catheter is noted within the stomach. Right-sided PICC line is seen in satisfactory position. Bibasilar infiltrates are again seen. IMPRESSION: Endotracheal tube in the left mainstem bronchus. This should be withdrawn as described above. Electronically Signed   By: Alcide Clever M.D.   On: 01/01/2020 12:35   DG CHEST PORT 1 VIEW  Result Date: 01/01/2020 CLINICAL DATA:  Check endotracheal tube placement EXAM: PORTABLE CHEST 1 VIEW COMPARISON:  01/01/20 FINDINGS: Cardiac shadow is stable. Gastric catheter is noted in the distal stomach. Endotracheal tube is noted at the level of the carina directed towards the left mainstem bronchus and should be withdrawn 2-3 cm. Right-sided PICC line is noted in satisfactory position. IMPRESSION: Endotracheal tube at the level of the carina extending into the left mainstem bronchus. This should be withdrawn somewhat. Electronically Signed   By: Alcide Clever M.D.   On: 01/01/2020 12:34   DG Abd Portable 1V  Result Date:  01/01/2020 CLINICAL DATA:  Check gastric catheter placement EXAM: PORTABLE ABDOMEN - 1 VIEW COMPARISON:  None. FINDINGS: Gastric catheter is noted with the tip in the distal stomach. Scattered large and small bowel gas is noted. IMPRESSION: Gastric catheter within the stomach. Electronically Signed   By: Alcide Clever M.D.   On: 01/01/2020 12:31   [place] EKG SITE RITE  Result Date: 01/01/2020 If Site Rite image not attached, placement could not be confirmed due to current cardiac rhythm. ;ab

## 2020-01-02 NOTE — Progress Notes (Signed)
   At the bedside.  Nursing reports recurrence of tachycardia.  2 different EKGs seems to be in and out of A. fib  Plan -Discontinue oral Cardizem but replace it with infusion Cardizem     SIGNATURE    Dr. Kalman Shan, M.D., F.C.C.P,  Pulmonary and Critical Care Medicine Staff Physician, [facility] Director - Interstitial Lung Disease  Program  Pulmonary Fibrosis [facility] [place], [place], [address]  Pager: [phone], If no answer  OR between  19:00-7:00h: page [phone] Telephone (clinical office): [phone] Telephone (research): [phone]  3:11 PM 01/02/2020

## 2020-01-02 NOTE — Progress Notes (Signed)
ANTICOAGULATION CONSULT NOTE - Follow Up Consult  Pharmacy Consult Lovenox  Indication: atrial fibrillation/prophylaxis  Allergies  Allergen Reactions  . Ancef [Cefazolin] Hives  . Aspirin Other (See Comments)    GI Bleed  . Doxycycline Diarrhea and Nausea And Vomiting  . Sulfa Antibiotics Hives    Possible loss of consciousness  . Ciprofloxacin Rash    "feels like bugs are crawling in my head"  . Lesinurad-Allopurinol Nausea And Vomiting  . Penicillins Rash    Patient Measurements: Height: 5\' 2"  (157.5 cm) Weight: 108.6 kg (239 lb 6.7 oz) IBW/kg (Calculated) : 50.1  Vital Signs: Temp: 98.3 F (36.8 C) (09/26 0700) Temp Source: Axillary (09/26 0700) BP: 150/96 (09/26 1024) Pulse Rate: 95 (09/26 1024)  Labs: Recent Labs    12/31/19 0418 12/31/19 0418 01/01/20 0433 01/01/20 1231  HGB 14.3   < > 14.2 14.6  HCT 44.0  --  44.1 43.0  PLT 209  --  186  --   CREATININE 1.06*  --  1.07*  --    < > = values in this interval not displayed.    Estimated Creatinine Clearance: 68.1 mL/min (A) (by C-G formula based on SCr of 1.07 mg/dL (H)).  Assessment: 54 year old female with COVID previously on apixaban for Afib transitioned back to Lovenox on 9/21.   Lovenox was held since 9/24 due to epitaxis and some vaginal bleeding. Ok to do prophylaxis dose for now. We will use 0.5mg /kg/day due to wt.   Goal of Therapy:  Anti-Xa 0.3-0.6 Monitor platelets by anticoagulation protocol: Yes   Plan:  Lovenox 55mg  SQ qday F/u with bleeding  10/24, PharmD, BCIDP, AAHIVP, CPP Infectious Disease Pharmacist 01/02/2020 10:46 AM

## 2020-01-02 NOTE — Progress Notes (Signed)
xr called wit hbelow  Has pneumomediastinum  Plan  - continue to monitor    SIGNATURE    Dr. Kalman Shan, M.D., F.C.C.P,  Pulmonary and Critical Care Medicine Staff Physician, [facility] Director - Interstitial Lung Disease  Program  Pulmonary Fibrosis [facility] at [facility] [place], [place], [address]  Pager: [phone], If no answer  OR between  19:00-7:00h: page [phone] Telephone (clinical office): [phone] Telephone (research): [phone]  1:28 PM 01/02/2020

## 2020-01-02 NOTE — Progress Notes (Signed)
Ok to resume Xanax, Lexapro, and Buspar per Dr. Marchelle Gearing.  Ulyses Southward, PharmD, BCIDP, AAHIVP, CPP Infectious Disease Pharmacist 01/02/2020 3:37 PM

## 2020-01-03 ENCOUNTER — Inpatient Hospital Stay (HOSPITAL_COMMUNITY): Payer: 59

## 2020-01-03 LAB — POCT I-STAT 7, (LYTES, BLD GAS, ICA,H+H)
Acid-Base Excess: 3 mmol/L — ABNORMAL HIGH (ref 0.0–2.0)
Bicarbonate: 31.5 mmol/L — ABNORMAL HIGH (ref 20.0–28.0)
Calcium, Ion: 1.29 mmol/L (ref 1.15–1.40)
HCT: 35 % — ABNORMAL LOW (ref 36.0–46.0)
Hemoglobin: 11.9 g/dL — ABNORMAL LOW (ref 12.0–15.0)
O2 Saturation: 81 %
Patient temperature: 98.6
Potassium: 4.5 mmol/L (ref 3.5–5.1)
Sodium: 149 mmol/L — ABNORMAL HIGH (ref 135–145)
TCO2: 34 mmol/L — ABNORMAL HIGH (ref 22–32)
pCO2 arterial: 67.7 mmHg (ref 32.0–48.0)
pH, Arterial: 7.275 — ABNORMAL LOW (ref 7.350–7.450)
pO2, Arterial: 53 mmHg — ABNORMAL LOW (ref 83.0–108.0)

## 2020-01-03 LAB — CBC
HCT: 38 % (ref 36.0–46.0)
Hemoglobin: 12 g/dL (ref 12.0–15.0)
MCH: 30.6 pg (ref 26.0–34.0)
MCHC: 31.6 g/dL (ref 30.0–36.0)
MCV: 96.9 fL (ref 80.0–100.0)
Platelets: 87 10*3/uL — ABNORMAL LOW (ref 150–400)
RBC: 3.92 MIL/uL (ref 3.87–5.11)
RDW: 14 % (ref 11.5–15.5)
WBC: 16.2 10*3/uL — ABNORMAL HIGH (ref 4.0–10.5)
nRBC: 0.2 % (ref 0.0–0.2)

## 2020-01-03 LAB — COMPREHENSIVE METABOLIC PANEL
ALT: 83 U/L — ABNORMAL HIGH (ref 0–44)
AST: 27 U/L (ref 15–41)
Albumin: 2.2 g/dL — ABNORMAL LOW (ref 3.5–5.0)
Alkaline Phosphatase: 89 U/L (ref 38–126)
Anion gap: 8 (ref 5–15)
BUN: 84 mg/dL — ABNORMAL HIGH (ref 6–20)
CO2: 28 mmol/L (ref 22–32)
Calcium: 8.2 mg/dL — ABNORMAL LOW (ref 8.9–10.3)
Chloride: 108 mmol/L (ref 98–111)
Creatinine, Ser: 1.16 mg/dL — ABNORMAL HIGH (ref 0.44–1.00)
GFR calc Af Amer: >60 mL/min (ref >60)
GFR calc non Af Amer: 53 mL/min — ABNORMAL LOW (ref >60)
Glucose, Bld: 435 mg/dL — ABNORMAL HIGH (ref 70–99)
Potassium: 4.3 mmol/L (ref 3.5–5.1)
Sodium: 144 mmol/L (ref 135–145)
Total Bilirubin: 0.6 mg/dL (ref 0.3–1.2)
Total Protein: 5.2 g/dL — ABNORMAL LOW (ref 6.5–8.1)

## 2020-01-03 LAB — PHOSPHORUS: Phosphorus: 3.7 mg/dL (ref 2.5–4.6)

## 2020-01-03 LAB — GLUCOSE, CAPILLARY
Glucose-Capillary: 265 mg/dL — ABNORMAL HIGH (ref 70–99)
Glucose-Capillary: 296 mg/dL — ABNORMAL HIGH (ref 70–99)
Glucose-Capillary: 310 mg/dL — ABNORMAL HIGH (ref 70–99)
Glucose-Capillary: 315 mg/dL — ABNORMAL HIGH (ref 70–99)
Glucose-Capillary: 329 mg/dL — ABNORMAL HIGH (ref 70–99)
Glucose-Capillary: 374 mg/dL — ABNORMAL HIGH (ref 70–99)
Glucose-Capillary: 408 mg/dL — ABNORMAL HIGH (ref 70–99)
Glucose-Capillary: 409 mg/dL — ABNORMAL HIGH (ref 70–99)
Glucose-Capillary: 411 mg/dL — ABNORMAL HIGH (ref 70–99)

## 2020-01-03 LAB — MAGNESIUM: Magnesium: 3.1 mg/dL — ABNORMAL HIGH (ref 1.7–2.4)

## 2020-01-03 LAB — VITAMIN D 25 HYDROXY (VIT D DEFICIENCY, FRACTURES): Vit D, 25-Hydroxy: 40.79 ng/mL (ref 30–100)

## 2020-01-03 MED ORDER — DEXTROSE 50 % IV SOLN: 0.0000 mL | INTRAVENOUS | Status: DC | PRN

## 2020-01-03 MED ORDER — INSULIN DETEMIR 100 UNIT/ML ~~LOC~~ SOLN
8.0000 [IU] | Freq: Once | SUBCUTANEOUS | Status: AC
  Administered 2020-01-03: 8 [IU] via SUBCUTANEOUS
  Filled 2020-01-03: qty 0.08

## 2020-01-03 MED ORDER — INSULIN REGULAR(HUMAN) IN NACL 100-0.9 UT/100ML-% IV SOLN
INTRAVENOUS | Status: DC
  Administered 2020-01-03: 19 [IU]/h via INTRAVENOUS
  Administered 2020-01-04: 3.6 [IU]/h via INTRAVENOUS
  Filled 2020-01-03 (×3): qty 100

## 2020-01-03 MED ORDER — INSULIN ASPART 100 UNIT/ML ~~LOC~~ SOLN
5.0000 [IU] | SUBCUTANEOUS | Status: DC
  Administered 2020-01-03 (×3): 5 [IU] via SUBCUTANEOUS

## 2020-01-03 MED ORDER — METHYLPREDNISOLONE SODIUM SUCC 40 MG IJ SOLR
30.0000 mg | Freq: Every day | INTRAMUSCULAR | Status: AC
  Administered 2020-01-07 – 2020-01-09 (×3): 30 mg via INTRAVENOUS
  Filled 2020-01-03 (×3): qty 0.75

## 2020-01-03 MED ORDER — IOHEXOL 350 MG/ML SOLN
100.0000 mL | Freq: Once | INTRAVENOUS | Status: AC | PRN
  Administered 2020-01-03: 100 mL via INTRAVENOUS

## 2020-01-03 MED ORDER — METHYLPREDNISOLONE SODIUM SUCC 125 MG IJ SOLR
60.0000 mg | Freq: Every day | INTRAMUSCULAR | Status: AC
  Administered 2020-01-04 – 2020-01-06 (×3): 60 mg via INTRAVENOUS
  Filled 2020-01-03 (×3): qty 2

## 2020-01-03 MED ORDER — INSULIN DETEMIR 100 UNIT/ML ~~LOC~~ SOLN
38.0000 [IU] | Freq: Two times a day (BID) | SUBCUTANEOUS | Status: DC
  Administered 2020-01-03 (×2): 38 [IU] via SUBCUTANEOUS
  Filled 2020-01-03 (×3): qty 0.38

## 2020-01-03 MED ORDER — VITAL AF 1.2 CAL PO LIQD
1000.0000 mL | ORAL | Status: DC
  Administered 2020-01-03 – 2020-01-06 (×5): 1000 mL
  Filled 2020-01-03 (×7): qty 1000

## 2020-01-03 NOTE — Progress Notes (Signed)
eLink Physician-Brief Progress Note Patient Name: JAMARI DIANA DOB: 18-Oct-1965 MRN: [id number]   Date of Service  01/03/2020  HPI/Events of Note  ABG on 100%/PRVC 20/TV 400/P 14 = 7.275/67.7/53. History of pneumothorax in setting of COVID pneumonia. Reluctant to increase PEEP without checking CXR for recurrent pneumothorax.   eICU Interventions  Plan: 1. Portable CXR STAT.      Intervention Category Major Interventions: Hypoxemia - evaluation and management;Respiratory failure - evaluation and management  [name] 01/03/2020, 9:54 PM

## 2020-01-03 NOTE — Progress Notes (Signed)
RT transported patent to CT without  Issues. Let on 100% after transport due to sat of 89%

## 2020-01-03 NOTE — Progress Notes (Signed)
eLink Physician-Brief Progress Note Patient Name: Linda Richmond DOB: 25-Nov-1965 MRN: [id number]   Date of Service  01/03/2020  HPI/Events of Note  Notified of CXR result with small right sided pneumothorax. No hemodynamic changes On VAC 20/400/90%/10 PEEP  eICU Interventions  Repeat CXR in 3-4 hours Inform eLink if with hemodynamic changes     Intervention Category Intermediate Interventions: Diagnostic test evaluation  Darl Pikes 01/03/2020, 5:49 AM

## 2020-01-03 NOTE — Progress Notes (Signed)
eLink Physician-Brief Progress Note Patient Name: JOHNDA BILLIOT DOB: [birth year] MRN: [id number]   Date of Service  01/03/2020  HPI/Events of Note  Hyperglycemia - Blood glucose = 315 --> 265 --> 408. Currently on Levemir Q 12 hours and Q 4 hour Novolog SSI.  eICU Interventions  Plan: 1. D/C Levemir and Novolog SSI. 2. Insulin IV infusion per EndoTool protocol.      Intervention Category Major Interventions: Hyperglycemia - active titration of insulin therapy  [name] 01/03/2020, 9:31 PM

## 2020-01-03 NOTE — Progress Notes (Signed)
eLink Physician-Brief Progress Note Patient Name: Linda Richmond DOB: [birth year] MRN: [id number]   Date of Service  01/03/2020  HPI/Events of Note  Notified of glucose 300s on Levemir 30 BID and resistant scale SSI  eICU Interventions  Will add additional Levemir 8 units and increase Levemir to 38 BID If persistently elevated will have to transition to insulin drip     Intervention Category Major Interventions: Hyperglycemia - active titration of insulin therapy  Darl Pikes 01/03/2020, 5:17 AM

## 2020-01-03 NOTE — Progress Notes (Signed)
Nutrition Follow-up  DOCUMENTATION CODES:   Morbid obesity  INTERVENTION:   Initiate tube feeding via OG tube: Vital AF 1.2 at 70 ml/h (1680 ml per day)  Provides 2016 kcal, 126 gm protein, 1362 ml free water daily  NUTRITION DIAGNOSIS:   Increased nutrient needs related to acute illness (COVID-19) as evidenced by estimated needs.  Ongoing  GOAL:   Patient will meet greater than or equal to 90% of their needs  Progressing  MONITOR:   PO intake, Supplement acceptance, Labs, Skin  REASON FOR ASSESSMENT:   Consult Enteral/tube feeding initiation and management  ASSESSMENT:   Linda Richmond  is a 54 y.o. female, with past medical history of anxiety, depression, diabetes, GERD, hypertension, vitamin D deficiency, negative rheumatoid arthritis, she presents to ED secondary to shortness of breath, reported began yesterday, patient reports for last week she started to have Covid symptoms including generalized body ache, headache, runny nose, nasal congestion, she was tested positive for COVID-19 on 8/27, prescribed Z-Pak and prednisone, she has finished both without much improvement, reports she started to have dyspnea yesterday, her son is admitted to [facility] due to COVID-19 as well, patient is unvaccinated, patient denies any chest pain, hemoptysis.  9/21 - NPO 9/22 - transferred to ICU, BiPAP 9/24 - Cortrak placement unsuccessful 9/25 - intubated, OG tube placed.  Cortrak tube was unable to be placed Friday. OG was placed after intubation on 9/25.   Patient is currently intubated on ventilator support MV: 9.5 L/min Temp (24hrs), Avg:97.9 F (36.6 C), Min:97.7 F (36.5 C), Max:98.2 F (36.8 C)   Medications reviewed and include Colace, novoLOG, Levemir, tradjenta, Solu-medrol, vitamin D weekly. IVF: NS @ 10 ml/hr  Labs reviewed. BUN 84, creat 1.16, mag 3.1, vitamin D 40.79 WNL CBG: [phone]  Admit weight (9/03): 118.8 kg Current weight: 108.7 kg UOP:  470 ml x 24 hours I/O's: +1,248 ml since admission  Diet Order:   Diet Order    None      EDUCATION NEEDS:   No education needs have been identified at this time  Skin:  Skin Assessment: Reviewed RN Assessment  Last BM:  9/24  Height:   Ht Readings from Last 1 Encounters:  12/14/19 5\' 2"  (1.575 m)    Weight:   Wt Readings from Last 1 Encounters:  01/03/20 108.7 kg    Ideal Body Weight:  50 kg  BMI:  Body mass index is 43.83 kg/m.  Estimated Nutritional Needs:   Kcal:  1870-2330  Protein:  110-125 grams  Fluid:  > 1.8 L    01/05/20, RD, LDN, CNSC Please refer to Amion for contact information.

## 2020-01-04 LAB — GLUCOSE, CAPILLARY
Glucose-Capillary: 129 mg/dL — ABNORMAL HIGH (ref 70–99)
Glucose-Capillary: 135 mg/dL — ABNORMAL HIGH (ref 70–99)
Glucose-Capillary: 136 mg/dL — ABNORMAL HIGH (ref 70–99)
Glucose-Capillary: 141 mg/dL — ABNORMAL HIGH (ref 70–99)
Glucose-Capillary: 142 mg/dL — ABNORMAL HIGH (ref 70–99)
Glucose-Capillary: 144 mg/dL — ABNORMAL HIGH (ref 70–99)
Glucose-Capillary: 148 mg/dL — ABNORMAL HIGH (ref 70–99)
Glucose-Capillary: 151 mg/dL — ABNORMAL HIGH (ref 70–99)
Glucose-Capillary: 176 mg/dL — ABNORMAL HIGH (ref 70–99)
Glucose-Capillary: 184 mg/dL — ABNORMAL HIGH (ref 70–99)
Glucose-Capillary: 192 mg/dL — ABNORMAL HIGH (ref 70–99)
Glucose-Capillary: 204 mg/dL — ABNORMAL HIGH (ref 70–99)
Glucose-Capillary: 214 mg/dL — ABNORMAL HIGH (ref 70–99)
Glucose-Capillary: 378 mg/dL — ABNORMAL HIGH (ref 70–99)
Glucose-Capillary: 413 mg/dL — ABNORMAL HIGH (ref 70–99)

## 2020-01-04 LAB — BASIC METABOLIC PANEL
Anion gap: 9 (ref 5–15)
BUN: 75 mg/dL — ABNORMAL HIGH (ref 6–20)
CO2: 30 mmol/L (ref 22–32)
Calcium: 8.8 mg/dL — ABNORMAL LOW (ref 8.9–10.3)
Chloride: 113 mmol/L — ABNORMAL HIGH (ref 98–111)
Creatinine, Ser: 0.88 mg/dL (ref 0.44–1.00)
GFR calc Af Amer: >60 mL/min (ref >60)
GFR calc non Af Amer: >60 mL/min (ref >60)
Glucose, Bld: 142 mg/dL — ABNORMAL HIGH (ref 70–99)
Potassium: 4.2 mmol/L (ref 3.5–5.1)
Sodium: 152 mmol/L — ABNORMAL HIGH (ref 135–145)

## 2020-01-04 LAB — CBC
HCT: 37.6 % (ref 36.0–46.0)
Hemoglobin: 11.3 g/dL — ABNORMAL LOW (ref 12.0–15.0)
MCH: 30.1 pg (ref 26.0–34.0)
MCHC: 30.1 g/dL (ref 30.0–36.0)
MCV: 100 fL (ref 80.0–100.0)
Platelets: 94 10*3/uL — ABNORMAL LOW (ref 150–400)
RBC: 3.76 MIL/uL — ABNORMAL LOW (ref 3.87–5.11)
RDW: 14.6 % (ref 11.5–15.5)
WBC: 8 10*3/uL (ref 4.0–10.5)
nRBC: 1.1 % — ABNORMAL HIGH (ref 0.0–0.2)

## 2020-01-04 LAB — POCT I-STAT 7, (LYTES, BLD GAS, ICA,H+H)
Acid-Base Excess: 2 mmol/L (ref 0.0–2.0)
Bicarbonate: 32.6 mmol/L — ABNORMAL HIGH (ref 20.0–28.0)
Calcium, Ion: 1.31 mmol/L (ref 1.15–1.40)
HCT: 56 % — ABNORMAL HIGH (ref 36.0–46.0)
Hemoglobin: 19 g/dL — ABNORMAL HIGH (ref 12.0–15.0)
O2 Saturation: 85 %
Patient temperature: 99.3
Potassium: 4.3 mmol/L (ref 3.5–5.1)
Sodium: 154 mmol/L — ABNORMAL HIGH (ref 135–145)
TCO2: 35 mmol/L — ABNORMAL HIGH (ref 22–32)
pCO2 arterial: 74.3 mmHg (ref 32.0–48.0)
pH, Arterial: 7.253 — ABNORMAL LOW (ref 7.350–7.450)
pO2, Arterial: 62 mmHg — ABNORMAL LOW (ref 83.0–108.0)

## 2020-01-04 MED ORDER — METOPROLOL TARTRATE 25 MG/10 ML ORAL SUSPENSION
25.0000 mg | Freq: Two times a day (BID) | ORAL | Status: DC
  Administered 2020-01-04: 25 mg
  Filled 2020-01-04: qty 10

## 2020-01-04 MED ORDER — INSULIN REGULAR(HUMAN) IN NACL 100-0.9 UT/100ML-% IV SOLN
INTRAVENOUS | Status: AC
  Administered 2020-01-05: 6 [IU]/h via INTRAVENOUS
  Administered 2020-01-05: 7.5 [IU]/h via INTRAVENOUS
  Administered 2020-01-06: 6.5 [IU]/h via INTRAVENOUS
  Administered 2020-01-06: 6 [IU]/h via INTRAVENOUS
  Administered 2020-01-07: 5 [IU]/h via INTRAVENOUS
  Administered 2020-01-07: 10.5 [IU]/h via INTRAVENOUS
  Administered 2020-01-08: 3.8 [IU]/h via INTRAVENOUS
  Administered 2020-01-08: 7.5 [IU]/h via INTRAVENOUS
  Administered 2020-01-09: 1 [IU]/h via INTRAVENOUS
  Administered 2020-01-09: 7.5 [IU]/h via INTRAVENOUS
  Administered 2020-01-10: 11.5 [IU]/h via INTRAVENOUS
  Administered 2020-01-10: 9.5 [IU]/h via INTRAVENOUS
  Filled 2020-01-04 (×18): qty 100

## 2020-01-04 MED ORDER — DEXTROSE 50 % IV SOLN: 0.0000 mL | INTRAVENOUS | Status: DC | PRN

## 2020-01-04 MED ORDER — INSULIN ASPART 100 UNIT/ML ~~LOC~~ SOLN
5.0000 [IU] | SUBCUTANEOUS | Status: DC
  Administered 2020-01-04 (×2): 5 [IU] via SUBCUTANEOUS

## 2020-01-04 MED ORDER — IPRATROPIUM-ALBUTEROL 0.5-2.5 (3) MG/3ML IN SOLN: 3.0000 mL | Freq: Four times a day (QID) | RESPIRATORY_TRACT | Status: DC | PRN

## 2020-01-04 MED ORDER — INSULIN ASPART 100 UNIT/ML ~~LOC~~ SOLN
0.0000 [IU] | SUBCUTANEOUS | Status: DC
  Administered 2020-01-04: 15 [IU] via SUBCUTANEOUS
  Administered 2020-01-04: 4 [IU] via SUBCUTANEOUS

## 2020-01-04 MED ORDER — METOPROLOL TARTRATE 25 MG/10 ML ORAL SUSPENSION
50.0000 mg | Freq: Two times a day (BID) | ORAL | Status: DC
  Administered 2020-01-04 – 2020-01-14 (×17): 50 mg
  Filled 2020-01-04 (×23): qty 20

## 2020-01-04 MED ORDER — INSULIN DETEMIR 100 UNIT/ML ~~LOC~~ SOLN
38.0000 [IU] | Freq: Two times a day (BID) | SUBCUTANEOUS | Status: DC
  Administered 2020-01-04 (×2): 38 [IU] via SUBCUTANEOUS
  Filled 2020-01-04 (×3): qty 0.38

## 2020-01-04 MED ORDER — FREE WATER
200.0000 mL | Status: DC
  Administered 2020-01-04 – 2020-01-07 (×19): 200 mL

## 2020-01-04 NOTE — Progress Notes (Signed)
eLink Physician-Brief Progress Note Patient Name: Linda Richmond DOB: [birth year] MRN: [id number]   Date of Service  01/04/2020  HPI/Events of Note  Hyperglycemia - Blood glucose = 413.   eICU Interventions  Plan: 1. D/C current insulin therapy. 2. Insulin IV infusion per EndoTool.  This is the second consecutive evening that I have had to place this patient on an insulin IV infusion in spite of large doses of Levemir. I would not take this patient off of the insulin IV infusion until her overall condition improves.      Intervention Category Major Interventions: Hyperglycemia - active titration of insulin therapy  [name] 01/04/2020, 11:58 PM

## 2020-01-04 NOTE — Progress Notes (Signed)
eLink Physician-Brief Progress Note Patient Name: Linda Richmond DOB: 05-Aug-1965 MRN: [id number]   Date of Service  01/04/2020  HPI/Events of Note  Hypoxia - Portable CXR reveals bilateral perihilar and basilar infiltrates with mild progression since previous study. No pneumothorax. Sat now 93%  eICU Interventions  Continue present management.      Intervention Category Major Interventions: Hypoxemia - evaluation and management  Jerik Falletta Eugene 01/04/2020, 12:13 AM

## 2020-01-04 NOTE — Progress Notes (Signed)
NAME:  Linda Richmond, MRN:  [id number], DOB:  16-Dec-1965, LOS: 25 ADMISSION DATE:  12/22/19, CONSULTATION DATE:  12/28/2019 REFERRING MD:  Dr. Jerral Ralph, CHIEF COMPLAINT:  Hypoxic resp failure/ COVID  Brief History    54 year old female with history of DMT2, HTN, seronegative RA, anxiety, and depression who initially presented to [facility] on 9/3 with one week history of generalized body aches, headache, nasal congestion, and progressive shortness of breath.  She is unvaccinated.  Found to be COVID positive with saturations in the mid 80's on room air and CXR significant for multifocal opacities.  She was admitted by [facility].  On 9/6 she was transferred to [facility] given her increasing O2 requirements on NRB.  Hospitalization since complicated by Afib with RVR, and worsening respiratory failure.  Now unable to tolerate HHFNC beyond 50L and NRB with saturations in the high 70s/80's.  PCCM consulted for ICU transfer and likely BiPAP.  Past Medical History  DMT2, HTN, seronegative RA, anxiety, depression  Significant Hospital Events   9/3 Admitted to [facility] 9/6 Transferred to [facility] given worsening hypoxemia/ NRB 15L 9/8 Afib w/ RVR 9/19 cefepime added due to worsening leukocytosis/ persistent infiltrates  9/21 transitioned to [facility] and NRB/ PCCM consulted  9/24 - Tolerated day shift off of BiPAP . Tolerated BiPAP well at night. Tolerating clear liquids and feels ready to eat . Fialed cortrak with desats and epistaxis. Lovenox held 9/25  - INTUBATED  Consults:  Cardiology 9/9 PCCM  9/21  Procedures:  9/16 PICC > single-lumen only 9/26 - INTUBATION  Significant Diagnostic Tests:  9/3>>Chest x-ray: New bilateral mid to lower lung patchy opacities 9/9>> Echo: EF 60-65% 9/16>> chest x-ray: Airspace opacity throughout mid/lower lungs 9/16>> chest x-ray: Interval development of pneumomediastinum 9/17>> chest x-ray: Pneumomediastinum, tiny left apical pneumothorax-no change in bilateral airspace opacities 9/18>>  chest x-ray: Stable extensive patchy hazy opacities-no pneumothorax. 9/19>> chest x-ray: Persistent airspace opacities over mid to lower lungs bilaterally. 9/20>> chest x-ray: Persistent streaky infiltrates to bilateral lungs. 9/24 CXR> stable low lung volumes, but with some interval improvement to bilateral opacities   Micro Data:  9/3 SARS2 >> positive  9/3 BCx 2 >> staph epi 1/2 9/13 MRSA PCR >> neg  Antimicrobials:  9/19 cefepime >>  baricitinib 9/5 >> 9/18  Decadron 9/3 > 9/4 Solumedrol 9/5 > 9/15; 9/20 > remdesivir 9/3 > 9/7  Interim history/subjective:   Increased fentanyl gtt to 200 and added versed due to dyssynchronous breathing on vent. Still awake and following simple commands.  Still hypernatremic so will continue giving free water.  AKI resolving.   Objective   Blood pressure 118/76, pulse 82, temperature 98.3 F (36.8 C), temperature source Axillary, resp. rate (!) 23, height  (1.575 m), weight 112.4 kg, last menstrual period 04/18/2018, SpO2 93 %.    Vent Mode: PRVC FiO2 (%):  [90 %-100 %] 100 % Set Rate:  [20 bmp-25 bmp] 25 bmp Vt Set:  [400 mL] 400 mL PEEP:  [10 cmH20-16 cmH20] 16 cmH20 Plateau Pressure:  [20 cmH20-30 cmH20] 29 cmH20   Intake/Output Summary (Last 24 hours) at 01/04/2020 1333 Last data filed at 01/04/2020 1100 Gross per 24 hour  Intake 1715.89 ml  Output 1080 ml  Net 635.89 ml   Filed Weights   01/02/20 0400 01/03/20 0424 01/04/20 0500  Weight: 108.6 kg 108.7 kg 112.4 kg   Examination: General: critically ill-appearing obese female, lying in bed on mechanical ventilation, NAD. HENT: PERRL, ETT and OGT in place. CV:  normal rate and regular rhythm, no m/r/g Pulm: CTAB, no adventitious sounds noted. Abdomen: soft, non-distended. +BS Extremities: warm and dry Neuro: sedated   Resolved Hospital Problem list      Assessment & Plan:  OSA noncompliant with CPAP Acute hypoxic respiratory failure due to ARDS from COVID 19  pneumonia  01/04/2020  -on 100% oxygen and PEEP of 10.  On fentanyl infusion.  Appears comfortable  Plan: -PRVC at 8 cc/kg per ideal body weight -VAP bundle -Check repeat chest x-ray  -routinely trend ABGs -prn duonebs -f/u CBC   COVID-19 -Out of infectious contagion perid  Plan -Continue solumedrol taper   Paroxysmal A. Fib [echocardiogram 12/16/2019: EF 65%]  - 01/04/2020  Entered A-fib overnight. Started on enteral lopressor and will try to come down on cardizem infusion  Plan -taper cardizem drip as tolerated -continue enteral lopressor -continue prophylactic dose Lovenox -Continue telemetry monitoring   AKI - resolving  PLAN -monitor renal indices   Anxiety   Sleep disturbance Sedation needs on the ventilator  Plan -Sedation protocol with mechanical ventilation -Okay for RASS sedation score 0 and -3 and maintain adequate synchrony   Hypothyroidism  Continue daily armour    Poorly controlled diabetes  Plan: - Levemir 38 units BID  - sliding scale insulin - Tradjenta 5mg  daily - novolog for TF coverage   Vitamin D supplementation -currently 50,000 units once a week  Plan -Check vitamin D level 01/03/2020   Best practice:  Diet: NPO, TF  Pain/Anxiety/Delirium protocol (if indicated): PAD protocol   VAP protocol (if indicated): n/a  DVT prophylaxis: lovenox full dose for A. Fib   GI prophylaxis: H2 blockade   glucose control: levemir, novolog, SSI  mobility: Bedrest   Code Status: full - 9.25_ Daughter at the bedside and son over telephone. Son asked aboput prognosis - explained high risk for mortality in ICU and if survives very likely looking at LTAC/Trach and attendant chronic critical illness -> he expressed that patient was never a fan of DNR and wants "everything done" and "fighting to end". For now full code.   Family Communication:   9/26: Son Rhiley Gaylor updated. Says patient daughter will be at bedside  Disposition:  ICU   ATTESTATION & Lars Pinks, MD 01/04/2020 1:33 PM   LABS    PULMONARY Recent Labs  Lab 01/01/20 1231 01/03/20 2135 01/04/20 0938  PHART 7.291* 7.275* 7.253*  PCO2ART 68.0* 67.7* 74.3*  PO2ART 105 53* 62*  HCO3 32.9* 31.5* 32.6*  TCO2 35* 34* 35*  O2SAT 97.0 81.0 85.0    CBC Recent Labs  Lab 12/31/19 0418 12/31/19 0418 01/01/20 0433 01/01/20 1231 01/03/20 0022 01/03/20 2135 01/04/20 0938  HGB 14.3   < > 14.2   < > 12.0 11.9* 19.0*  HCT 44.0   < > 44.1   < > 38.0 35.0* 56.0*  WBC 12.7*  --  13.5*  --  16.2*  --   --   PLT 209  --  186  --  87*  --   --    < > = values in this interval not displayed.    COAGULATION No results for input(s): INR in the last 168 hours.  CARDIAC  No results for input(s): TROPONINI in the last 168 hours. No results for input(s): PROBNP in the last 168 hours.   CHEMISTRY Recent Labs  Lab 12/30/19 0507 12/30/19 0507 12/31/19 4332 12/31/19 9518 01/01/20 8416 01/01/20 6063 01/01/20 1231 01/01/20 1231 01/02/20 0343 01/03/20  7858 01/03/20 0418 01/03/20 2135 01/03/20 2135 01/04/20 0619 01/04/20 0938  NA 143   < > 142   < > 143   < > 144  --   --  144  --  149*  --  152* 154*  K 4.1   < > 4.5   < > 4.1   < > 4.3   < >  --  4.3   < > 4.5   < > 4.2 4.3  CL 104  --  102  --  102  --   --   --   --  108  --   --   --  113*  --   CO2 29  --  25  --  24  --   --   --   --  28  --   --   --  30  --   GLUCOSE 183*  --  237*  --  314*  --   --   --   --  435*  --   --   --  142*  --   BUN 48*  --  56*  --  62*  --   --   --   --  84*  --   --   --  75*  --   CREATININE 0.91  --  1.06*  --  1.07*  --   --   --   --  1.16*  --   --   --  0.88  --   CALCIUM 8.6*  --  8.7*  --  8.7*  --   --   --   --  8.2*  --   --   --  8.8*  --   MG  --   --  2.8*  --   --   --   --   --   --  3.1*  --   --   --   --   --   PHOS  --   --  4.3  --  4.1  --   --   --  6.7* 3.7  --   --   --   --   --    < > = values in this  interval not displayed.   Estimated Creatinine Clearance: 84.5 mL/min (by C-G formula based on SCr of 0.88 mg/dL).   LIVER Recent Labs  Lab 12/29/19 0415 12/30/19 0507 12/31/19 0418 01/01/20 0433 01/03/20 0418  AST 86* 34 37 43* 27  ALT 121* 104* 105* 106* 83*  ALKPHOS 100 87 89 102 89  BILITOT 0.8 0.8 0.6 1.3* 0.6  PROT 6.2* 6.0* 6.2* 6.1* 5.2*  ALBUMIN 2.3* 2.3* 2.6* 2.6* 2.2*     INFECTIOUS Recent Labs  Lab 12/29/19 0415  PROCALCITON 0.20     ENDOCRINE CBG (last 3)  Recent Labs    01/04/20 1057 01/04/20 1202 01/04/20 1304  GLUCAP 136* 148* 176*      IMAGING x48h  - image(s) personally visualized  -   highlighted in bold CT ANGIO CHEST PE W OR WO CONTRAST  Result Date: 01/03/2020 CLINICAL DATA:  54 year old female positive COVID-19.  Intubated. EXAM: CT ANGIOGRAPHY CHEST WITH CONTRAST TECHNIQUE: Multidetector CT imaging of the chest was performed using the standard protocol during bolus administration of intravenous contrast. Multiplanar CT image reconstructions and MIPs were obtained to evaluate the vascular anatomy. CONTRAST:  OMNIPAQUE IOHEXOL 350 MG/ML SOLN COMPARISON:  Portable chest radiographs 0931 hours today and earlier. Report of prior Chest CTA 07/01/2007 (no images available). FINDINGS: Cardiovascular: Adequate contrast bolus timing in the pulmonary arterial tree. Respiratory motion. No convincing pulmonary artery filling defect. Evidence of calcified coronary artery atherosclerosis on series 8, image 125. Cardiac size within normal limits. No pericardial effusion. Negative visible aorta. Mediastinum/Nodes: Pneumomediastinum and pneumopericardium, small volume overall. No gas tracking into the visible lower neck or below the diaphragm at this time. No superimposed mediastinal hematoma or lymphadenopathy. Enteric tube courses through the esophagus. Lungs/Pleura: Endotracheal tube tip terminates about 14 mm above the carina. Major airways are patent.  Diffuse bilateral pulmonary ground-glass opacity with scattered areas of confluence in the perihilar lungs and both lower lobes. Some lower lobe areas are trending toward consolidation. Pneumomediastinum associated gas along the medial upper pleura. No convincing pneumothorax. No pleural effusion. Upper Abdomen: Hepatic steatosis. Absent gallbladder. Negative visible spleen, pancreas, adrenal glands, kidneys. Enteric tube terminates in the distal gastric body. Negative visible other bowel loops. No free air or free fluid in the visible peritoneal cavity. Musculoskeletal: No acute osseous abnormality identified. Review of the MIP images confirms the above findings. IMPRESSION: 1. Negative for pulmonary embolus. 2. Positive for pneumomediastinum and pneumopericardium, small volume overall. No gas tracking into the visible lower neck or below the diaphragm. No convincing pneumothorax. 3. Diffuse bilateral pulmonary ground-glass opacity compatible with COVID-19 Pneumonia. Some lower lobe opacity trending toward consolidation. No pleural effusion. 4. Satisfactory placement of ET tube and enteric tube. 5. Hepatic steatosis. Electronically Signed   By: Odessa Fleming M.D.   On: 01/03/2020 11:50   DG CHEST PORT 1 VIEW  Result Date: 01/03/2020 CLINICAL DATA:  Shortness of breath and hypoxia EXAM: PORTABLE CHEST 1 VIEW COMPARISON:  01/03/2020 FINDINGS: Endotracheal tube, right PICC line, and enteric tube are unchanged in position. Cardiac enlargement. Bilateral perihilar and basilar infiltrates. Infiltrates appear more dense than on previous study suggesting mild progression. Changes could be due to multifocal pneumonia or edema. No pneumothorax. No visible pneumomediastinum. IMPRESSION: Bilateral perihilar and basilar infiltrates with mild progression since previous study. Electronically Signed   By: Burman Nieves M.D.   On: 01/03/2020 22:06   DG Chest Port 1 View  Result Date: 01/03/2020 CLINICAL DATA:  Pneumothorax.  EXAM: PORTABLE CHEST 1 VIEW COMPARISON:  01/02/2020. FINDINGS: Endotracheal tube and NG tube in stable position. PICC line in unchanged position with tip over right atrium. Heart size stable. Interim improvement of bilateral pulmonary infiltrates with mild to moderate residual. No pleural effusion or pneumothorax. Degenerative changes scoliosis thoracic spine. Surgical clips right upper quadrant. IMPRESSION: 1. Endotracheal tube and NG tube in stable position. PICC line in unchanged position with tip over right atrium. 2. Interim improvement of bilateral pulmonary infiltrates with mild to moderate residual. Electronically Signed   By: Maisie Fus  Register   On: 01/03/2020 09:45   DG CHEST PORT 1 VIEW  Result Date: 01/03/2020 CLINICAL DATA:  Endotracheal tube follow-up EXAM: PORTABLE CHEST 1 VIEW COMPARISON:  01/02/2020 FINDINGS: Endotracheal tube, enteric tube, and right PICC line are unchanged in position. Shallow inspiration. Cardiac enlargement. Bilateral perihilar and basilar pulmonary infiltrates, likely multifocal pneumonia or edema. Progression since previous study. No pleural effusions. Minimal right apical pneumothorax and pneumomediastinum, similar prior study. IMPRESSION: Progression of bilateral perihilar and basilar pulmonary infiltrates, likely multifocal pneumonia or edema. Minimal right apical pneumothorax and pneumomediastinum. These results will be called to the ordering clinician or representative by the Radiologist Assistant, and communication documented in the  PACS or [employer]. Electronically Signed   By: Burman Nieves M.D.   On: 01/03/2020 05:10

## 2020-01-05 LAB — POCT I-STAT 7, (LYTES, BLD GAS, ICA,H+H)
Acid-Base Excess: 5 mmol/L — ABNORMAL HIGH (ref 0.0–2.0)
Acid-Base Excess: 5 mmol/L — ABNORMAL HIGH (ref 0.0–2.0)
Acid-Base Excess: 3 mmol/L — ABNORMAL HIGH (ref 0.0–2.0)
Acid-Base Excess: 6 mmol/L — ABNORMAL HIGH (ref 0.0–2.0)
Bicarbonate: 34.2 mmol/L — ABNORMAL HIGH (ref 20.0–28.0)
Bicarbonate: 33.3 mmol/L — ABNORMAL HIGH (ref 20.0–28.0)
Bicarbonate: 33.1 mmol/L — ABNORMAL HIGH (ref 20.0–28.0)
Bicarbonate: 34.7 mmol/L — ABNORMAL HIGH (ref 20.0–28.0)
Calcium, Ion: 1.34 mmol/L (ref 1.15–1.40)
Calcium, Ion: 1.34 mmol/L (ref 1.15–1.40)
Calcium, Ion: 1.34 mmol/L (ref 1.15–1.40)
Calcium, Ion: 1.3 mmol/L (ref 1.15–1.40)
HCT: 31 % — ABNORMAL LOW (ref 36.0–46.0)
HCT: 32 % — ABNORMAL LOW (ref 36.0–46.0)
HCT: 32 % — ABNORMAL LOW (ref 36.0–46.0)
HCT: 29 % — ABNORMAL LOW (ref 36.0–46.0)
Hemoglobin: 10.5 g/dL — ABNORMAL LOW (ref 12.0–15.0)
Hemoglobin: 10.9 g/dL — ABNORMAL LOW (ref 12.0–15.0)
Hemoglobin: 10.9 g/dL — ABNORMAL LOW (ref 12.0–15.0)
Hemoglobin: 9.9 g/dL — ABNORMAL LOW (ref 12.0–15.0)
O2 Saturation: 91 %
O2 Saturation: 89 %
O2 Saturation: 94 %
O2 Saturation: 96 %
Patient temperature: 98.6
Patient temperature: 98.9
Patient temperature: 100.6
Patient temperature: 98.6
Potassium: 4.8 mmol/L (ref 3.5–5.1)
Potassium: 4.9 mmol/L (ref 3.5–5.1)
Potassium: 5.6 mmol/L — ABNORMAL HIGH (ref 3.5–5.1)
Potassium: 6.1 mmol/L — ABNORMAL HIGH (ref 3.5–5.1)
Sodium: 152 mmol/L — ABNORMAL HIGH (ref 135–145)
Sodium: 151 mmol/L — ABNORMAL HIGH (ref 135–145)
Sodium: 149 mmol/L — ABNORMAL HIGH (ref 135–145)
Sodium: 147 mmol/L — ABNORMAL HIGH (ref 135–145)
TCO2: 37 mmol/L — ABNORMAL HIGH (ref 22–32)
TCO2: 35 mmol/L — ABNORMAL HIGH (ref 22–32)
TCO2: 36 mmol/L — ABNORMAL HIGH (ref 22–32)
TCO2: 37 mmol/L — ABNORMAL HIGH (ref 22–32)
pCO2 arterial: 75 mmHg (ref 32.0–48.0)
pCO2 arterial: 73.5 mmHg (ref 32.0–48.0)
pCO2 arterial: 87.4 mmHg (ref 32.0–48.0)
pCO2 arterial: 77.9 mmHg (ref 32.0–48.0)
pH, Arterial: 7.267 — ABNORMAL LOW (ref 7.350–7.450)
pH, Arterial: 7.265 — ABNORMAL LOW (ref 7.350–7.450)
pH, Arterial: 7.192 — CL (ref 7.350–7.450)
pH, Arterial: 7.257 — ABNORMAL LOW (ref 7.350–7.450)
pO2, Arterial: 74 mmHg — ABNORMAL LOW (ref 83.0–108.0)
pO2, Arterial: 67 mmHg — ABNORMAL LOW (ref 83.0–108.0)
pO2, Arterial: 95 mmHg (ref 83.0–108.0)
pO2, Arterial: 101 mmHg (ref 83.0–108.0)

## 2020-01-05 LAB — COMPREHENSIVE METABOLIC PANEL
ALT: 154 U/L — ABNORMAL HIGH (ref 0–44)
AST: 89 U/L — ABNORMAL HIGH (ref 15–41)
Albumin: 1.9 g/dL — ABNORMAL LOW (ref 3.5–5.0)
Alkaline Phosphatase: 113 U/L (ref 38–126)
Anion gap: 8 (ref 5–15)
BUN: 71 mg/dL — ABNORMAL HIGH (ref 6–20)
CO2: 30 mmol/L (ref 22–32)
Calcium: 8.7 mg/dL — ABNORMAL LOW (ref 8.9–10.3)
Chloride: 114 mmol/L — ABNORMAL HIGH (ref 98–111)
Creatinine, Ser: 0.92 mg/dL (ref 0.44–1.00)
GFR calc Af Amer: >60 mL/min (ref >60)
GFR calc non Af Amer: >60 mL/min (ref >60)
Glucose, Bld: 292 mg/dL — ABNORMAL HIGH (ref 70–99)
Potassium: 4.7 mmol/L (ref 3.5–5.1)
Sodium: 152 mmol/L — ABNORMAL HIGH (ref 135–145)
Total Bilirubin: 0.5 mg/dL (ref 0.3–1.2)
Total Protein: 5.2 g/dL — ABNORMAL LOW (ref 6.5–8.1)

## 2020-01-05 LAB — CBC WITH DIFFERENTIAL/PLATELET
Abs Immature Granulocytes: 0.13 10*3/uL — ABNORMAL HIGH (ref 0.00–0.07)
Basophils Absolute: 0 10*3/uL (ref 0.0–0.1)
Basophils Relative: 0 %
Eosinophils Absolute: 0 10*3/uL (ref 0.0–0.5)
Eosinophils Relative: 0 %
HCT: 36.8 % (ref 36.0–46.0)
Hemoglobin: 10.9 g/dL — ABNORMAL LOW (ref 12.0–15.0)
Immature Granulocytes: 2 %
Lymphocytes Relative: 5 %
Lymphs Abs: 0.4 10*3/uL — ABNORMAL LOW (ref 0.7–4.0)
MCH: 30 pg (ref 26.0–34.0)
MCHC: 29.6 g/dL — ABNORMAL LOW (ref 30.0–36.0)
MCV: 101.4 fL — ABNORMAL HIGH (ref 80.0–100.0)
Monocytes Absolute: 0.1 10*3/uL (ref 0.1–1.0)
Monocytes Relative: 2 %
Neutro Abs: 6 10*3/uL (ref 1.7–7.7)
Neutrophils Relative %: 91 %
Platelets: 110 10*3/uL — ABNORMAL LOW (ref 150–400)
RBC: 3.63 MIL/uL — ABNORMAL LOW (ref 3.87–5.11)
RDW: 15 % (ref 11.5–15.5)
WBC: 6.6 10*3/uL (ref 4.0–10.5)
nRBC: 1.2 % — ABNORMAL HIGH (ref 0.0–0.2)

## 2020-01-05 LAB — GLUCOSE, CAPILLARY
Glucose-Capillary: 142 mg/dL — ABNORMAL HIGH (ref 70–99)
Glucose-Capillary: 158 mg/dL — ABNORMAL HIGH (ref 70–99)
Glucose-Capillary: 159 mg/dL — ABNORMAL HIGH (ref 70–99)
Glucose-Capillary: 165 mg/dL — ABNORMAL HIGH (ref 70–99)
Glucose-Capillary: 167 mg/dL — ABNORMAL HIGH (ref 70–99)
Glucose-Capillary: 173 mg/dL — ABNORMAL HIGH (ref 70–99)
Glucose-Capillary: 173 mg/dL — ABNORMAL HIGH (ref 70–99)
Glucose-Capillary: 177 mg/dL — ABNORMAL HIGH (ref 70–99)
Glucose-Capillary: 179 mg/dL — ABNORMAL HIGH (ref 70–99)
Glucose-Capillary: 211 mg/dL — ABNORMAL HIGH (ref 70–99)
Glucose-Capillary: 211 mg/dL — ABNORMAL HIGH (ref 70–99)
Glucose-Capillary: 212 mg/dL — ABNORMAL HIGH (ref 70–99)
Glucose-Capillary: 212 mg/dL — ABNORMAL HIGH (ref 70–99)
Glucose-Capillary: 226 mg/dL — ABNORMAL HIGH (ref 70–99)
Glucose-Capillary: 291 mg/dL — ABNORMAL HIGH (ref 70–99)
Glucose-Capillary: 376 mg/dL — ABNORMAL HIGH (ref 70–99)

## 2020-01-05 MED ORDER — MIDAZOLAM 50MG/50ML (1MG/ML) PREMIX INFUSION
0.0000 mg/h | INTRAVENOUS | Status: DC
  Administered 2020-01-05 – 2020-01-08 (×5): 2 mg/h via INTRAVENOUS
  Administered 2020-01-09 – 2020-01-10 (×3): 3 mg/h via INTRAVENOUS
  Administered 2020-01-11: 6 mg/h via INTRAVENOUS
  Administered 2020-01-11: 7 mg/h via INTRAVENOUS
  Administered 2020-01-12 (×3): 8 mg/h via INTRAVENOUS
  Administered 2020-01-13: 6 mg/h via INTRAVENOUS
  Administered 2020-01-13 – 2020-01-14 (×6): 9 mg/h via INTRAVENOUS
  Filled 2020-01-05 (×21): qty 50

## 2020-01-05 MED ORDER — MIDAZOLAM BOLUS VIA INFUSION
1.0000 mg | INTRAVENOUS | Status: DC | PRN
  Administered 2020-01-05: 1 mg via INTRAVENOUS
  Administered 2020-01-11 – 2020-01-14 (×4): 2 mg via INTRAVENOUS
  Filled 2020-01-05: qty 2

## 2020-01-05 NOTE — Progress Notes (Signed)
NAME:  Linda Richmond, MRN:  [id number], DOB:  March 06, 1966, LOS: 26 ADMISSION DATE:  12/25/2019, CONSULTATION DATE:  12/28/2019 REFERRING MD:  Dr. Jerral Ralph, CHIEF COMPLAINT:  Hypoxic resp failure/ COVID  Brief History    54 year old female with history of DMT2, HTN, seronegative RA, anxiety, and depression who initially presented to [facility] on 9/3 with one week history of generalized body aches, headache, nasal congestion, and progressive shortness of breath.  She is unvaccinated.  Found to be COVID positive with saturations in the mid 80's on room air and CXR significant for multifocal opacities.  She was admitted by [facility].  On 9/6 she was transferred to [facility] given her increasing O2 requirements on NRB.  Hospitalization since complicated by Afib with RVR, and worsening respiratory failure.  Now unable to tolerate HHFNC beyond 50L and NRB with saturations in the high 70s/80's.  PCCM consulted for ICU transfer and likely BiPAP.  Past Medical History  DMT2, HTN, seronegative RA, anxiety, depression  Significant Hospital Events   9/3 Admitted to [facility] 9/6 Transferred to [facility] given worsening hypoxemia/ NRB 15L 9/8 Afib w/ RVR 9/19 cefepime added due to worsening leukocytosis/ persistent infiltrates  9/21 transitioned to [facility] and NRB/ PCCM consulted  9/24 - Tolerated day shift off of BiPAP . Tolerated BiPAP well at night. Tolerating clear liquids and feels ready to eat . Fialed cortrak with desats and epistaxis. Lovenox held 9/25  - INTUBATED  Consults:  Cardiology 9/9 PCCM  9/21  Procedures:  9/16 PICC > single-lumen only 9/26 - INTUBATION  Significant Diagnostic Tests:  9/3>>Chest x-ray: New bilateral mid to lower lung patchy opacities 9/9>> Echo: EF 60-65% 9/16>> chest x-ray: Airspace opacity throughout mid/lower lungs 9/16>> chest x-ray: Interval development of pneumomediastinum 9/17>> chest x-ray: Pneumomediastinum, tiny left apical pneumothorax-no change in bilateral airspace opacities 9/18>>  chest x-ray: Stable extensive patchy hazy opacities-no pneumothorax. 9/19>> chest x-ray: Persistent airspace opacities over mid to lower lungs bilaterally. 9/20>> chest x-ray: Persistent streaky infiltrates to bilateral lungs. 9/24 CXR> stable low lung volumes, but with some interval improvement to bilateral opacities   Micro Data:  9/3 SARS2 >> positive  9/3 BCx 2 >> staph epi 1/2 9/13 MRSA PCR >> neg  Antimicrobials:  9/19 cefepime >>  baricitinib 9/5 >> 9/18  Decadron 9/3 > 9/4 Solumedrol 9/5 > 9/15; 9/20 > remdesivir 9/3 > 9/7  Interim history/subjective:   Switched to insulin infusion overnight due to hyperglycemia (BG 413). Patient's tube feeds switched from high protein to Vital, which has higher amount of glucose, yesterday by dietitian. Rate of TFs also increased.  Patient dyssynchronous with ventilator. Also not taking effective breaths as she is still hypercapnic (pCO2 75). Increased fentanyl to 400 and added versed infusion for better vent sedation. Will wean as tolerated.    Objective   Blood pressure 112/70, pulse 85, temperature 98.9 F (37.2 C), temperature source Axillary, resp. rate (!) 31, height 5\' 2"  (1.575 m), weight 111.9 kg, last menstrual period 04/18/2018, SpO2 93 %.    Vent Mode: PRVC FiO2 (%):  [100 %] 100 % Set Rate:  [25 bmp-30 bmp] 30 bmp Vt Set:  [400 mL] 400 mL PEEP:  [16 cmH20] 16 cmH20 Plateau Pressure:  [26 cmH20-35 cmH20] 26 cmH20   Intake/Output Summary (Last 24 hours) at 01/05/2020 1409 Last data filed at 01/05/2020 1300 Gross per 24 hour  Intake 2720.61 ml  Output 1150 ml  Net 1570.61 ml   Filed Weights   01/03/20 0424 01/04/20 0500  01/05/20 0200  Weight: 108.7 kg 112.4 kg 111.9 kg   Examination: General: critically ill obese female, lying in bed on mechanical ventilation, NAD. HENT: PERRL, ETT and OGT in place. CV: normal rate and regular rhythm, no m/r/g Pulm: mechanically ventilated breath sounds. Patient dyssynchronous on  vent with associated desaturations. Abdomen: soft, non-distended, +BS. Extremities: warm and dry Neuro: sedated, opens eyes to voice, not following commands.   Resolved Hospital Problem list      Assessment & Plan:  OSA noncompliant with CPAP Acute hypoxic respiratory failure due to ARDS from COVID 19 pneumonia  01/05/2020  -on 100% oxygen and PEEP of 16.  On fentanyl infusion and versed infusion.  Appears comfortable  Plan: -PRVC at 8 cc/kg per ideal body weight -VAP bundle -routinely trend ABGs -9/29: pH 7.26 / pCO2 75 / pO2 74 / HCO3 34.2 -prn duonebs -f/u CBC -wean sedation as tolerated   COVID-19 -Out of infectious contagion perid  Plan -Continue solumedrol taper   Paroxysmal A. Fib [echocardiogram 12/16/2019: EF 65%]  Plan -taper cardizem drip as tolerated -continue enteral lopressor -continue prophylactic dose Lovenox -Continue telemetry monitoring   AKI - resolving  PLAN -monitor renal indices   Anxiety   Sleep disturbance Sedation needs on the ventilator  Plan -Sedation protocol with mechanical ventilation -Okay for RASS sedation score 0 and -3 and maintain adequate synchrony   Hypothyroidism  Continue daily armour    Poorly controlled diabetes  Plan: - currently on insulin infusion - will try to switch to basal bolus and SSI   Vitamin D supplementation -currently 50,000 units once a week  Plan -Check vitamin D level 01/03/2020   Best practice:  Diet: NPO, TF  Pain/Anxiety/Delirium protocol (if indicated): PAD protocol   VAP protocol (if indicated): n/a  DVT prophylaxis: lovenox full dose for A. Fib   GI prophylaxis: H2 blockade   glucose control: levemir, novolog, SSI  mobility: Bedrest   Code Status: full - 9.25_ Daughter at the bedside and son over telephone. Son asked aboput prognosis - explained high risk for mortality in ICU and if survives very likely looking at LTAC/Trach and attendant chronic critical illness -> he  expressed that patient was never a fan of DNR and wants "everything done" and "fighting to end". For now full code.   Family Communication:   9/26: Son Jimena Wieczorek updated. Says patient daughter will be at bedside  Disposition: ICU   ATTESTATION & Lars Pinks, MD 01/05/2020 2:09 PM   LABS    PULMONARY Recent Labs  Lab 01/01/20 1231 01/03/20 2135 01/04/20 0938 01/05/20 0509 01/05/20 1045  PHART 7.291* 7.275* 7.253* 7.267* 7.265*  PCO2ART 68.0* 67.7* 74.3* 75.0* 73.5*  PO2ART 105 53* 62* 74* 67*  HCO3 32.9* 31.5* 32.6* 34.2* 33.3*  TCO2 35* 34* 35* 37* 35*  O2SAT 97.0 81.0 85.0 91.0 89.0    CBC Recent Labs  Lab 01/03/20 0022 01/03/20 2135 01/04/20 1320 01/04/20 1320 01/05/20 0215 01/05/20 0509 01/05/20 1045  HGB 12.0   < > 11.3*   < > 10.9* 10.5* 10.9*  HCT 38.0   < > 37.6   < > 36.8 31.0* 32.0*  WBC 16.2*  --  8.0  --  6.6  --   --   PLT 87*  --  94*  --  110*  --   --    < > = values in this interval not displayed.    COAGULATION No results for input(s): INR in the  last 168 hours.  CARDIAC  No results for input(s): TROPONINI in the last 168 hours. No results for input(s): PROBNP in the last 168 hours.   CHEMISTRY Recent Labs  Lab 12/31/19 0418 12/31/19 0418 01/01/20 0433 01/01/20 1231 01/02/20 0343 01/03/20 0418 01/03/20 2135 01/04/20 0619 01/04/20 0619 01/04/20 2951 01/04/20 0938 01/05/20 0215 01/05/20 0215 01/05/20 0509 01/05/20 1045  NA 142   < > 143   < >  --  144   < > 152*  --  154*  --  152*  --  152* 151*  K 4.5   < > 4.1   < >  --  4.3   < > 4.2   < > 4.3   < > 4.7   < > 4.8 4.9  CL 102  --  102  --   --  108  --  113*  --   --   --  114*  --   --   --   CO2 25  --  24  --   --  28  --  30  --   --   --  30  --   --   --   GLUCOSE 237*  --  314*  --   --  435*  --  142*  --   --   --  292*  --   --   --   BUN 56*  --  62*  --   --  84*  --  75*  --   --   --  71*  --   --   --   CREATININE 1.06*  --  1.07*   --   --  1.16*  --  0.88  --   --   --  0.92  --   --   --   CALCIUM 8.7*  --  8.7*  --   --  8.2*  --  8.8*  --   --   --  8.7*  --   --   --   MG 2.8*  --   --   --   --  3.1*  --   --   --   --   --   --   --   --   --   PHOS 4.3  --  4.1  --  6.7* 3.7  --   --   --   --   --   --   --   --   --    < > = values in this interval not displayed.   Estimated Creatinine Clearance: 80.6 mL/min (by C-G formula based on SCr of 0.92 mg/dL).   LIVER Recent Labs  Lab 12/30/19 0507 12/31/19 0418 01/01/20 0433 01/03/20 0418 01/05/20 0215  AST 34 37 43* 27 89*  ALT 104* 105* 106* 83* 154*  ALKPHOS 87 89 102 89 113  BILITOT 0.8 0.6 1.3* 0.6 0.5  PROT 6.0* 6.2* 6.1* 5.2* 5.2*  ALBUMIN 2.3* 2.6* 2.6* 2.2* 1.9*     INFECTIOUS No results for input(s): LATICACIDVEN, PROCALCITON in the last 168 hours.   ENDOCRINE CBG (last 3)  Recent Labs    01/05/20 0811 01/05/20 1040 01/05/20 1249  GLUCAP 159* 142* 211*     IMAGING x48h  - image(s) personally visualized  -   highlighted in bold DG CHEST PORT 1 VIEW  Result Date: 01/03/2020 CLINICAL DATA:  Shortness of breath and hypoxia EXAM:  PORTABLE CHEST 1 VIEW COMPARISON:  01/03/2020 FINDINGS: Endotracheal tube, right PICC line, and enteric tube are unchanged in position. Cardiac enlargement. Bilateral perihilar and basilar infiltrates. Infiltrates appear more dense than on previous study suggesting mild progression. Changes could be due to multifocal pneumonia or edema. No pneumothorax. No visible pneumomediastinum. IMPRESSION: Bilateral perihilar and basilar infiltrates with mild progression since previous study. Electronically Signed   By: Burman Nieves M.D.   On: 01/03/2020 22:06

## 2020-01-06 ENCOUNTER — Inpatient Hospital Stay (HOSPITAL_COMMUNITY): Payer: 59

## 2020-01-06 LAB — CBC WITH DIFFERENTIAL/PLATELET
Abs Immature Granulocytes: 0.33 10*3/uL — ABNORMAL HIGH (ref 0.00–0.07)
Basophils Absolute: 0 10*3/uL (ref 0.0–0.1)
Basophils Relative: 0 %
Eosinophils Absolute: 0 10*3/uL (ref 0.0–0.5)
Eosinophils Relative: 0 %
HCT: 32.3 % — ABNORMAL LOW (ref 36.0–46.0)
Hemoglobin: 9.3 g/dL — ABNORMAL LOW (ref 12.0–15.0)
Immature Granulocytes: 6 %
Lymphocytes Relative: 10 %
Lymphs Abs: 0.5 10*3/uL — ABNORMAL LOW (ref 0.7–4.0)
MCH: 29.6 pg (ref 26.0–34.0)
MCHC: 28.8 g/dL — ABNORMAL LOW (ref 30.0–36.0)
MCV: 102.9 fL — ABNORMAL HIGH (ref 80.0–100.0)
Monocytes Absolute: 0.3 10*3/uL (ref 0.1–1.0)
Monocytes Relative: 5 %
Neutro Abs: 4.5 10*3/uL (ref 1.7–7.7)
Neutrophils Relative %: 79 %
Platelets: 85 10*3/uL — ABNORMAL LOW (ref 150–400)
RBC: 3.14 MIL/uL — ABNORMAL LOW (ref 3.87–5.11)
RDW: 14.8 % (ref 11.5–15.5)
WBC: 5.7 10*3/uL (ref 4.0–10.5)
nRBC: 1.2 % — ABNORMAL HIGH (ref 0.0–0.2)

## 2020-01-06 LAB — POCT I-STAT 7, (LYTES, BLD GAS, ICA,H+H)
Acid-Base Excess: 8 mmol/L — ABNORMAL HIGH (ref 0.0–2.0)
Acid-Base Excess: 7 mmol/L — ABNORMAL HIGH (ref 0.0–2.0)
Acid-Base Excess: 7 mmol/L — ABNORMAL HIGH (ref 0.0–2.0)
Bicarbonate: 35.7 mmol/L — ABNORMAL HIGH (ref 20.0–28.0)
Bicarbonate: 35.6 mmol/L — ABNORMAL HIGH (ref 20.0–28.0)
Bicarbonate: 37.1 mmol/L — ABNORMAL HIGH (ref 20.0–28.0)
Calcium, Ion: 1.3 mmol/L (ref 1.15–1.40)
Calcium, Ion: 1.3 mmol/L (ref 1.15–1.40)
Calcium, Ion: 1.28 mmol/L (ref 1.15–1.40)
HCT: 27 % — ABNORMAL LOW (ref 36.0–46.0)
HCT: 29 % — ABNORMAL LOW (ref 36.0–46.0)
HCT: 31 % — ABNORMAL LOW (ref 36.0–46.0)
Hemoglobin: 9.2 g/dL — ABNORMAL LOW (ref 12.0–15.0)
Hemoglobin: 9.9 g/dL — ABNORMAL LOW (ref 12.0–15.0)
Hemoglobin: 10.5 g/dL — ABNORMAL LOW (ref 12.0–15.0)
O2 Saturation: 96 %
O2 Saturation: 90 %
O2 Saturation: 95 %
Patient temperature: 98.6
Patient temperature: 98.6
Potassium: 5.7 mmol/L — ABNORMAL HIGH (ref 3.5–5.1)
Potassium: 5.6 mmol/L — ABNORMAL HIGH (ref 3.5–5.1)
Potassium: 6 mmol/L — ABNORMAL HIGH (ref 3.5–5.1)
Sodium: 147 mmol/L — ABNORMAL HIGH (ref 135–145)
Sodium: 146 mmol/L — ABNORMAL HIGH (ref 135–145)
Sodium: 146 mmol/L — ABNORMAL HIGH (ref 135–145)
TCO2: 38 mmol/L — ABNORMAL HIGH (ref 22–32)
TCO2: 38 mmol/L — ABNORMAL HIGH (ref 22–32)
TCO2: 40 mmol/L — ABNORMAL HIGH (ref 22–32)
pCO2 arterial: 74.2 mmHg (ref 32.0–48.0)
pCO2 arterial: 76.1 mmHg (ref 32.0–48.0)
pCO2 arterial: 87.2 mmHg (ref 32.0–48.0)
pH, Arterial: 7.29 — ABNORMAL LOW (ref 7.350–7.450)
pH, Arterial: 7.278 — ABNORMAL LOW (ref 7.350–7.450)
pH, Arterial: 7.236 — ABNORMAL LOW (ref 7.350–7.450)
pO2, Arterial: 96 mmHg (ref 83.0–108.0)
pO2, Arterial: 69 mmHg — ABNORMAL LOW (ref 83.0–108.0)
pO2, Arterial: 95 mmHg (ref 83.0–108.0)

## 2020-01-06 LAB — GLUCOSE, CAPILLARY
Glucose-Capillary: 136 mg/dL — ABNORMAL HIGH (ref 70–99)
Glucose-Capillary: 137 mg/dL — ABNORMAL HIGH (ref 70–99)
Glucose-Capillary: 144 mg/dL — ABNORMAL HIGH (ref 70–99)
Glucose-Capillary: 145 mg/dL — ABNORMAL HIGH (ref 70–99)
Glucose-Capillary: 147 mg/dL — ABNORMAL HIGH (ref 70–99)
Glucose-Capillary: 150 mg/dL — ABNORMAL HIGH (ref 70–99)
Glucose-Capillary: 151 mg/dL — ABNORMAL HIGH (ref 70–99)
Glucose-Capillary: 152 mg/dL — ABNORMAL HIGH (ref 70–99)
Glucose-Capillary: 168 mg/dL — ABNORMAL HIGH (ref 70–99)
Glucose-Capillary: 170 mg/dL — ABNORMAL HIGH (ref 70–99)
Glucose-Capillary: 170 mg/dL — ABNORMAL HIGH (ref 70–99)
Glucose-Capillary: 171 mg/dL — ABNORMAL HIGH (ref 70–99)
Glucose-Capillary: 177 mg/dL — ABNORMAL HIGH (ref 70–99)
Glucose-Capillary: 181 mg/dL — ABNORMAL HIGH (ref 70–99)
Glucose-Capillary: 187 mg/dL — ABNORMAL HIGH (ref 70–99)
Glucose-Capillary: 189 mg/dL — ABNORMAL HIGH (ref 70–99)
Glucose-Capillary: 198 mg/dL — ABNORMAL HIGH (ref 70–99)
Glucose-Capillary: 212 mg/dL — ABNORMAL HIGH (ref 70–99)

## 2020-01-06 LAB — COMPREHENSIVE METABOLIC PANEL
ALT: 131 U/L — ABNORMAL HIGH (ref 0–44)
AST: 66 U/L — ABNORMAL HIGH (ref 15–41)
Albumin: 1.6 g/dL — ABNORMAL LOW (ref 3.5–5.0)
Alkaline Phosphatase: 94 U/L (ref 38–126)
Anion gap: 4 — ABNORMAL LOW (ref 5–15)
BUN: 60 mg/dL — ABNORMAL HIGH (ref 6–20)
CO2: 34 mmol/L — ABNORMAL HIGH (ref 22–32)
Calcium: 8.2 mg/dL — ABNORMAL LOW (ref 8.9–10.3)
Chloride: 109 mmol/L (ref 98–111)
Creatinine, Ser: 0.75 mg/dL (ref 0.44–1.00)
GFR calc Af Amer: >60 mL/min (ref >60)
GFR calc non Af Amer: >60 mL/min (ref >60)
Glucose, Bld: 150 mg/dL — ABNORMAL HIGH (ref 70–99)
Potassium: 5.8 mmol/L — ABNORMAL HIGH (ref 3.5–5.1)
Sodium: 147 mmol/L — ABNORMAL HIGH (ref 135–145)
Total Bilirubin: 0.4 mg/dL (ref 0.3–1.2)
Total Protein: 4.4 g/dL — ABNORMAL LOW (ref 6.5–8.1)

## 2020-01-06 MED ORDER — ARTIFICIAL TEARS OPHTHALMIC OINT
1.0000 "application " | TOPICAL_OINTMENT | Freq: Three times a day (TID) | OPHTHALMIC | Status: DC
  Administered 2020-01-06 – 2020-01-14 (×23): 1 via OPHTHALMIC
  Filled 2020-01-06: qty 3.5

## 2020-01-06 MED ORDER — CISATRACURIUM BOLUS VIA INFUSION
0.0500 mg/kg | Freq: Once | INTRAVENOUS | Status: DC
  Filled 2020-01-06: qty 6

## 2020-01-06 MED ORDER — SODIUM ZIRCONIUM CYCLOSILICATE 5 G PO PACK
10.0000 g | PACK | Freq: Once | ORAL | Status: AC
  Administered 2020-01-06: 10 g
  Filled 2020-01-06: qty 2

## 2020-01-06 MED ORDER — SENNA 8.6 MG PO TABS
1.0000 | ORAL_TABLET | Freq: Every day | ORAL | Status: DC
  Administered 2020-01-06 – 2020-01-13 (×8): 8.6 mg
  Filled 2020-01-06 (×8): qty 1

## 2020-01-06 MED ORDER — SODIUM CHLORIDE 0.9 % IV SOLN
0.0000 ug/kg/min | INTRAVENOUS | Status: DC
  Administered 2020-01-06: 3 ug/kg/min via INTRAVENOUS
  Administered 2020-01-07 – 2020-01-11 (×8): 2.5 ug/kg/min via INTRAVENOUS
  Filled 2020-01-06 (×12): qty 20

## 2020-01-06 MED ORDER — CHROMIUM PICOLINATE 200 MCG PO TABS: 200.0000 ug | ORAL_TABLET | Freq: Three times a day (TID) | ORAL | Status: DC

## 2020-01-06 NOTE — Progress Notes (Addendum)
eLink Physician-Brief Progress Note Patient Name: Linda Richmond DOB: 02/20/1966 MRN: [id number]   Date of Service  01/06/2020  HPI/Events of Note  K+ 5.8 ABG: 7.25/ 77.9/ 101  eICU Interventions  Lokelma 10 gm via NG tube x 1, respiratory rate on the ventilator already at 34. ABG pending.        Thomasene Lot Zeppelin Commisso 01/06/2020, 4:58 AM

## 2020-01-06 NOTE — Progress Notes (Signed)
Critical Care Attending Note:  Reassessed this afternoon: P/F ratio 86 still and pCO2 has not improved.   Will proceed to prone ventilation.   Lynnell Catalan, MD [facility] ICU Physician [facility]  Critical Care  Pager: [phone] Mobile: [phone] After hours: [phone].  01/06/2020, 5:54 PM

## 2020-01-06 NOTE — Progress Notes (Signed)
Inpatient Diabetes Program Recommendations  AACE/ADA: New Consensus Statement on Inpatient Glycemic Control (2015)  Target Ranges:  Prepandial:   less than 140 mg/dL      Peak postprandial:   less than 180 mg/dL (1-2 hours)      Critically ill patients:  140 - 180 mg/dL   Lab Results  Component Value Date   GLUCAP 147 (H) 01/06/2020   HGBA1C 10.8 (H) 12/14/2019    Review of Glycemic Control Results for AIYA, KEACH (MRN [id number]) as of 01/06/2020 11:45  Ref. Range 01/06/2020 03:07 01/06/2020 04:57 01/06/2020 08:12 01/06/2020 10:19  Glucose-Capillary Latest Ref Range: 70 - 99 mg/dL 081 (H) 448 (H) 185 (H) 147 (H)    Current orders for Inpatient glycemic control: IV insulin, Tradjenta 5 mg QD Solumedrol 30 mg QD (to taper tomorrow)  Inpatient Diabetes Program Recommendations:    Insulin needs on IV insulin over last 24 hours = 171 units. Appears that drip rates are beginning to remain consistent at ~6 units/hr.   If plan is to transition, along with patient improvement consider: Levemir 65 units two hours prior to discontinuation of IV insulin, then QD to follow (can adjust dose to BID if necessary) Novolog 10 units Q4H for tube feed coverage (to be stopped or held in the event tube feeds are stopped) Novolog correction Q4H.   Secure chat sent to RN to verify patient status. If no improvement in clinical status, recommend continuing with IV insulin.  Thanks, Lujean Rave, MSN, RNC-OB Diabetes Coordinator [phone] (8a-5p)

## 2020-01-06 NOTE — Progress Notes (Signed)
NAME:  Linda Richmond, MRN:  [id number], DOB:  1965-05-18, LOS: 27 ADMISSION DATE:  12-29-2019, CONSULTATION DATE:  12/28/2019 REFERRING MD:  Dr. Jerral Ralph, CHIEF COMPLAINT:  Hypoxic resp failure/ COVID  Brief History    54 year old female with history of DMT2, HTN, seronegative RA, anxiety, and depression who initially presented to [facility] on 9/3 with one week history of generalized body aches, headache, nasal congestion, and progressive shortness of breath.  She is unvaccinated.  Found to be COVID positive with saturations in the mid 80's on room air and CXR significant for multifocal opacities.  She was admitted by [facility].  On 9/6 she was transferred to [facility] given her increasing O2 requirements on NRB.  Hospitalization since complicated by Afib with RVR, and worsening respiratory failure.  Now unable to tolerate HHFNC beyond 50L and NRB with saturations in the high 70s/80's.  PCCM consulted for ICU transfer and likely BiPAP.  Past Medical History  DMT2, HTN, seronegative RA, anxiety, depression  Significant Hospital Events   9/3 Admitted to [facility] 9/6 Transferred to [facility] given worsening hypoxemia/ NRB 15L 9/8 Afib w/ RVR 9/19 cefepime added due to worsening leukocytosis/ persistent infiltrates  9/21 transitioned to [facility] and NRB/ PCCM consulted  9/24 - Tolerated day shift off of BiPAP . Tolerated BiPAP well at night. Tolerating clear liquids and feels ready to eat . Fialed cortrak with desats and epistaxis. Lovenox held 9/25  - INTUBATED  Consults:  Cardiology 9/9 PCCM  9/21  Procedures:  9/16 PICC > single-lumen only 9/26 - INTUBATION  Significant Diagnostic Tests:  9/3>>Chest x-ray: New bilateral mid to lower lung patchy opacities 9/9>> Echo: EF 60-65% 9/16>> chest x-ray: Airspace opacity throughout mid/lower lungs 9/16>> chest x-ray: Interval development of pneumomediastinum 9/17>> chest x-ray: Pneumomediastinum, tiny left apical pneumothorax-no change in bilateral airspace opacities 9/18>>  chest x-ray: Stable extensive patchy hazy opacities-no pneumothorax. 9/19>> chest x-ray: Persistent airspace opacities over mid to lower lungs bilaterally. 9/20>> chest x-ray: Persistent streaky infiltrates to bilateral lungs. 9/24 CXR> stable low lung volumes, but with some interval improvement to bilateral opacities   Micro Data:  9/3 SARS2 >> positive  9/3 BCx 2 >> staph epi 1/2 9/13 MRSA PCR >> neg  Antimicrobials:  9/19 cefepime >>  baricitinib 9/5 >> 9/18  Decadron 9/3 > 9/4 Solumedrol 9/5 > 9/15; 9/20 > remdesivir 9/3 > 9/7  Interim history/subjective:   Still on insulin infusion, CBGs within range.  K 5.8, given lokelma.   Still slightly dyssynchronous with ventilator, will paralyze with nimbex to improve this. On FiO2 90%, PEEP 15. Will try to wean down FiO2 as tolerated. May need to prone if no improvement.   Objective   Blood pressure 105/60, pulse 67, temperature 98.6 F (37 C), temperature source Axillary, resp. rate 20, height 5\' 2"  (1.575 m), weight 112.5 kg, last menstrual period 04/18/2018, SpO2 95 %.    Vent Mode: PRVC FiO2 (%):  [90 %-100 %] 90 % Set Rate:  [30 bmp-34 bmp] 34 bmp Vt Set:  [400 mL-410 mL] 410 mL PEEP:  [12 cmH20-16 cmH20] 15 cmH20 Plateau Pressure:  [19 cmH20-37 cmH20] 34 cmH20   Intake/Output Summary (Last 24 hours) at 01/06/2020 1335 Last data filed at 01/06/2020 1217 Gross per 24 hour  Intake 3080.38 ml  Output 1280 ml  Net 1800.38 ml   Filed Weights   01/04/20 0500 01/05/20 0200 01/06/20 0400  Weight: 112.4 kg 111.9 kg 112.5 kg   Examination: General: critically ill obese female, lying  in bed of mechanical ventilation, NAD. HENT: ETT and OGT in place. CV: normal rate and regular rhythm, no m/r/g Pulm: mechanically ventilated breath sounds. Abdomen: soft, non-distended, +BS. Extremities: warm and dry Neuro: sedated, unresponsive   Resolved Hospital Problem list      Assessment & Plan:  OSA noncompliant with  CPAP Acute hypoxic respiratory failure due to ARDS from COVID 19 pneumonia  01/06/2020  -on 90% oxygen and PEEP of 15.  On fentanyl infusion. Will start nimbex today to improve vent synchrony.  Plan: -PRVC at 6 cc/kg per ideal body weight -VAP bundle -routinely trend ABGs -9/29: pH 7.29 / pCO2 74.2 / pO2 96 / HCO3 35.7 -prn duonebs -f/u CBC -wean sedation as tolerated   COVID-19 -Out of infectious contagion perid  Plan -Continue solumedrol taper   Paroxysmal A. Fib [echocardiogram 12/16/2019: EF 65%]  Plan -off cardizem drip -continue enteral lopressor -continue prophylactic dose Lovenox -Continue telemetry monitoring   AKI - resolved   Anxiety   Sleep disturbance Sedation needs on the ventilator  Plan -Sedation protocol with mechanical ventilation -Okay for RASS sedation score 0 and -3 and maintain adequate synchrony   Hypothyroidism  Continue daily armour    Poorly controlled diabetes  Plan: - currently on insulin infusion - will check chromium   Vitamin D supplementation -currently 50,000 units once a week  Plan - Check vitamin D level 01/03/2020   Best practice:  Diet: NPO, TF  Pain/Anxiety/Delirium protocol (if indicated): PAD protocol   VAP protocol (if indicated): Y  DVT prophylaxis: lovenox full dose for A. Fib   GI prophylaxis: H2 blockade   glucose control: insulin infusion  mobility: Bedrest   Code Status: full - 9.25_ Daughter at the bedside and son over telephone. Son asked aboput prognosis - explained high risk for mortality in ICU and if survives very likely looking at LTAC/Trach and attendant chronic critical illness -> he expressed that patient was never a fan of DNR and wants "everything done" and "fighting to end". For now full code.   Family Communication:   9/26: Son Rickya Ketler updated. Says patient daughter will be at bedside  Disposition: ICU   ATTESTATION & Lars Pinks, MD 01/06/2020 1:35  PM   LABS    PULMONARY Recent Labs  Lab 01/05/20 0509 01/05/20 1045 01/05/20 1811 01/05/20 2122 01/06/20 0519  PHART 7.267* 7.265* 7.192* 7.257* 7.290*  PCO2ART 75.0* 73.5* 87.4* 77.9* 74.2*  PO2ART 74* 67* 95 101 96  HCO3 34.2* 33.3* 33.1* 34.7* 35.7*  TCO2 37* 35* 36* 37* 38*  O2SAT 91.0 89.0 94.0 96.0 96.0    CBC Recent Labs  Lab 01/04/20 1320 01/04/20 1320 01/05/20 0215 01/05/20 0509 01/05/20 2122 01/06/20 0320 01/06/20 0519  HGB 11.3*   < > 10.9*   < > 9.9* 9.3* 9.2*  HCT 37.6   < > 36.8   < > 29.0* 32.3* 27.0*  WBC 8.0  --  6.6  --   --  5.7  --   PLT 94*  --  110*  --   --  85*  --    < > = values in this interval not displayed.    COAGULATION No results for input(s): INR in the last 168 hours.  CARDIAC  No results for input(s): TROPONINI in the last 168 hours. No results for input(s): PROBNP in the last 168 hours.   CHEMISTRY Recent Labs  Lab 12/31/19 0418 12/31/19 0418 01/01/20 0433 01/01/20 1231 01/02/20 0343 01/03/20  9323 01/03/20 2135 01/04/20 5573 01/04/20 2202 01/05/20 0215 01/05/20 0509 01/05/20 1045 01/05/20 1045 01/05/20 1811 01/05/20 1811 01/05/20 2122 01/05/20 2122 01/06/20 0320 01/06/20 0519  NA 142   < > 143   < >  --  144   < > 152*   < > 152*   < > 151*  --  149*  --  147*  --  147* 147*  K 4.5   < > 4.1   < >  --  4.3   < > 4.2   < > 4.7   < > 4.9   < > 5.6*   < > 6.1*   < > 5.8* 5.7*  CL 102   < > 102  --   --  108  --  113*  --  114*  --   --   --   --   --   --   --  109  --   CO2 25   < > 24  --   --  28  --  30  --  30  --   --   --   --   --   --   --  34*  --   GLUCOSE 237*   < > 314*  --   --  435*  --  142*  --  292*  --   --   --   --   --   --   --  150*  --   BUN 56*   < > 62*  --   --  84*  --  75*  --  71*  --   --   --   --   --   --   --  60*  --   CREATININE 1.06*   < > 1.07*  --   --  1.16*  --  0.88  --  0.92  --   --   --   --   --   --   --  0.75  --   CALCIUM 8.7*   < > 8.7*  --   --  8.2*  --   8.8*  --  8.7*  --   --   --   --   --   --   --  8.2*  --   MG 2.8*  --   --   --   --  3.1*  --   --   --   --   --   --   --   --   --   --   --   --   --   PHOS 4.3  --  4.1  --  6.7* 3.7  --   --   --   --   --   --   --   --   --   --   --   --   --    < > = values in this interval not displayed.   Estimated Creatinine Clearance: 93.1 mL/min (by C-G formula based on SCr of 0.75 mg/dL).   LIVER Recent Labs  Lab 12/31/19 0418 01/01/20 0433 01/03/20 0418 01/05/20 0215 01/06/20 0320  AST 37 43* 27 89* 66*  ALT 105* 106* 83* 154* 131*  ALKPHOS 89 102 89 113 94  BILITOT 0.6 1.3* 0.6 0.5 0.4  PROT 6.2* 6.1* 5.2* 5.2* 4.4*  ALBUMIN 2.6* 2.6* 2.2* 1.9* 1.6*  INFECTIOUS No results for input(s): LATICACIDVEN, PROCALCITON in the last 168 hours.   ENDOCRINE CBG (last 3)  Recent Labs    01/06/20 0812 01/06/20 1019 01/06/20 1208  GLUCAP 152* 147* 144*     IMAGING x48h  - image(s) personally visualized  -   highlighted in bold No results found.

## 2020-01-06 NOTE — Progress Notes (Signed)
Dr. Denese Killings was notified of patient's ABG results. He came to assess patient. He was also notified that patient has no twitches on TOF since 1600 - will monitor patient for ventilator dysynchrony. Orders given to prone patient.

## 2020-01-06 NOTE — TOC Progression Note (Signed)
Transition of Care [facility]) - Progression Note    Patient Details  Name: Linda Richmond MRN: [id number] Date of Birth: September 12, 1965  Transition of Care [facility]) CM/SW Contact  Lockie Pares, RN Phone Number: 01/06/2020, 4:12 PM  Clinical Narrative:     Doing poorly on 100% oxygen on ventilator, Not ventilating well, pCO2 77.  When dyssynchronous.especailly .  Sedated. Patient trialing  paralytic, may require Proning  If needed.  ARDS , critically ill. .   Expected Discharge Plan: Home/Self Care Barriers to Discharge: Continued Medical Work up  Expected Discharge Plan and Services Expected Discharge Plan: Home/Self Care   Discharge Planning Services: CM Consult   Living arrangements for the past 2 months: Single Family Home                                       Social Determinants of Health (SDOH) Interventions    Readmission Risk Interventions No flowsheet data found.

## 2020-01-06 NOTE — Progress Notes (Signed)
ANTICOAGULATION CONSULT NOTE - Follow Up Consult  Pharmacy Consult Lovenox  Indication: atrial fibrillation/prophylaxis  Allergies  Allergen Reactions   Ancef [Cefazolin] Hives   Aspirin Other (See Comments)    GI Bleed   Doxycycline Diarrhea and Nausea And Vomiting   Sulfa Antibiotics Hives    Possible loss of consciousness   Ciprofloxacin Rash    "feels like bugs are crawling in my head"   Lesinurad-Allopurinol Nausea And Vomiting   Penicillins Rash    Patient Measurements: Height: 5\' 2"  (157.5 cm) Weight: 112.5 kg (248 lb 0.3 oz) IBW/kg (Calculated) : 50.1  Vital Signs: Temp: 98.6 F (37 C) (09/30 0814) Temp Source: Axillary (09/30 0814) BP: 111/59 (09/30 0900) Pulse Rate: 71 (09/30 0900)  Labs: Recent Labs    01/04/20 0619 01/04/20 0938 01/04/20 1320 01/04/20 1320 01/05/20 0215 01/05/20 0509 01/05/20 2122 01/05/20 2122 01/06/20 0320 01/06/20 0519  HGB  --    < > 11.3*   < > 10.9*   < > 9.9*   < > 9.3* 9.2*  HCT  --    < > 37.6   < > 36.8   < > 29.0*  --  32.3* 27.0*  PLT  --   --  94*  --  110*  --   --   --  85*  --   CREATININE 0.88  --   --   --  0.92  --   --   --  0.75  --    < > = values in this interval not displayed.    Estimated Creatinine Clearance: 93.1 mL/min (by C-G formula based on SCr of 0.75 mg/dL).  Assessment: 54 year old female with COVID previously on apixaban for Afib transitioned back to Lovenox on 9/21. Lovenox was held 9/24-9/25 due to epitaxis and some vaginal bleeding but was then transitioned to 0.5 mg/kg/day for prophylaxis. 9/27 CTa negative for PE. D-dimer <1.   Her CBC continues to fluctuate during this admission. Her most recent Hgb is down to 9.2, Hct 27, and platelets 85. No additional signs of bleeding noted at this time. Low concern for bleed or HIT, continue anticoagulation for now per attending. Will need to continue close monitoring.   Goal of Therapy:  Anti-Xa 0.3-0.6 Monitor platelets by anticoagulation  protocol: Yes   Plan:  Continue Lovenox 55mg  (0.5 mg/kg/day) SQ qday Monitor for s/sx of bleeding Monitor daily CBC  10/27, PharmD PGY1 Acute Care Pharmacy Resident 01/06/2020 12:07 PM  Please check [email or web address] for unit-specific pharmacy phone numbers.

## 2020-01-06 NOTE — Progress Notes (Signed)
Pt Linda Richmond ETT holder removed. ETT resecured with protective barriers and cloth tape at 23cm at the lip. Pt then placed into the prone position with RT and RN x4. Pt tolerated well. RT will continue to monitor.

## 2020-01-07 DIAGNOSIS — Z978 Presence of other specified devices: Secondary | ICD-10-CM

## 2020-01-07 DIAGNOSIS — J9601 Acute respiratory failure with hypoxia: Secondary | ICD-10-CM

## 2020-01-07 LAB — POCT I-STAT 7, (LYTES, BLD GAS, ICA,H+H)
Acid-Base Excess: 8 mmol/L — ABNORMAL HIGH (ref 0.0–2.0)
Bicarbonate: 37.1 mmol/L — ABNORMAL HIGH (ref 20.0–28.0)
Calcium, Ion: 1.26 mmol/L (ref 1.15–1.40)
HCT: 31 % — ABNORMAL LOW (ref 36.0–46.0)
Hemoglobin: 10.5 g/dL — ABNORMAL LOW (ref 12.0–15.0)
O2 Saturation: 90 %
Patient temperature: 98.8
Potassium: 5.5 mmol/L — ABNORMAL HIGH (ref 3.5–5.1)
Sodium: 144 mmol/L (ref 135–145)
TCO2: 40 mmol/L — ABNORMAL HIGH (ref 22–32)
pCO2 arterial: 84.2 mmHg (ref 32.0–48.0)
pH, Arterial: 7.253 — ABNORMAL LOW (ref 7.350–7.450)
pO2, Arterial: 72 mmHg — ABNORMAL LOW (ref 83.0–108.0)

## 2020-01-07 LAB — COMPREHENSIVE METABOLIC PANEL
ALT: 123 U/L — ABNORMAL HIGH (ref 0–44)
AST: 53 U/L — ABNORMAL HIGH (ref 15–41)
Albumin: 1.6 g/dL — ABNORMAL LOW (ref 3.5–5.0)
Alkaline Phosphatase: 88 U/L (ref 38–126)
Anion gap: 5 (ref 5–15)
BUN: 47 mg/dL — ABNORMAL HIGH (ref 6–20)
CO2: 33 mmol/L — ABNORMAL HIGH (ref 22–32)
Calcium: 8.3 mg/dL — ABNORMAL LOW (ref 8.9–10.3)
Chloride: 107 mmol/L (ref 98–111)
Creatinine, Ser: 0.59 mg/dL (ref 0.44–1.00)
GFR calc Af Amer: >60 mL/min (ref >60)
GFR calc non Af Amer: >60 mL/min (ref >60)
Glucose, Bld: 167 mg/dL — ABNORMAL HIGH (ref 70–99)
Potassium: 5.2 mmol/L — ABNORMAL HIGH (ref 3.5–5.1)
Sodium: 145 mmol/L (ref 135–145)
Total Bilirubin: 0.5 mg/dL (ref 0.3–1.2)
Total Protein: 4.6 g/dL — ABNORMAL LOW (ref 6.5–8.1)

## 2020-01-07 LAB — CBC WITH DIFFERENTIAL/PLATELET
Abs Immature Granulocytes: 0.71 10*3/uL — ABNORMAL HIGH (ref 0.00–0.07)
Basophils Absolute: 0.1 10*3/uL (ref 0.0–0.1)
Basophils Relative: 1 %
Eosinophils Absolute: 0.1 10*3/uL (ref 0.0–0.5)
Eosinophils Relative: 1 %
HCT: 34.9 % — ABNORMAL LOW (ref 36.0–46.0)
Hemoglobin: 10.2 g/dL — ABNORMAL LOW (ref 12.0–15.0)
Immature Granulocytes: 9 %
Lymphocytes Relative: 14 %
Lymphs Abs: 1.1 10*3/uL (ref 0.7–4.0)
MCH: 30 pg (ref 26.0–34.0)
MCHC: 29.2 g/dL — ABNORMAL LOW (ref 30.0–36.0)
MCV: 102.6 fL — ABNORMAL HIGH (ref 80.0–100.0)
Monocytes Absolute: 0.4 10*3/uL (ref 0.1–1.0)
Monocytes Relative: 5 %
Neutro Abs: 5.3 10*3/uL (ref 1.7–7.7)
Neutrophils Relative %: 70 %
Platelets: 110 10*3/uL — ABNORMAL LOW (ref 150–400)
RBC: 3.4 MIL/uL — ABNORMAL LOW (ref 3.87–5.11)
RDW: 14.8 % (ref 11.5–15.5)
WBC Morphology: INCREASED
WBC: 7.6 10*3/uL (ref 4.0–10.5)
nRBC: 2.1 % — ABNORMAL HIGH (ref 0.0–0.2)

## 2020-01-07 LAB — HEPATIC FUNCTION PANEL
ALT: 135 U/L — ABNORMAL HIGH (ref 0–44)
AST: 71 U/L — ABNORMAL HIGH (ref 15–41)
Albumin: 1.6 g/dL — ABNORMAL LOW (ref 3.5–5.0)
Alkaline Phosphatase: 97 U/L (ref 38–126)
Bilirubin, Direct: 0.1 mg/dL (ref 0.0–0.2)
Indirect Bilirubin: 0.5 mg/dL (ref 0.3–0.9)
Total Bilirubin: 0.6 mg/dL (ref 0.3–1.2)
Total Protein: 4.4 g/dL — ABNORMAL LOW (ref 6.5–8.1)

## 2020-01-07 LAB — GLUCOSE, CAPILLARY
Glucose-Capillary: 132 mg/dL — ABNORMAL HIGH (ref 70–99)
Glucose-Capillary: 134 mg/dL — ABNORMAL HIGH (ref 70–99)
Glucose-Capillary: 153 mg/dL — ABNORMAL HIGH (ref 70–99)
Glucose-Capillary: 153 mg/dL — ABNORMAL HIGH (ref 70–99)
Glucose-Capillary: 154 mg/dL — ABNORMAL HIGH (ref 70–99)
Glucose-Capillary: 157 mg/dL — ABNORMAL HIGH (ref 70–99)
Glucose-Capillary: 158 mg/dL — ABNORMAL HIGH (ref 70–99)
Glucose-Capillary: 158 mg/dL — ABNORMAL HIGH (ref 70–99)
Glucose-Capillary: 160 mg/dL — ABNORMAL HIGH (ref 70–99)
Glucose-Capillary: 169 mg/dL — ABNORMAL HIGH (ref 70–99)
Glucose-Capillary: 178 mg/dL — ABNORMAL HIGH (ref 70–99)
Glucose-Capillary: 184 mg/dL — ABNORMAL HIGH (ref 70–99)
Glucose-Capillary: 188 mg/dL — ABNORMAL HIGH (ref 70–99)
Glucose-Capillary: 197 mg/dL — ABNORMAL HIGH (ref 70–99)
Glucose-Capillary: 199 mg/dL — ABNORMAL HIGH (ref 70–99)

## 2020-01-07 MED ORDER — PROSOURCE TF PO LIQD
45.0000 mL | Freq: Four times a day (QID) | ORAL | Status: DC
  Administered 2020-01-07 – 2020-01-13 (×23): 45 mL
  Filled 2020-01-07 (×22): qty 45

## 2020-01-07 MED ORDER — BISACODYL 10 MG RE SUPP
10.0000 mg | Freq: Every day | RECTAL | Status: DC | PRN
  Administered 2020-01-07: 10 mg via RECTAL
  Filled 2020-01-07: qty 1

## 2020-01-07 MED ORDER — FREE WATER
200.0000 mL | Freq: Four times a day (QID) | Status: DC
  Administered 2020-01-07 – 2020-01-12 (×19): 200 mL

## 2020-01-07 MED ORDER — OXYMETAZOLINE HCL 0.05 % NA SOLN
1.0000 | Freq: Once | NASAL | Status: AC
  Administered 2020-01-07: 1 via NASAL
  Filled 2020-01-07: qty 30

## 2020-01-07 MED ORDER — FUROSEMIDE 10 MG/ML IJ SOLN
40.0000 mg | Freq: Once | INTRAMUSCULAR | Status: AC
  Administered 2020-01-07: 40 mg via INTRAVENOUS
  Filled 2020-01-07: qty 4

## 2020-01-07 MED ORDER — VITAL 1.5 CAL PO LIQD
1000.0000 mL | ORAL | Status: DC
  Administered 2020-01-07 – 2020-01-12 (×6): 1000 mL
  Filled 2020-01-07 (×3): qty 1000

## 2020-01-07 NOTE — Progress Notes (Signed)
Critical ABG results given to Dr. Francine Graven. Verbal order received to prone pt when time appropriate. RT will continue to monitor.

## 2020-01-07 NOTE — Progress Notes (Signed)
Nutrition Follow-up  DOCUMENTATION CODES:   Morbid obesity  INTERVENTION:   Tube feeding via Cortrak:  -Vital 1.5 at 50 ml/hr (1200 ml per day) -ProSource 45 ml QID -Free water flushes 200 ml Q6  Provides 1960 kcal, 125 gm protein, 917 ml free water daily (1717 ml with free water)  NUTRITION DIAGNOSIS:   Increased nutrient needs related to acute illness (COVID-19) as evidenced by estimated needs.  Ongoing  GOAL:   Patient will meet greater than or equal to 90% of their needs  Addressed via TF  MONITOR:   PO intake, Supplement acceptance, Labs, Skin  REASON FOR ASSESSMENT:   Consult Enteral/tube feeding initiation and management  ASSESSMENT:   Linda Richmond  is a 54 y.o. female, with past medical history of anxiety, depression, diabetes, GERD, hypertension, vitamin D deficiency, negative rheumatoid arthritis, she presents to ED secondary to shortness of breath, reported began yesterday, patient reports for last week she started to have Covid symptoms including generalized body ache, headache, runny nose, nasal congestion, she was tested positive for COVID-19 on 8/27, prescribed Z-Pak and prednisone, she has finished both without much improvement, reports she started to have dyspnea yesterday, her son is admitted to [facility] due to COVID-19 as well, patient is unvaccinated, patient denies any chest pain, hemoptysis.  9/21 - NPO 9/22 - transferred to ICU, BiPAP 9/24 - Cortrak placement unsuccessful 9/25 - intubated, OG tube place  Pt discussed during ICU rounds and with RN.   CXR shows worsening bilateral infiltrates. Proned this am, now back supine. Remains on nimbex. Hypernatremia resolved, free water decreased per CCM. Placed on insulin drip due to elevated CBGs. Suspect this is largely due to steroids. Hopeful to see improvement once steroids d/c on 10/4. Per RN, pt underwent disimpaction this am and had some result. Suppository administered, awaiting further  results.   Successful Cortrak placement today. Transition to lower volume TF and increase ProSource to meet needs.   Patient remains intubated on ventilator support MV: 13.4 L/min Temp (24hrs), Avg:98 F (36.7 C), Min:97.6 F (36.4 C), Max:98.7 F (37.1 C)  Admission weight: 111.7 kg  Current weight: 109.1 kg   UOP: 1085 ml x 24 hrs   Drips: nimbex, insulin  Medications: colace, tradjenta, solumedrol, miralax, senokot, Vit D Labs: K 5.2 (H) CBG 137-187   Diet Order:   Diet Order    None      EDUCATION NEEDS:   No education needs have been identified at this time  Skin:  Skin Assessment: Skin Integrity Issues: Skin Integrity Issues:: Other (Comment) Other: multiple bruises abdomen  Last BM:  10/1  Height:   Ht Readings from Last 1 Encounters:  12/09/2019 5\' 2"  (1.575 m)    Weight:   Wt Readings from Last 1 Encounters:  01/07/20 109.1 kg    Ideal Body Weight:  50 kg  BMI:  Body mass index is 43.99 kg/m.  Estimated Nutritional Needs:   Kcal:  1870-2330  Protein:  110-125 grams  Fluid:  > 1.8 L  03/08/20 RD, LDN Clinical Nutrition Pager listed in AMION

## 2020-01-07 NOTE — Procedures (Signed)
Cortrak  Person Inserting Tube:  Staysha Truby E, RD Tube Type:  Cortrak - 43 inches Tube Location:  Right nare Initial Placement:  Stomach Secured by: Bridle Technique Used to Measure Tube Placement:  Documented cm marking at nare/ corner of mouth Cortrak Secured At:  75 cm    Cortrak Tube Team Note:  Consult received to place a Cortrak feeding tube.   No x-ray is required. RN may begin using tube.    If the tube becomes dislodged please keep the tube and contact the Cortrak team at [email or web address] ([email or web address]) for replacement.  If after hours and replacement cannot be delayed, place a NG tube and confirm placement with an abdominal x-ray.    Inessa Wardrop, MS, RD, LDN RD pager number and weekend/on-call pager number located in Amion.  

## 2020-01-07 NOTE — TOC Progression Note (Signed)
Transition of Care [facility]) - Progression Note    Patient Details  Name: JOYANNE EDDINGER MRN: [id number] Date of Birth: 01-27-1966  Transition of Care [facility]) CM/SW Contact  Lockie Pares, RN Phone Number: 01/07/2020, 3:42 PM  Clinical Narrative:    Patient remains on high vent settings paralytics, Proning. Overall continues with no improvement. Form COVID pneumonia.   Expected Discharge Plan: Home/Self Care Barriers to Discharge: Continued Medical Work up  Expected Discharge Plan and Services Expected Discharge Plan: Home/Self Care   Discharge Planning Services: CM Consult   Living arrangements for the past 2 months: Single Family Home                                       Social Determinants of Health (SDOH) Interventions    Readmission Risk Interventions No flowsheet data found.

## 2020-01-07 NOTE — Progress Notes (Addendum)
NAME:  Linda Richmond, MRN:  [id number], DOB:  23-Nov-1965, LOS: 28 ADMISSION DATE:  12/18/2019, CONSULTATION DATE:  12/28/2019 REFERRING MD:  Dr. Jerral Ralph, CHIEF COMPLAINT:  Hypoxic resp failure/ COVID  Brief History    54 year old female with history of DMT2, HTN, seronegative RA, anxiety, and depression who initially presented to [facility] on 9/3 with one week history of generalized body aches, headache, nasal congestion, and progressive shortness of breath.  She is unvaccinated.  Found to be COVID positive with saturations in the mid 80's on room air and CXR significant for multifocal opacities.  She was admitted by [facility].  On 9/6 she was transferred to [facility] given her increasing O2 requirements on NRB.  Hospitalization since complicated by Afib with RVR, and worsening respiratory failure.  Now unable to tolerate HHFNC beyond 50L and NRB with saturations in the high 70s/80's.  PCCM consulted for ICU transfer and likely BiPAP.  Past Medical History  DMT2, HTN, seronegative RA, anxiety, depression  Significant Hospital Events   9/3 Admitted to [facility] 9/6 Transferred to [facility] given worsening hypoxemia/ NRB 15L 9/8 Afib w/ RVR 9/19 cefepime added due to worsening leukocytosis/ persistent infiltrates  9/21 transitioned to [facility] and NRB/ PCCM consulted  9/24 - Tolerated day shift off of BiPAP . Tolerated BiPAP well at night. Tolerating clear liquids and feels ready to eat . Fialed cortrak with desats and epistaxis. Lovenox held 9/25  - INTUBATED  Consults:  Cardiology 9/9 PCCM  9/21  Procedures:  9/16 PICC > single-lumen only 9/26 - INTUBATION  Significant Diagnostic Tests:  9/3>>Chest x-ray: New bilateral mid to lower lung patchy opacities 9/9>> Echo: EF 60-65% 9/16>> chest x-ray: Airspace opacity throughout mid/lower lungs 9/16>> chest x-ray: Interval development of pneumomediastinum 9/17>> chest x-ray: Pneumomediastinum, tiny left apical pneumothorax-no change in bilateral airspace opacities 9/18>>  chest x-ray: Stable extensive patchy hazy opacities-no pneumothorax. 9/19>> chest x-ray: Persistent airspace opacities over mid to lower lungs bilaterally. 9/20>> chest x-ray: Persistent streaky infiltrates to bilateral lungs. 9/24 CXR> stable low lung volumes, but with some interval improvement to bilateral opacities  9/30 CXR with worsening diffuse bilateral infiltrates  Micro Data:  9/3 SARS2 >> positive  9/3 BCx 2 >> staph epi 1/2 9/13 MRSA PCR >> neg  Antimicrobials:  9/19 cefepime >>  baricitinib 9/5 >> 9/18  Decadron 9/3 > 9/4 Solumedrol 9/5 > 9/15; 9/20 > remdesivir 9/3 > 9/7  Interim history/subjective:  Proned, on 80%, sats are 94%, PF ratio 118 based on last ABG Remains on insulin gtt, Versed,Fentanyl,Nimbex BIS is 33 CBG's within 150 range, no further steroids>> will assess tube feeds as cause Hypernatremia resolving with free water AKI resolving. Creatinine 0.59  K 5.2, trending down from 6, got Lokelma 9/30 am  LFT's remain elevated but are down trending TOF is zero at 40 + 7 L T max 98.7, WBC 7.6, HGB 10.2,  platelets 110  Objective   Blood pressure 119/60, pulse 82, temperature 98.1 F (36.7 C), temperature source Axillary, resp. rate (!) 34, height 5\' 2"  (1.575 m), weight 109.1 kg, last menstrual period 04/18/2018, SpO2 94 %.    Vent Mode: PRVC FiO2 (%):  [75 %-90 %] 80 % Set Rate:  [34 bmp] 34 bmp Vt Set:  [410 mL] 410 mL PEEP:  [15 cmH20] 15 cmH20 Plateau Pressure:  [34 cmH20-41 cmH20] 41 cmH20   Intake/Output Summary (Last 24 hours) at 01/07/2020 0838 Last data filed at 01/07/2020 0600 Gross per 24 hour  Intake 2958.48 ml  Output 1085 ml  Net 1873.48 ml   Filed Weights   01/05/20 0200 01/06/20 0400 01/07/20 0600  Weight: 111.9 kg 112.5 kg 109.1 kg   Examination: General: critically ill-appearing obese female, prone  in bed on mechanical ventilation, NAD. HENT: PERRL, ETT and OGT in place. CV: normal rate and regular rhythm, no m/r/g Pulm:  Coarse, , no adventitious sounds noted posteriorly. Abdomen: Unable to assess as patient is proned Extremities: warm and dry, bruising Neuro: sedated and paralyzed   Resolved Hospital Problem list      Assessment & Plan:  OSA noncompliant with CPAP Acute hypoxic respiratory failure due to ARDS from COVID 19 pneumonia  01/07/2020  -on 80 % oxygen and PEEP of 15.  On fentanyl, versed, Nimbex  Infusion for vent synchrony.   Appears comfortable Net + 7L CXR with worsening infiltrates 9/30  Plan: -PRVC at 8 cc/kg per ideal body weight - Fentanyl , Versed , Nimbex for vent synchrony -VAP bundle -CXR in am and prn -routinely trend ABGs, one 10/1 am  -prn duonebs -f/u CBC - Lasix 40 mg x 1 on 10/1 - Consider re-culturing tracheal aspirate  Elevated Blood Sugars  On Insulin gtt Prednisone has been tapered( Last does 10/4) Plan Continue Insulin gtt CBG Q 1 Will decrease rate of TF and add Protein to meet needs to see if this will help   COVID-19 -Out of infectious contagion perid  Plan -Continue solumedrol taper - Last dose 10/4   Paroxysmal A. Fib [echocardiogram 12/16/2019: EF 65%]  - 01/07/2020  Sinus Rhythm to ST per tele  - Started on enteral lopressor  - Cardizem off 9/28 Plan -continue enteral lopressor -continue prophylactic dose Lovenox -Continue telemetry monitoring   AKI - resolving  PLAN -monitor renal indices  Hypernatremia Resolving Plan Will decrease free water to Q 6 replacement ( From Q 4)   Anxiety   Sleep disturbance Sedation needs on the ventilator  Plan -Sedation protocol with mechanical ventilation -Okay for RASS sedation score 0 and -3 and maintain adequate synchrony - TOF while paralyzed   Hypothyroidism  Continue daily armour    Poorly controlled diabetes  Plan: - insulin gtt - Tradjenta 5mg  daily  Elevated LFT's Down Trending Plan Monitor until normal    Vitamin D supplementation -currently 50,000 units once a  week  Plan -Check vitamin D level 01/03/2020   Best practice:  Diet: NPO, TF  Pain/Anxiety/Delirium protocol (if indicated): PAD protocol   VAP protocol (if indicated): n/a  DVT prophylaxis: lovenox full dose for A. Fib   GI prophylaxis: H2 blockade   glucose control: levemir, novolog, SSI  mobility: Bedrest   Code Status: full - 9.25_ Daughter at the bedside and son over telephone. Son asked about prognosis - explained high risk for mortality in ICU and if survives very likely looking at LTAC/Trach and attendant chronic critical illness -> he expressed that patient was never a fan of DNR and wants "everything done" and "fighting to end". For now full code.   Family Communication:   10/1: Son Guinevere Kulbacki updated.  Disposition: ICU   ATTESTATION & SIGNATURE    Bevelyn Ngo, MSN, AGACNP-BC [place] [facility] See Amion for personal pager PCCM on call pager [phone] 01/07/2020 8:38 AM   LABS    PULMONARY Recent Labs  Lab 01/05/20 1811 01/05/20 2122 01/06/20 0519 01/06/20 1553 01/06/20 2112  PHART 7.192* 7.257* 7.290* 7.278* 7.236*  PCO2ART 87.4* 77.9* 74.2* 76.1* 87.2*  PO2ART 95 101  96 69* 95  HCO3 33.1* 34.7* 35.7* 35.6* 37.1*  TCO2 36* 37* 38* 38* 40*  O2SAT 94.0 96.0 96.0 90.0 95.0    CBC Recent Labs  Lab 01/05/20 0215 01/05/20 0509 01/06/20 0320 01/06/20 0519 01/06/20 1553 01/06/20 2112 01/07/20 0421  HGB 10.9*   < > 9.3*   < > 9.9* 10.5* 10.2*  HCT 36.8   < > 32.3*   < > 29.0* 31.0* 34.9*  WBC 6.6  --  5.7  --   --   --  7.6  PLT 110*  --  85*  --   --   --  110*   < > = values in this interval not displayed.    COAGULATION No results for input(s): INR in the last 168 hours.  CARDIAC  No results for input(s): TROPONINI in the last 168 hours. No results for input(s): PROBNP in the last 168 hours.   CHEMISTRY Recent Labs  Lab 01/01/20 0433 01/01/20 0433 01/01/20 1231 01/02/20 0343 01/03/20 0418  01/03/20 2135 01/04/20 0459 01/04/20 9774 01/05/20 0215 01/05/20 0509 01/06/20 0320 01/06/20 0320 01/06/20 0519 01/06/20 0519 01/06/20 1553 01/06/20 1553 01/06/20 2112 01/07/20 0421  NA 143   < >   < >  --  144   < > 152*   < > 152*   < > 147*  --  147*  --  146*  --  146* 145  K 4.1   < >   < >  --  4.3   < > 4.2   < > 4.7   < > 5.8*   < > 5.7*   < > 5.6*   < > 6.0* 5.2*  CL 102   < >  --   --  108  --  113*  --  114*  --  109  --   --   --   --   --   --  107  CO2 24   < >  --   --  28  --  30  --  30  --  34*  --   --   --   --   --   --  33*  GLUCOSE 314*   < >  --   --  435*  --  142*  --  292*  --  150*  --   --   --   --   --   --  167*  BUN 62*   < >  --   --  84*  --  75*  --  71*  --  60*  --   --   --   --   --   --  47*  CREATININE 1.07*   < >  --   --  1.16*  --  0.88  --  0.92  --  0.75  --   --   --   --   --   --  0.59  CALCIUM 8.7*   < >  --   --  8.2*  --  8.8*  --  8.7*  --  8.2*  --   --   --   --   --   --  8.3*  MG  --   --   --   --  3.1*  --   --   --   --   --   --   --   --   --   --   --   --   --  PHOS 4.1  --   --  6.7* 3.7  --   --   --   --   --   --   --   --   --   --   --   --   --    < > = values in this interval not displayed.   Estimated Creatinine Clearance: 91.4 mL/min (by C-G formula based on SCr of 0.59 mg/dL).   LIVER Recent Labs  Lab 01/01/20 0433 01/03/20 0418 01/05/20 0215 01/06/20 0320 01/07/20 0421  AST 43* 27 89* 66* 53*  ALT 106* 83* 154* 131* 123*  ALKPHOS 102 89 113 94 88  BILITOT 1.3* 0.6 0.5 0.4 0.5  PROT 6.1* 5.2* 5.2* 4.4* 4.6*  ALBUMIN 2.6* 2.2* 1.9* 1.6* 1.6*     INFECTIOUS No results for input(s): LATICACIDVEN, PROCALCITON in the last 168 hours.   ENDOCRINE CBG (last 3)  Recent Labs    01/07/20 0320 01/07/20 0528 01/07/20 0807  GLUCAP 154* 158* 153*      IMAGING x48h  - image(s) personally visualized  -   highlighted in bold DG Chest Port 1 View  Result Date: 01/06/2020 CLINICAL DATA:  COVID  positive with respiratory failure. EXAM: PORTABLE CHEST 1 VIEW COMPARISON:  January 03, 2020 FINDINGS: There is stable endotracheal tube, nasogastric tube and right-sided PICC line positioning. Moderate severity diffuse bilateral infiltrates are seen. This is increased in severity when compared to the prior study. There is no evidence of a pleural effusion or pneumothorax. The cardiac silhouette is mildly enlarged and unchanged in size. Radiopaque surgical clips are seen overlying the right upper quadrant. The visualized skeletal structures are unremarkable. IMPRESSION: Moderate severity diffuse bilateral infiltrates, increased in severity when compared to the prior study. Electronically Signed   By: Aram Candela M.D.   On: 01/06/2020 20:05

## 2020-01-07 NOTE — Progress Notes (Signed)
MD notified:  1-No doc BM since 9/20 w/ sched Sennakot, Miralax, Colace.  Want to add Dulcolax supp?  2- Vital @ 48mL/h w/ Q4h H2O @ .  Pt is proned.  Do you want to modify rate for Prone position?

## 2020-01-07 NOTE — Progress Notes (Signed)
Pt now in supine position. No apparent skin breakdown to be noted. ETT secured by commercial tube holder and tube is at 22 at the lip. RT will continue to monitor pt.

## 2020-01-07 DEATH — deceased

## 2020-01-08 DIAGNOSIS — L8990 Pressure ulcer of unspecified site, unspecified stage: Secondary | ICD-10-CM | POA: Insufficient documentation

## 2020-01-08 LAB — CBC WITH DIFFERENTIAL/PLATELET
Abs Immature Granulocytes: 0.85 10*3/uL — ABNORMAL HIGH (ref 0.00–0.07)
Basophils Absolute: 0.1 10*3/uL (ref 0.0–0.1)
Basophils Relative: 1 %
Eosinophils Absolute: 0.2 10*3/uL (ref 0.0–0.5)
Eosinophils Relative: 2 %
HCT: 32.4 % — ABNORMAL LOW (ref 36.0–46.0)
Hemoglobin: 9.3 g/dL — ABNORMAL LOW (ref 12.0–15.0)
Immature Granulocytes: 11 %
Lymphocytes Relative: 14 %
Lymphs Abs: 1.1 10*3/uL (ref 0.7–4.0)
MCH: 29.6 pg (ref 26.0–34.0)
MCHC: 28.7 g/dL — ABNORMAL LOW (ref 30.0–36.0)
MCV: 103.2 fL — ABNORMAL HIGH (ref 80.0–100.0)
Monocytes Absolute: 0.3 10*3/uL (ref 0.1–1.0)
Monocytes Relative: 3 %
Neutro Abs: 5.4 10*3/uL (ref 1.7–7.7)
Neutrophils Relative %: 69 %
Platelets: 103 10*3/uL — ABNORMAL LOW (ref 150–400)
RBC: 3.14 MIL/uL — ABNORMAL LOW (ref 3.87–5.11)
RDW: 15.1 % (ref 11.5–15.5)
WBC: 7.7 10*3/uL (ref 4.0–10.5)
nRBC: 2.7 % — ABNORMAL HIGH (ref 0.0–0.2)

## 2020-01-08 LAB — GLUCOSE, CAPILLARY
Glucose-Capillary: 126 mg/dL — ABNORMAL HIGH (ref 70–99)
Glucose-Capillary: 147 mg/dL — ABNORMAL HIGH (ref 70–99)
Glucose-Capillary: 152 mg/dL — ABNORMAL HIGH (ref 70–99)
Glucose-Capillary: 156 mg/dL — ABNORMAL HIGH (ref 70–99)
Glucose-Capillary: 159 mg/dL — ABNORMAL HIGH (ref 70–99)
Glucose-Capillary: 160 mg/dL — ABNORMAL HIGH (ref 70–99)
Glucose-Capillary: 160 mg/dL — ABNORMAL HIGH (ref 70–99)
Glucose-Capillary: 160 mg/dL — ABNORMAL HIGH (ref 70–99)
Glucose-Capillary: 168 mg/dL — ABNORMAL HIGH (ref 70–99)
Glucose-Capillary: 170 mg/dL — ABNORMAL HIGH (ref 70–99)
Glucose-Capillary: 171 mg/dL — ABNORMAL HIGH (ref 70–99)
Glucose-Capillary: 179 mg/dL — ABNORMAL HIGH (ref 70–99)
Glucose-Capillary: 181 mg/dL — ABNORMAL HIGH (ref 70–99)
Glucose-Capillary: 208 mg/dL — ABNORMAL HIGH (ref 70–99)
Glucose-Capillary: 221 mg/dL — ABNORMAL HIGH (ref 70–99)

## 2020-01-08 LAB — POCT I-STAT 7, (LYTES, BLD GAS, ICA,H+H)
Acid-Base Excess: 8 mmol/L — ABNORMAL HIGH (ref 0.0–2.0)
Bicarbonate: 38 mmol/L — ABNORMAL HIGH (ref 20.0–28.0)
Calcium, Ion: 1.26 mmol/L (ref 1.15–1.40)
HCT: 29 % — ABNORMAL LOW (ref 36.0–46.0)
Hemoglobin: 9.9 g/dL — ABNORMAL LOW (ref 12.0–15.0)
O2 Saturation: 94 %
Patient temperature: 99.3
Potassium: 5.3 mmol/L — ABNORMAL HIGH (ref 3.5–5.1)
Sodium: 145 mmol/L (ref 135–145)
TCO2: 41 mmol/L — ABNORMAL HIGH (ref 22–32)
pCO2 arterial: 94.2 mmHg (ref 32.0–48.0)
pH, Arterial: 7.216 — ABNORMAL LOW (ref 7.350–7.450)
pO2, Arterial: 92 mmHg (ref 83.0–108.0)

## 2020-01-08 LAB — BLOOD GAS, ARTERIAL
Acid-Base Excess: 7.3 mmol/L — ABNORMAL HIGH (ref 0.0–2.0)
Bicarbonate: 34.5 mmol/L — ABNORMAL HIGH (ref 20.0–28.0)
FIO2: 80
O2 Saturation: 97.9 %
Patient temperature: 37
pCO2 arterial: 82.8 mmHg (ref 32.0–48.0)
pH, Arterial: 7.243 — ABNORMAL LOW (ref 7.350–7.450)
pO2, Arterial: 109 mmHg — ABNORMAL HIGH (ref 83.0–108.0)

## 2020-01-08 LAB — D-DIMER, QUANTITATIVE: D-Dimer, Quant: 4.23 ug/mL-FEU — ABNORMAL HIGH (ref 0.00–0.50)

## 2020-01-08 LAB — COMPREHENSIVE METABOLIC PANEL
ALT: 118 U/L — ABNORMAL HIGH (ref 0–44)
AST: 52 U/L — ABNORMAL HIGH (ref 15–41)
Albumin: 1.4 g/dL — ABNORMAL LOW (ref 3.5–5.0)
Alkaline Phosphatase: 82 U/L (ref 38–126)
Anion gap: 6 (ref 5–15)
BUN: 41 mg/dL — ABNORMAL HIGH (ref 6–20)
CO2: 33 mmol/L — ABNORMAL HIGH (ref 22–32)
Calcium: 7.7 mg/dL — ABNORMAL LOW (ref 8.9–10.3)
Chloride: 106 mmol/L (ref 98–111)
Creatinine, Ser: 0.55 mg/dL (ref 0.44–1.00)
GFR calc Af Amer: >60 mL/min (ref >60)
GFR calc non Af Amer: >60 mL/min (ref >60)
Glucose, Bld: 176 mg/dL — ABNORMAL HIGH (ref 70–99)
Potassium: 4.9 mmol/L (ref 3.5–5.1)
Sodium: 145 mmol/L (ref 135–145)
Total Bilirubin: 0.5 mg/dL (ref 0.3–1.2)
Total Protein: 4.1 g/dL — ABNORMAL LOW (ref 6.5–8.1)

## 2020-01-08 LAB — PROCALCITONIN: Procalcitonin: 0.28 ng/mL

## 2020-01-08 LAB — C-REACTIVE PROTEIN: CRP: 11.5 mg/dL — ABNORMAL HIGH (ref <1.0)

## 2020-01-08 LAB — MAGNESIUM: Magnesium: 2.1 mg/dL (ref 1.7–2.4)

## 2020-01-08 MED ORDER — SODIUM CHLORIDE 0.9 % IV SOLN
3.0000 g | Freq: Four times a day (QID) | INTRAVENOUS | Status: DC
  Administered 2020-01-09 – 2020-01-14 (×23): 3 g via INTRAVENOUS
  Filled 2020-01-08 (×23): qty 8

## 2020-01-08 MED ORDER — FUROSEMIDE 10 MG/ML IJ SOLN
40.0000 mg | Freq: Once | INTRAMUSCULAR | Status: AC
  Administered 2020-01-08: 40 mg via INTRAVENOUS
  Filled 2020-01-08: qty 4

## 2020-01-08 NOTE — Progress Notes (Signed)
eLink Physician-Brief Progress Note Patient Name: Linda Richmond DOB: [birth year] MRN: [id number]   Date of Service  01/08/2020  HPI/Events of Note  RN asks when to re-prone the patient. She was turned supine at 1430 today.  Most recent P:F is 136.   eICU Interventions  Patient should be proned at 2230 this evening for a period of 16 hours.     Intervention Category Minor Interventions: Routine modifications to care plan (e.g. PRN medications for pain, fever)  Marveen Reeks Dilana Mcphie 01/08/2020, 8:28 PM

## 2020-01-08 NOTE — Progress Notes (Signed)
Pharmacy Antibiotic Note  Linda Richmond is a 54 y.o. female admitted on 12/09/2019 with oral abscess.  Pharmacy has been consulted for unasyn dosing.  Plan: Unasyn 3gm IV q6 hours F/u renal function, cultures and clinical course  Height: 5\' 2"  (157.5 cm) Weight: 111.9 kg (246 lb 11.1 oz) IBW/kg (Calculated) : 50.1  Temp (24hrs), Avg:98.3 F (36.8 C), Min:97.5 F (36.4 C), Max:99.3 F (37.4 C)  Recent Labs  Lab 01/03/20 0022 01/04/20 0619 01/04/20 1320 01/05/20 0215 01/06/20 0320 01/07/20 0421 01/08/20 0315  WBC   < >  --  8.0 6.6 5.7 7.6 7.7  CREATININE  --  0.88  --  0.92 0.75 0.59 0.55   < > = values in this interval not displayed.    Estimated Creatinine Clearance: 92.7 mL/min (by C-G formula based on SCr of 0.55 mg/dL).    Allergies  Allergen Reactions  . Ancef [Cefazolin] Hives  . Aspirin Other (See Comments)    GI Bleed  . Doxycycline Diarrhea and Nausea And Vomiting  . Sulfa Antibiotics Hives    Possible loss of consciousness  . Ciprofloxacin Rash    "feels like bugs are crawling in my head"  . Lesinurad-Allopurinol Nausea And Vomiting  . Penicillins Rash    Thank you for allowing pharmacy to be a part of this patient's care.  03/09/20 [place] 01/08/2020 11:06 PM

## 2020-01-08 NOTE — Progress Notes (Signed)
PCCM progress note  Called by [facility] provider after nursing staff reported dislodged teeth after performing ETT assessment/routine oral care.  On arrival to room 2 teeth with what appeared to be bone attached were seen in bedside container with very foul-smelling odor.  On assessment bottom 2 front teeth have been dislodged with foul-smelling drainage and black cavities seen at gumline.  Adjacent teeth also seen in poor condition as well with high likelihood of dislodgment.  Given critical illness with ET tube present will start empiric antibiotics with Unasyn.  Patient will need dental evaluation in a.m.      Delfin Gant, NP-C [facility] Contact / Pager information can be found on Amion  01/08/2020, 10:51 PM

## 2020-01-08 NOTE — Progress Notes (Signed)
Pt returned to the supine position with RT and RN x4. Cloth tape removed and ETT hollister placed onto pt. ETT resecured at 23cm at the lip. No breakdown to the cheeks, upper lip/nose and tongue/gums. RT will continue to monitor.

## 2020-01-08 NOTE — Progress Notes (Signed)
When performing ETT assessment/routine oral care, 2 bottom front teeth became dislodged with appearance of bone present with teeth. Scant blood to site. Cavernous site left where teeth had been with purulent drainage, and foul odor. Adjacent teeth also in poor condition and appear loose, discolored grey, with very little gum line present.   Elink team notified.Dr. Benjamin Stain notified and camera assessed. Ground team has also  assessed at bedside. Per Dr. Benjamin Stain, dislodged teeth placed in denture box at bedside.  No orders received at this time aside from routine monitoring. Gums/tooth removal site gently cleansed.

## 2020-01-08 NOTE — Progress Notes (Signed)
NAME:  Linda Richmond, MRN:  [id number], DOB:  10-03-1965, LOS: 29 ADMISSION DATE:  12-20-19, CONSULTATION DATE:  12/28/2019 REFERRING MD:  Dr. Jerral Ralph, CHIEF COMPLAINT:  Hypoxic resp failure/ COVID  Brief History    54 year old female with history of DMT2, HTN, seronegative RA, anxiety, and depression who initially presented to [facility] on 9/3 with one week history of generalized body aches, headache, nasal congestion, and progressive shortness of breath.  She is unvaccinated.  Found to be COVID positive with saturations in the mid 80's on room air and CXR significant for multifocal opacities.  She was admitted by [facility].  On 9/6 she was transferred to [facility] given her increasing O2 requirements on NRB.  Hospitalization since complicated by Afib with RVR, and worsening respiratory failure.  Now unable to tolerate HHFNC beyond 50L and NRB with saturations in the high 70s/80's.  PCCM consulted for ICU transfer and likely BiPAP.  Past Medical History  DMT2, HTN, seronegative RA, anxiety, depression  Significant Hospital Events   9/3 Admitted to [facility] 9/6 Transferred to [facility] given worsening hypoxemia/ NRB 15L 9/8 Afib w/ RVR 9/19 cefepime added due to worsening leukocytosis/ persistent infiltrates  9/21 transitioned to [facility] and NRB/ PCCM consulted  9/24 - Tolerated day shift off of BiPAP . Tolerated BiPAP well at night. Tolerating clear liquids and feels ready to eat . Fialed cortrak with desats and epistaxis. Lovenox held 9/25  - INTUBATED  Consults:  Cardiology 9/9 PCCM  9/21  Procedures:  9/16 PICC > single-lumen only 9/26 - INTUBATION  Significant Diagnostic Tests:  9/3>>Chest x-ray: New bilateral mid to lower lung patchy opacities 9/9>> Echo: EF 60-65% 9/16>> chest x-ray: Airspace opacity throughout mid/lower lungs 9/16>> chest x-ray: Interval development of pneumomediastinum 9/17>> chest x-ray: Pneumomediastinum, tiny left apical pneumothorax-no change in bilateral airspace opacities 9/18>>  chest x-ray: Stable extensive patchy hazy opacities-no pneumothorax. 9/19>> chest x-ray: Persistent airspace opacities over mid to lower lungs bilaterally. 9/20>> chest x-ray: Persistent streaky infiltrates to bilateral lungs. 9/24 CXR> stable low lung volumes, but with some interval improvement to bilateral opacities  9/30 CXR with worsening diffuse bilateral infiltrates  Micro Data:  9/3 SARS2 >> positive  9/3 BCx 2 >> staph epi 1/2 9/13 MRSA PCR >> neg  Antimicrobials:  9/19 cefepime >>  baricitinib 9/5 >> 9/18  Decadron 9/3 > 9/4 Solumedrol 9/5 > 9/15; 9/20 > remdesivir 9/3 > 9/7  Interim history/subjective:  Proned overnight. No acute events.   Proned, on 80%, sats are 94%, PF ratio 118 based on last ABG Remains on insulin gtt, Versed,Fentanyl,Nimbex BIS is 33 CBG's within 150 range, no further steroids>> will assess tube feeds as cause Hypernatremia resolving with free water AKI resolving. Creatinine 0.59  K 5.2, trending down from 6, got Lokelma 9/30 am  LFT's remain elevated but are down trending TOF is zero at 40 + 7 L T max 98.7, WBC 7.6, HGB 10.2,  platelets 110  Objective   Blood pressure (!) 113/56, pulse 83, temperature 99.3 F (37.4 C), temperature source Axillary, resp. rate (!) 34, height 5\' 2"  (1.575 m), weight 111.9 kg, last menstrual period 04/18/2018, SpO2 97 %.    Vent Mode: PRVC FiO2 (%):  [80 %] 80 % Set Rate:  [34 bmp] 34 bmp Vt Set:  [410 mL] 410 mL PEEP:  [15 cmH20] 15 cmH20 Plateau Pressure:  [34 cmH20-36 cmH20] 34 cmH20   Intake/Output Summary (Last 24 hours) at 01/08/2020 1117 Last data filed at 01/08/2020 1000 Gross  per 24 hour  Intake 2295.51 ml  Output 2505 ml  Net -209.49 ml   Filed Weights   01/06/20 0400 01/07/20 0600 01/08/20 0432  Weight: 112.5 kg 109.1 kg 111.9 kg   Examination: General: critically ill-appearing obese female, prone  in bed on mechanical ventilation, NAD. HENT: PERRL, ETT and OGT in place. CV: normal rate  and regular rhythm, no m/r/g Pulm: Coarse, , no adventitious sounds noted posteriorly. Abdomen: Unable to assess as patient is proned Extremities: warm and dry, bruising Neuro: sedated and paralyzed   Resolved Hospital Problem list      Assessment & Plan:  OSA noncompliant with CPAP Acute hypoxic respiratory failure due to ARDS from COVID 19 pneumonia - Continue ARDSnet ventilation strategy with proning - Patient is developing increased dead space with rising PCO2 - Spot diuresis - Send tracheal aspirate - Check Ddimer and CRP - Check procalcitonin  Elevated Blood Sugars  On Insulin gtt Prednisone has been tapered( Last does 10/4) Plan Continue Insulin gtt CBG Q 1 Will decrease rate of TF and add Protein to meet needs to see if this will help  COVID-19 -Out of infectious contagion perid  Plan -Continue solumedrol taper - Last dose 10/4   Paroxysmal A. Fib [echocardiogram 12/16/2019: EF 65%]  - 01/08/2020  Sinus Rhythm to ST per tele  - Started on enteral lopressor  - Cardizem off 9/28 Plan -continue enteral lopressor -continue prophylactic dose Lovenox -Continue telemetry monitoring   AKI - resolving  PLAN -monitor renal indices  Hypernatremia Resolving Plan Will decrease free water to Q 6 replacement ( From Q 4)   Anxiety   Sleep disturbance Sedation needs on the ventilator  Plan -Sedation protocol with mechanical ventilation -Okay for RASS sedation score 0 and -3 and maintain adequate synchrony - TOF while paralyzed   Hypothyroidism  Continue daily armour    Poorly controlled diabetes  Plan: - insulin gtt - Tradjenta  daily  Elevated LFT's Down Trending Plan Monitor until normal    Vitamin D supplementation -currently 50,000 units once a week  Plan -Check vitamin D level 01/03/2020   Best practice:  Diet: NPO, TF  Pain/Anxiety/Delirium protocol (if indicated): PAD protocol   VAP protocol (if indicated): n/a  DVT  prophylaxis: lovenox full dose for A. Fib   GI prophylaxis: H2 blockade   glucose control: levemir, novolog, SSI  mobility: Bedrest   Code Status: full - 9.25_ Daughter at the bedside and son over telephone. Son asked about prognosis - explained high risk for mortality in ICU and if survives very likely looking at LTAC/Trach and attendant chronic critical illness -> he expressed that patient was never a fan of DNR and wants "everything done" and "fighting to end". For now full code.   Family Communication:   Will update family today Disposition: ICU   ATTESTATION & SIGNATURE    Melody Comas, MD [facility] Office: [phone]   See Amion for Pager Details    LABS    PULMONARY Recent Labs  Lab 01/06/20 0519 01/06/20 1553 01/06/20 2112 01/07/20 1635 01/08/20 0900  PHART 7.290* 7.278* 7.236* 7.253* 7.216*  PCO2ART 74.2* 76.1* 87.2* 84.2* 94.2*  PO2ART 96 69* 95 72* 92  HCO3 35.7* 35.6* 37.1* 37.1* 38.0*  TCO2 38* 38* 40* 40* 41*  O2SAT 96.0 90.0 95.0 90.0 94.0    CBC Recent Labs  Lab 01/06/20 0320 01/06/20 0519 01/07/20 0421 01/07/20 0421 01/07/20 1635 01/08/20 0315 01/08/20 0900  HGB 9.3*   < >  10.2*   < > 10.5* 9.3* 9.9*  HCT 32.3*   < > 34.9*   < > 31.0* 32.4* 29.0*  WBC 5.7  --  7.6  --   --  7.7  --   PLT 85*  --  110*  --   --  103*  --    < > = values in this interval not displayed.    COAGULATION No results for input(s): INR in the last 168 hours.  CARDIAC  No results for input(s): TROPONINI in the last 168 hours. No results for input(s): PROBNP in the last 168 hours.   CHEMISTRY Recent Labs  Lab 01/01/20 1231 01/02/20 0343 01/03/20 0418 01/03/20 2135 01/04/20 1610 01/04/20 9604 01/05/20 0215 01/05/20 0509 01/06/20 0320 01/06/20 0519 01/06/20 2112 01/06/20 2112 01/07/20 0421 01/07/20 0421 01/07/20 1635 01/07/20 1635 01/08/20 0315 01/08/20 0900  NA   < >  --  144   < > 152*   < > 152*   < > 147*    < > 146*  --  145  --  144  --  145 145  K   < >  --  4.3   < > 4.2   < > 4.7   < > 5.8*   < > 6.0*   < > 5.2*   < > 5.5*   < > 4.9 5.3*  CL   < >  --  108  --  113*  --  114*  --  109  --   --   --  107  --   --   --  106  --   CO2   < >  --  28  --  30  --  30  --  34*  --   --   --  33*  --   --   --  33*  --   GLUCOSE   < >  --  435*  --  142*  --  292*  --  150*  --   --   --  167*  --   --   --  176*  --   BUN   < >  --  84*  --  75*  --  71*  --  60*  --   --   --  47*  --   --   --  41*  --   CREATININE   < >  --  1.16*  --  0.88  --  0.92  --  0.75  --   --   --  0.59  --   --   --  0.55  --   CALCIUM   < >  --  8.2*  --  8.8*  --  8.7*  --  8.2*  --   --   --  8.3*  --   --   --  7.7*  --   MG  --   --  3.1*  --   --   --   --   --   --   --   --   --   --   --   --   --  2.1  --   PHOS  --  6.7* 3.7  --   --   --   --   --   --   --   --   --   --   --   --   --   --   --    < > =  values in this interval not displayed.   Estimated Creatinine Clearance: 92.7 mL/min (by C-G formula based on SCr of 0.55 mg/dL).   LIVER Recent Labs  Lab 01/05/20 0215 01/06/20 0320 01/07/20 0421 01/07/20 1722 01/08/20 0315  AST 89* 66* 53* 71* 52*  ALT 154* 131* 123* 135* 118*  ALKPHOS 113 94 88 97 82  BILITOT 0.5 0.4 0.5 0.6 0.5  PROT 5.2* 4.4* 4.6* 4.4* 4.1*  ALBUMIN 1.9* 1.6* 1.6* 1.6* 1.4*     INFECTIOUS No results for input(s): LATICACIDVEN, PROCALCITON in the last 168 hours.   ENDOCRINE CBG (last 3)  Recent Labs    01/08/20 0631 01/08/20 0846 01/08/20 1033  GLUCAP 156* 171* 160*      IMAGING x48h  - image(s) personally visualized  -   highlighted in bold DG Chest Port 1 View  Result Date: 01/06/2020 CLINICAL DATA:  COVID positive with respiratory failure. EXAM: PORTABLE CHEST 1 VIEW COMPARISON:  January 03, 2020 FINDINGS: There is stable endotracheal tube, nasogastric tube and right-sided PICC line positioning. Moderate severity diffuse bilateral infiltrates are seen.  This is increased in severity when compared to the prior study. There is no evidence of a pleural effusion or pneumothorax. The cardiac silhouette is mildly enlarged and unchanged in size. Radiopaque surgical clips are seen overlying the right upper quadrant. The visualized skeletal structures are unremarkable. IMPRESSION: Moderate severity diffuse bilateral infiltrates, increased in severity when compared to the prior study. Electronically Signed   By: Aram Candela M.D.   On: 01/06/2020 20:05

## 2020-01-09 ENCOUNTER — Other Ambulatory Visit (HOSPITAL_COMMUNITY): Payer: 59

## 2020-01-09 ENCOUNTER — Inpatient Hospital Stay (HOSPITAL_COMMUNITY): Payer: 59

## 2020-01-09 LAB — GLUCOSE, CAPILLARY
Glucose-Capillary: 139 mg/dL — ABNORMAL HIGH (ref 70–99)
Glucose-Capillary: 153 mg/dL — ABNORMAL HIGH (ref 70–99)
Glucose-Capillary: 156 mg/dL — ABNORMAL HIGH (ref 70–99)
Glucose-Capillary: 159 mg/dL — ABNORMAL HIGH (ref 70–99)
Glucose-Capillary: 159 mg/dL — ABNORMAL HIGH (ref 70–99)
Glucose-Capillary: 161 mg/dL — ABNORMAL HIGH (ref 70–99)
Glucose-Capillary: 170 mg/dL — ABNORMAL HIGH (ref 70–99)
Glucose-Capillary: 173 mg/dL — ABNORMAL HIGH (ref 70–99)
Glucose-Capillary: 173 mg/dL — ABNORMAL HIGH (ref 70–99)
Glucose-Capillary: 176 mg/dL — ABNORMAL HIGH (ref 70–99)
Glucose-Capillary: 200 mg/dL — ABNORMAL HIGH (ref 70–99)
Glucose-Capillary: 203 mg/dL — ABNORMAL HIGH (ref 70–99)
Glucose-Capillary: 204 mg/dL — ABNORMAL HIGH (ref 70–99)
Glucose-Capillary: 209 mg/dL — ABNORMAL HIGH (ref 70–99)

## 2020-01-09 LAB — POCT I-STAT 7, (LYTES, BLD GAS, ICA,H+H)
Acid-Base Excess: 11 mmol/L — ABNORMAL HIGH (ref 0.0–2.0)
Acid-Base Excess: 12 mmol/L — ABNORMAL HIGH (ref 0.0–2.0)
Bicarbonate: 39.9 mmol/L — ABNORMAL HIGH (ref 20.0–28.0)
Bicarbonate: 41.1 mmol/L — ABNORMAL HIGH (ref 20.0–28.0)
Calcium, Ion: 1.21 mmol/L (ref 1.15–1.40)
Calcium, Ion: 1.21 mmol/L (ref 1.15–1.40)
HCT: 30 % — ABNORMAL LOW (ref 36.0–46.0)
HCT: 29 % — ABNORMAL LOW (ref 36.0–46.0)
Hemoglobin: 10.2 g/dL — ABNORMAL LOW (ref 12.0–15.0)
Hemoglobin: 9.9 g/dL — ABNORMAL LOW (ref 12.0–15.0)
O2 Saturation: 93 %
O2 Saturation: 93 %
Patient temperature: 98.6
Patient temperature: 98
Potassium: 5.5 mmol/L — ABNORMAL HIGH (ref 3.5–5.1)
Potassium: 5.6 mmol/L — ABNORMAL HIGH (ref 3.5–5.1)
Sodium: 144 mmol/L (ref 135–145)
Sodium: 144 mmol/L (ref 135–145)
TCO2: 43 mmol/L — ABNORMAL HIGH (ref 22–32)
TCO2: 44 mmol/L — ABNORMAL HIGH (ref 22–32)
pCO2 arterial: 86.2 mmHg (ref 32.0–48.0)
pCO2 arterial: 83.1 mmHg (ref 32.0–48.0)
pH, Arterial: 7.274 — ABNORMAL LOW (ref 7.350–7.450)
pH, Arterial: 7.3 — ABNORMAL LOW (ref 7.350–7.450)
pO2, Arterial: 80 mmHg — ABNORMAL LOW (ref 83.0–108.0)
pO2, Arterial: 77 mmHg — ABNORMAL LOW (ref 83.0–108.0)

## 2020-01-09 LAB — CBC WITH DIFFERENTIAL/PLATELET
Abs Immature Granulocytes: 0.79 10*3/uL — ABNORMAL HIGH (ref 0.00–0.07)
Basophils Absolute: 0 10*3/uL (ref 0.0–0.1)
Basophils Relative: 1 %
Eosinophils Absolute: 0.1 10*3/uL (ref 0.0–0.5)
Eosinophils Relative: 1 %
HCT: 26.5 % — ABNORMAL LOW (ref 36.0–46.0)
Hemoglobin: 7.6 g/dL — ABNORMAL LOW (ref 12.0–15.0)
Immature Granulocytes: 10 %
Lymphocytes Relative: 9 %
Lymphs Abs: 0.7 10*3/uL (ref 0.7–4.0)
MCH: 30 pg (ref 26.0–34.0)
MCHC: 28.7 g/dL — ABNORMAL LOW (ref 30.0–36.0)
MCV: 104.7 fL — ABNORMAL HIGH (ref 80.0–100.0)
Monocytes Absolute: 0.4 10*3/uL (ref 0.1–1.0)
Monocytes Relative: 4 %
Neutro Abs: 6.3 10*3/uL (ref 1.7–7.7)
Neutrophils Relative %: 75 %
Platelets: 86 10*3/uL — ABNORMAL LOW (ref 150–400)
RBC: 2.53 MIL/uL — ABNORMAL LOW (ref 3.87–5.11)
RDW: 15.2 % (ref 11.5–15.5)
WBC: 8.3 10*3/uL (ref 4.0–10.5)
nRBC: 3.2 % — ABNORMAL HIGH (ref 0.0–0.2)

## 2020-01-09 MED ORDER — FUROSEMIDE 10 MG/ML IJ SOLN
40.0000 mg | Freq: Two times a day (BID) | INTRAMUSCULAR | Status: AC
  Administered 2020-01-09 (×2): 40 mg via INTRAVENOUS
  Filled 2020-01-09 (×2): qty 4

## 2020-01-09 MED ORDER — METHYLPREDNISOLONE SODIUM SUCC 40 MG IJ SOLR: 10.0000 mg | Freq: Every day | INTRAMUSCULAR | Status: DC

## 2020-01-09 MED ORDER — METHYLPREDNISOLONE SODIUM SUCC 40 MG IJ SOLR
20.0000 mg | Freq: Every day | INTRAMUSCULAR | Status: DC
  Administered 2020-01-09 – 2020-01-11 (×3): 20 mg via INTRAVENOUS
  Filled 2020-01-09 (×3): qty 0.5

## 2020-01-09 NOTE — Progress Notes (Signed)
Pt head repositioned per proning guidelines.  Airway remains patent and secure.

## 2020-01-09 NOTE — Progress Notes (Signed)
eLink Physician-Brief Progress Note Patient Name: Linda Richmond DOB: 19-Sep-1965 MRN: [id number]   Date of Service  01/09/2020  HPI/Events of Note  RN asked when next to prone the patient Was made supine around 3 pm or so 80%/peep 12 , current o2 sats acceptable, notes mention to continue prone cycle   eICU Interventions  Plan to prone around 11:30 pm and remain prone overnight      Intervention Category Major Interventions: Hypoxemia - evaluation and management  Oretha Milch 01/09/2020, 8:21 PM

## 2020-01-09 NOTE — Progress Notes (Signed)
Pt head repositioned per proning guidelines.  Airway remains patent and secure.  ABG critical result reported to CCM

## 2020-01-09 NOTE — Progress Notes (Signed)
NAME:  Linda Richmond, MRN:  [id number], DOB:  1965/12/23, LOS: 30 ADMISSION DATE:  January 06, 2020, CONSULTATION DATE:  12/28/2019 REFERRING MD:  Dr. Jerral Ralph, CHIEF COMPLAINT:  Hypoxic resp failure/ COVID  Brief History    54 year old female with history of DMT2, HTN, seronegative RA, anxiety, and depression who initially presented to [facility] on 9/3 with one week history of generalized body aches, headache, nasal congestion, and progressive shortness of breath.  She is unvaccinated.  Found to be COVID positive with saturations in the mid 80's on room air and CXR significant for multifocal opacities.  She was admitted by [facility].  On 9/6 she was transferred to [facility] given her increasing O2 requirements on NRB.  Hospitalization since complicated by Afib with RVR, and worsening respiratory failure.  Now unable to tolerate HHFNC beyond 50L and NRB with saturations in the high 70s/80's.  PCCM consulted for ICU transfer and likely BiPAP.  Past Medical History  DMT2, HTN, seronegative RA, anxiety, depression  Significant Hospital Events   9/3 Admitted to [facility] 9/6 Transferred to [facility] given worsening hypoxemia/ NRB 15L 9/8 Afib w/ RVR 9/19 cefepime added due to worsening leukocytosis/ persistent infiltrates  9/21 transitioned to [facility] and NRB/ PCCM consulted  9/24 - Tolerated day shift off of BiPAP . Tolerated BiPAP well at night. Tolerating clear liquids and feels ready to eat . Fialed cortrak with desats and epistaxis. Lovenox held 9/25  - INTUBATED  Consults:  Cardiology 9/9 PCCM  9/21  Procedures:  9/16 PICC > single-lumen only 9/26 - INTUBATION  Significant Diagnostic Tests:  9/3>>Chest x-ray: New bilateral mid to lower lung patchy opacities 9/9>> Echo: EF 60-65% 9/16>> chest x-ray: Airspace opacity throughout mid/lower lungs 9/16>> chest x-ray: Interval development of pneumomediastinum 9/17>> chest x-ray: Pneumomediastinum, tiny left apical pneumothorax-no change in bilateral airspace opacities 9/18>>  chest x-ray: Stable extensive patchy hazy opacities-no pneumothorax. 9/19>> chest x-ray: Persistent airspace opacities over mid to lower lungs bilaterally. 9/20>> chest x-ray: Persistent streaky infiltrates to bilateral lungs. 9/24 CXR> stable low lung volumes, but with some interval improvement to bilateral opacities  9/30 CXR with worsening diffuse bilateral infiltrates  Micro Data:  9/3 SARS2 >> positive  9/3 BCx 2 >> staph epi 1/2 9/13 MRSA PCR >> neg  Antimicrobials:  9/19 cefepime >>  baricitinib 9/5 >> 9/18  Decadron 9/3 > 9/4 Solumedrol 9/5 > 9/15; 9/20 > remdesivir 9/3 > 9/7  Interim history/subjective:   Overnight 2 teeth fell out and started on unasyn for dental disease. Is currently prone, P/F ratio proned is 100. Plateau Pressure 40.   Objective   Blood pressure (!) 124/58, pulse 81, temperature 98 F (36.7 C), temperature source Axillary, resp. rate (!) 34, height 5\' 2"  (1.575 m), weight 114.9 kg, last menstrual period 04/18/2018, SpO2 (!) 88 %.    Vent Mode: PRVC FiO2 (%):  [80 %] 80 % Set Rate:  [34 bmp] 34 bmp Vt Set:  [410 mL] 410 mL PEEP:  [15 cmH20] 15 cmH20 Plateau Pressure:  [30 cmH20-38 cmH20] 38 cmH20   Intake/Output Summary (Last 24 hours) at 01/09/2020 1105 Last data filed at 01/09/2020 0900 Gross per 24 hour  Intake 2503.22 ml  Output 2560 ml  Net -56.78 ml   Filed Weights   01/07/20 0600 01/08/20 0432 01/09/20 0415  Weight: 109.1 kg 111.9 kg 114.9 kg   Examination: General: critically ill-appearing obese female, prone in bed on mechanical ventilation, NAD. HENT: PERRL, ETT and OGT in place. CV: normal rate and  regular rhythm, no m/r/g Pulm: Coarse, , no adventitious sounds noted posteriorly. Abdomen: Unable to assess as patient is proned Extremities: warm and dry, bruising Neuro: sedated and paralyzed   Resolved Hospital Problem list      Assessment & Plan:  Acute hypoxic respiratory failure due to ARDS from COVID 19 pneumonia Hx  OSA noncompliant with CPAP - Continue ARDSnet ventilation strategy with proning - Patient is developing increased dead space with rising PCO2 - tapering steroids to 20mg  solumedrol daily today for 4 days, then to 10mg  for 4 days then off. - diuresis - Send tracheal aspirate  Elevated Blood Sugars  On Insulin gtt Steroids started 9/5, currently on 20mg  solumedrol daily Plan Continue Insulin gtt CBG Q 1  Paroxysmal A. Fib [echocardiogram 12/16/2019: EF 65%] - 01/09/2020  Sinus Rhythm to ST per tele  - Started on enteral lopressor  - Cardizem off 9/28 Plan -continue enteral lopressor -continue prophylactic dose Lovenox -Continue telemetry monitoring  AKI - resolving PLAN -monitor renal indices  Hypernatremia Resolving Free water 02/15/2020 q6hr  Anxiety   Sleep disturbance Sedation needs on the ventilator -Sedation protocol with mechanical ventilation -Okay for RASS sedation score 0 and -3 and maintain adequate synchrony - TOF while paralyzed  Hypothyroidism  Continue daily armour   Poorly controlled diabetes Plan: - insulin gtt - Tradjenta 5mg  daily  Elevated LFT's Down Trending Plan Monitor until normal  Vitamin D supplementation -currently 50,000 units once a week Plan -Check vitamin D level 01/03/2020   Best practice:  Diet: NPO, TF  Pain/Anxiety/Delirium protocol (if indicated): PAD protocol   VAP protocol (if indicated): n/a  DVT prophylaxis: lovenox full dose for A. Fib   GI prophylaxis: H2 blockade   glucose control: levemir, novolog, SSI  mobility: Bedrest   Code Status: full - 9.25_ Daughter at the bedside and son over telephone. Son asked about prognosis - explained high risk for mortality in ICU and if survives very likely looking at LTAC/Trach and attendant chronic critical illness -> he expressed that patient was never a fan of DNR and wants "everything done" and "fighting to end". For now full code.   Family Communication:   Will update  family today Disposition: ICU  LABS    PULMONARY Recent Labs  Lab 01/06/20 1553 01/06/20 1553 01/06/20 2112 01/07/20 1635 01/08/20 0900 01/08/20 1655 01/09/20 0320  PHART 7.278*   < > 7.236* 7.253* 7.216* 7.243* 7.274*  PCO2ART 76.1*   < > 87.2* 84.2* 94.2* 82.8* 86.2*  PO2ART 69*   < > 95 72* 92 109* 80*  HCO3 35.6*   < > 37.1* 37.1* 38.0* 34.5* 39.9*  TCO2 38*  --  40* 40* 41*  --  43*  O2SAT 90.0   < > 95.0 90.0 94.0 97.9 93.0   < > = values in this interval not displayed.    CBC Recent Labs  Lab 01/07/20 0421 01/07/20 1635 01/08/20 0315 01/08/20 0315 01/08/20 0900 01/09/20 0320 01/09/20 0409  HGB 10.2*   < > 9.3*   < > 9.9* 10.2* 7.6*  HCT 34.9*   < > 32.4*   < > 29.0* 30.0* 26.5*  WBC 7.6  --  7.7  --   --   --  8.3  PLT 110*  --  103*  --   --   --  86*   < > = values in this interval not displayed.    COAGULATION No results for input(s): INR in the last 168 hours.  CARDIAC  No results for input(s): TROPONINI in the last 168 hours. No results for input(s): PROBNP in the last 168 hours.   CHEMISTRY Recent Labs  Lab 01/03/20 0418 01/03/20 2135 01/04/20 8891 01/04/20 6945 01/05/20 0215 01/05/20 0509 01/06/20 0320 01/06/20 0519 01/07/20 0421 01/07/20 0421 01/07/20 1635 01/07/20 1635 01/08/20 0315 01/08/20 0315 01/08/20 0900 01/09/20 0320  NA 144   < > 152*   < > 152*   < > 147*   < > 145  --  144  --  145  --  145 144  K 4.3   < > 4.2   < > 4.7   < > 5.8*   < > 5.2*   < > 5.5*   < > 4.9   < > 5.3* 5.5*  CL 108  --  113*  --  114*  --  109  --  107  --   --   --  106  --   --   --   CO2 28  --  30  --  30  --  34*  --  33*  --   --   --  33*  --   --   --   GLUCOSE 435*  --  142*  --  292*  --  150*  --  167*  --   --   --  176*  --   --   --   BUN 84*  --  75*  --  71*  --  60*  --  47*  --   --   --  41*  --   --   --   CREATININE 1.16*  --  0.88  --  0.92  --  0.75  --  0.59  --   --   --  0.55  --   --   --   CALCIUM 8.2*  --  8.8*  --   8.7*  --  8.2*  --  8.3*  --   --   --  7.7*  --   --   --   MG 3.1*  --   --   --   --   --   --   --   --   --   --   --  2.1  --   --   --   PHOS 3.7  --   --   --   --   --   --   --   --   --   --   --   --   --   --   --    < > = values in this interval not displayed.   Estimated Creatinine Clearance: 94.2 mL/min (by C-G formula based on SCr of 0.55 mg/dL).   LIVER Recent Labs  Lab 01/05/20 0215 01/06/20 0320 01/07/20 0421 01/07/20 1722 01/08/20 0315  AST 89* 66* 53* 71* 52*  ALT 154* 131* 123* 135* 118*  ALKPHOS 113 94 88 97 82  BILITOT 0.5 0.4 0.5 0.6 0.5  PROT 5.2* 4.4* 4.6* 4.4* 4.1*  ALBUMIN 1.9* 1.6* 1.6* 1.6* 1.4*     INFECTIOUS Recent Labs  Lab 01/08/20 1249  PROCALCITON 0.28     ENDOCRINE CBG (last 3)  Recent Labs    01/09/20 0604 01/09/20 0755 01/09/20 1041  GLUCAP 176* 159* 170*    CC time: 40  Melody Comas, MD [facility] Office: [phone]  See Amion for Pager Details

## 2020-01-09 NOTE — Procedures (Signed)
Echo attempted. Patient prone. Will attempt again later.

## 2020-01-10 ENCOUNTER — Inpatient Hospital Stay (HOSPITAL_COMMUNITY): Payer: 59

## 2020-01-10 DIAGNOSIS — J80 Acute respiratory distress syndrome: Secondary | ICD-10-CM

## 2020-01-10 LAB — POCT I-STAT 7, (LYTES, BLD GAS, ICA,H+H)
Acid-Base Excess: 12 mmol/L — ABNORMAL HIGH (ref 0.0–2.0)
Acid-Base Excess: 14 mmol/L — ABNORMAL HIGH (ref 0.0–2.0)
Bicarbonate: 41.4 mmol/L — ABNORMAL HIGH (ref 20.0–28.0)
Bicarbonate: 42.5 mmol/L — ABNORMAL HIGH (ref 20.0–28.0)
Calcium, Ion: 1.17 mmol/L (ref 1.15–1.40)
Calcium, Ion: 1.19 mmol/L (ref 1.15–1.40)
HCT: 28 % — ABNORMAL LOW (ref 36.0–46.0)
HCT: 39 % (ref 36.0–46.0)
Hemoglobin: 13.3 g/dL (ref 12.0–15.0)
Hemoglobin: 9.5 g/dL — ABNORMAL LOW (ref 12.0–15.0)
O2 Saturation: 81 %
O2 Saturation: 98 %
Patient temperature: 98.6
Potassium: 5.2 mmol/L — ABNORMAL HIGH (ref 3.5–5.1)
Potassium: 5.6 mmol/L — ABNORMAL HIGH (ref 3.5–5.1)
Sodium: 145 mmol/L (ref 135–145)
Sodium: 147 mmol/L — ABNORMAL HIGH (ref 135–145)
TCO2: 44 mmol/L — ABNORMAL HIGH (ref 22–32)
TCO2: 45 mmol/L — ABNORMAL HIGH (ref 22–32)
pCO2 arterial: 79.8 mmHg (ref 32.0–48.0)
pCO2 arterial: 83.1 mmHg (ref 32.0–48.0)
pH, Arterial: 7.317 — ABNORMAL LOW (ref 7.350–7.450)
pH, Arterial: 7.323 — ABNORMAL LOW (ref 7.350–7.450)
pO2, Arterial: 131 mmHg — ABNORMAL HIGH (ref 83.0–108.0)
pO2, Arterial: 52 mmHg — ABNORMAL LOW (ref 83.0–108.0)

## 2020-01-10 LAB — CBC WITH DIFFERENTIAL/PLATELET
Abs Immature Granulocytes: 0.65 10*3/uL — ABNORMAL HIGH (ref 0.00–0.07)
Basophils Absolute: 0 10*3/uL (ref 0.0–0.1)
Basophils Relative: 0 %
Eosinophils Absolute: 0.1 10*3/uL (ref 0.0–0.5)
Eosinophils Relative: 1 %
HCT: 28.4 % — ABNORMAL LOW (ref 36.0–46.0)
Hemoglobin: 8.4 g/dL — ABNORMAL LOW (ref 12.0–15.0)
Immature Granulocytes: 7 %
Lymphocytes Relative: 13 %
Lymphs Abs: 1.2 10*3/uL (ref 0.7–4.0)
MCH: 31.1 pg (ref 26.0–34.0)
MCHC: 29.6 g/dL — ABNORMAL LOW (ref 30.0–36.0)
MCV: 105.2 fL — ABNORMAL HIGH (ref 80.0–100.0)
Monocytes Absolute: 0.4 10*3/uL (ref 0.1–1.0)
Monocytes Relative: 4 %
Neutro Abs: 6.8 10*3/uL (ref 1.7–7.7)
Neutrophils Relative %: 75 %
Platelets: 100 10*3/uL — ABNORMAL LOW (ref 150–400)
RBC: 2.7 MIL/uL — ABNORMAL LOW (ref 3.87–5.11)
RDW: 15.7 % — ABNORMAL HIGH (ref 11.5–15.5)
WBC: 9.1 10*3/uL (ref 4.0–10.5)
nRBC: 2.1 % — ABNORMAL HIGH (ref 0.0–0.2)

## 2020-01-10 LAB — GLUCOSE, CAPILLARY
Glucose-Capillary: 120 mg/dL — ABNORMAL HIGH (ref 70–99)
Glucose-Capillary: 127 mg/dL — ABNORMAL HIGH (ref 70–99)
Glucose-Capillary: 137 mg/dL — ABNORMAL HIGH (ref 70–99)
Glucose-Capillary: 144 mg/dL — ABNORMAL HIGH (ref 70–99)
Glucose-Capillary: 149 mg/dL — ABNORMAL HIGH (ref 70–99)
Glucose-Capillary: 150 mg/dL — ABNORMAL HIGH (ref 70–99)
Glucose-Capillary: 155 mg/dL — ABNORMAL HIGH (ref 70–99)
Glucose-Capillary: 159 mg/dL — ABNORMAL HIGH (ref 70–99)
Glucose-Capillary: 164 mg/dL — ABNORMAL HIGH (ref 70–99)
Glucose-Capillary: 166 mg/dL — ABNORMAL HIGH (ref 70–99)
Glucose-Capillary: 168 mg/dL — ABNORMAL HIGH (ref 70–99)
Glucose-Capillary: 175 mg/dL — ABNORMAL HIGH (ref 70–99)
Glucose-Capillary: 176 mg/dL — ABNORMAL HIGH (ref 70–99)
Glucose-Capillary: 177 mg/dL — ABNORMAL HIGH (ref 70–99)
Glucose-Capillary: 178 mg/dL — ABNORMAL HIGH (ref 70–99)
Glucose-Capillary: 195 mg/dL — ABNORMAL HIGH (ref 70–99)
Glucose-Capillary: 215 mg/dL — ABNORMAL HIGH (ref 70–99)

## 2020-01-10 MED ORDER — INSULIN GLARGINE 100 UNIT/ML ~~LOC~~ SOLN
20.0000 [IU] | Freq: Two times a day (BID) | SUBCUTANEOUS | Status: DC
  Administered 2020-01-10 – 2020-01-11 (×4): 20 [IU] via SUBCUTANEOUS
  Filled 2020-01-10 (×6): qty 0.2

## 2020-01-10 NOTE — Progress Notes (Addendum)
NAME:  Linda Richmond, MRN:  [id number], DOB:  02-09-1966, LOS: 31 ADMISSION DATE:  12/17/2019, CONSULTATION DATE:  12/28/2019 REFERRING MD:  Dr. Jerral Richmond, CHIEF COMPLAINT:  Hypoxic respiratory failure 2/2 COVID-19  Brief History   54yo female with T2DM, HTN, seronegative RA, anxiety, and depression who presented to [facility] 9/3 with 1 week history of body aches, headache, nasal congestion, and shortness of breath. In ED, saturations in mid-80's on room air, CXR with multifocal opacities, COVID-19 positive. Of note, patient is unvaccinated. Subsequently admitted to [facility] then trasferred to [facility] due to increasing oxygen requirements. Patient's hospital course complicated with Afib with RVR, worsening respiratory failure. Unable to tolerate HHFNC beyond 50L and NRB with saturations in 70's-80's. PCCM consulted for ICU transfer  Past Medical History  T2DM HTN Seronegative RA Anxiety Depression  Significant Hospital Events   9/3 Admitted to [facility] 9/6 Transferred to [facility] given increased oxygen requirements 9/8 Afib with RVR 9/19 Leukocytosis worsening, infiltrates persistent > cefepime addede 9/21 HHFNC and NRB required, PCCM consulted 9/24 Tolerated day off BiPAP and night on BiPAP. Tolerated clear liquids. Failed cortrax with desat, epistaxis 9/25 Intubated  Consults:  Cardiology PCCM  Procedures:  9/16 Single-lumen PICC placement 9/26 Intubation  Significant Diagnostic Tests:  9/3 CXR, bilateral mid-lower lung opacities 9/9 TTE: EF 60-65% 9/16 CXR, airspace opacity throughout mid/lower lungs 9/16 :CXR, interval development of pneumomediastinum 9/17 CXR pneumomediatinum, tiny left apical pneumothorax 9/18 CXR stable, extensive patchy hazy opacities, no pneumonthorax 9/19 CXR persistent airspace opacities over mid-lower lungs bilaterally 9/20 CXR persistent streaky infiltrates to bilateral lungs 9/24 CXR > stable low lung volumes, some interval improvement to bilateral opacities 9/30 CXR with  worsening diffuse bilateral infiltrates  Micro Data:  9/3 COVID SARS2 > positive 9/3 BCx 2 > staph epi 1/2 9/13 MRSA PCR > negative  Antimicrobials:  Cefepime 9/19- Unasyn 10/3-  Baricitinib 9/5-9/18 Decadron 9/3-9/4 Solumedrol 9/5-9/15, 9/20- Remdesivir 9/3-9/7  Interim history/subjective:  No acute events overnight. O/N ABG revealed 7.32 / 80 / 41. P/F ratio proned is _. Plateau pressure _.  Objective   Blood pressure (!) 174/76, pulse (!) 106, temperature 97.8 F (36.6 C), temperature source Axillary, resp. rate (!) 34, height 5\' 2"  (1.575 m), weight 115.7 kg, last menstrual period 04/18/2018, SpO2 98 %.    Vent Mode: PRVC FiO2 (%):  [80 %-100 %] 80 % Set Rate:  [34 bmp] 34 bmp Vt Set:  [410 mL] 410 mL PEEP:  [12 cmH20-15 cmH20] 12 cmH20 Plateau Pressure:  [30 cmH20-36 cmH20] 33 cmH20   Intake/Output Summary (Last 24 hours) at 01/10/2020 1042 Last data filed at 01/10/2020 0800 Gross per 24 hour  Intake 3008.98 ml  Output 1200 ml  Net 1808.98 ml   Filed Weights   01/08/20 0432 01/09/20 0415 01/10/20 0500  Weight: 111.9 kg 114.9 kg 115.7 kg    Examination: General: Proned, intubated, sedated HENT: Normocephalic, atraumatic Lungs: Inspiratory wheezing bilaterally Cardiovascular: Normal rate, rhythm. Abdomen: Unable to assess due to patient's proned position Extremities: Warm, dry Neuro: Sedated, unresponsive to stimuli  Resolved Hospital Problem list   Acute Kidney Injury  Assessment & Plan:  Linda Richmond is 54yo female with history of diabetes, hypertension, anxiety, depression presented 9/3 with COVID-19 hypoxic respiratory failure, complicated by Afib w/ RVR, currently mechanically ventilated with hypercapnic respiratory acidosis.  #Acute hypoxic respiratory failure d/t ARDS 2/2 COVID-19 PNA #Hx OSA, non-compliant w/ CPAP -ARDSnet ventilation strategy, including proning -Latest ABG 7.32 / 80 / 41, slightly improved since day  prior -Solumedrol 20mg  taper  (Day 2/4) -> 10mg  the following four days  #Hyperglycemia on steroids #Hx of T2DM -C/w insulin gtt, linagliptin 5mg  qd -Adding Lantus 20U BID -CBG q1hr  #Paroxysmal Afib -TTE 9/9: EF 60-65% -Previously on cardizem, d/c 9/28 -C/w enteral lopressor, prophylactic dose Lovenox -Tele  #Hypernatremia -Last CMP 01/08/20 -Na 145 > 144 > 145 -Re-check CMP -C/w free water 11/9 6hr  #Anxiety #Sleep disturbance -Sedation protocol with mechanical ventilation -Okay for RASS sedation score between 0, -3 -TOF while paralyzed  -Buspar 15mg  TID  #Dislodged teeth -Patient lost two teeth 10/2 after routine oral care performed -Poor dentition noted, foul-smelling odor present -C/w Unasyn (day 3)  #Transaminitis -Down-trending at last check 10/2 -Will re-check CMP and monitor  #Hypothyroidism -C/w armour daily  #Vitamin D supplementation -Takes 50,000U weekly at home (given on 10/2) -Vit D 41 01/03/20  Best practice:  Diet: NPO, TF Pain/Anxiety/Delirium protocol (if indicated): PAD protocol VAP protocol (if indicated): n/a DVT prophylaxis: Lovenox GI prophylaxis: H2 blockade Glucose control: Lantus, linagliptin, SSI Mobility: bed rest Code Status: Full 12/2 9/25 with son, daughter. Son asked about prognosis, was explained high risk for mortality in ICU and if survives likely looking at LTAC/Trach and attendant chronic critical illness. He expressed patient was never a fan of DNR, wants everything done and fighting to the end.) Family Communication: not today Disposition:   Labs   CBC: Recent Labs  Lab 01/06/20 0320 01/06/20 0519 01/07/20 0421 01/07/20 1635 01/08/20 0315 01/08/20 0900 01/09/20 0320 01/09/20 0409 01/09/20 1632 01/10/20 0040 01/10/20 0557  WBC 5.7  --  7.6  --  7.7  --   --  8.3  --   --  9.1  NEUTROABS 4.5  --  5.3  --  5.4  --   --  6.3  --   --  6.8  HGB 9.3*   < > 10.2*   < > 9.3*   < > 10.2* 7.6* 9.9* 13.3 8.4*  HCT 32.3*   < > 34.9*   < >  32.4*   < > 30.0* 26.5* 29.0* 39.0 28.4*  MCV 102.9*  --  102.6*  --  103.2*  --   --  104.7*  --   --  105.2*  PLT 85*  --  110*  --  103*  --   --  86*  --   --  100*   < > = values in this interval not displayed.    Basic Metabolic Panel: Recent Labs  Lab 01/04/20 0619 01/04/20 03/11/20 01/05/20 0215 01/05/20 0509 01/06/20 0320 01/06/20 0519 01/07/20 0421 01/07/20 1635 01/08/20 0315 01/08/20 0900 01/09/20 0320 01/09/20 1632 01/10/20 0040  NA 152*   < > 152*   < > 147*   < > 145   < > 145 145 144 144 145  K 4.2   < > 4.7   < > 5.8*   < > 5.2*   < > 4.9 5.3* 5.5* 5.6* 5.2*  CL 113*  --  114*  --  109  --  107  --  106  --   --   --   --   CO2 30  --  30  --  34*  --  33*  --  33*  --   --   --   --   GLUCOSE 142*  --  292*  --  150*  --  167*  --  176*  --   --   --   --  BUN 75*  --  71*  --  60*  --  47*  --  41*  --   --   --   --   CREATININE 0.88  --  0.92  --  0.75  --  0.59  --  0.55  --   --   --   --   CALCIUM 8.8*  --  8.7*  --  8.2*  --  8.3*  --  7.7*  --   --   --   --   MG  --   --   --   --   --   --   --   --  2.1  --   --   --   --    < > = values in this interval not displayed.   GFR: Estimated Creatinine Clearance: 94.6 mL/min (by C-G formula based on SCr of 0.55 mg/dL). Recent Labs  Lab 01/07/20 0421 01/08/20 0315 01/08/20 1249 01/09/20 0409 01/10/20 0557  PROCALCITON  --   --  0.28  --   --   WBC 7.6 7.7  --  8.3 9.1    Liver Function Tests: Recent Labs  Lab 01/05/20 0215 01/06/20 0320 01/07/20 0421 01/07/20 1722 01/08/20 0315  AST 89* 66* 53* 71* 52*  ALT 154* 131* 123* 135* 118*  ALKPHOS 113 94 88 97 82  BILITOT 0.5 0.4 0.5 0.6 0.5  PROT 5.2* 4.4* 4.6* 4.4* 4.1*  ALBUMIN 1.9* 1.6* 1.6* 1.6* 1.4*   No results for input(s): LIPASE, AMYLASE in the last 168 hours. No results for input(s): AMMONIA in the last 168 hours.  ABG    Component Value Date/Time   PHART 7.323 (L) 01/10/2020 0040   PCO2ART 79.8 (HH) 01/10/2020 0040   PO2ART  131 (H) 01/10/2020 0040   HCO3 41.4 (H) 01/10/2020 0040   TCO2 44 (H) 01/10/2020 0040   O2SAT 98.0 01/10/2020 0040      Coagulation Profile: No results for input(s): INR, PROTIME in the last 168 hours.  Cardiac Enzymes: No results for input(s): CKTOTAL, CKMB, CKMBINDEX, TROPONINI in the last 168 hours.  HbA1C: Hgb A1c MFr Bld  Date/Time Value Ref Range Status  01/01/2020 03:44 PM 10.8 (H) 4.8 - 5.6 % Final    Comment:    (NOTE) Pre diabetes:          5.7%-6.4%  Diabetes:              >6.4%  Glycemic control for   <7.0% adults with diabetes     CBG: Recent Labs  Lab 01/10/20 0318 01/10/20 0418 01/10/20 0515 01/10/20 0615 01/10/20 0828  GLUCAP 137* 159* 176* 149* 150*    Review of Systems:   Unable to assess given sedated, intubated.  Past Medical History  She,  has a past medical history of Anxiety, Depression, Diabetes mellitus without complication (HCC), GERD (gastroesophageal reflux disease), Hypertension, Hypothyroidism, Palpitations, and Rheumatoid arthritis (HCC).   Surgical History    Past Surgical History:  Procedure Laterality Date   BLADDER SURGERY     X's 3 for interstitial cystitis   CHOLECYSTECTOMY       Social History   reports that she has never smoked. She has never used smokeless tobacco. She reports that she does not drink alcohol and does not use drugs.   Family History   Her family history includes High blood pressure in her mother; Pancreatic cancer in her father.   Allergies Allergies  Allergen Reactions  Ancef [Cefazolin] Hives   Aspirin Other (See Comments)    GI Bleed   Doxycycline Diarrhea and Nausea And Vomiting   Sulfa Antibiotics Hives    Possible loss of consciousness   Ciprofloxacin Rash    "feels like bugs are crawling in my head"   Lesinurad-Allopurinol Nausea And Vomiting   Penicillins Rash     Home Medications  Prior to Admission medications   Medication Sig Start Date End Date Taking? Authorizing  Provider  ALPRAZolam Prudy Feeler) 0.5 MG tablet Take 0.25-0.5 mg by mouth daily as needed for anxiety.    Yes [provider]  busPIRone (BUSPAR) 15 MG tablet Take 1 tablet by mouth in the morning, at noon, and at bedtime. 07/19/19  Yes [provider]  escitalopram (LEXAPRO) 5 MG tablet Take 5 mg by mouth daily. 07/19/19  Yes [provider]  levothyroxine (SYNTHROID) 25 MCG tablet Take 25 mcg by mouth daily. 10/04/19  Yes [provider]  olmesartan (BENICAR) 20 MG tablet Take 20 mg by mouth 2 (two) times daily.   Yes [provider]  pantoprazole (PROTONIX) 40 MG tablet Take 40 mg by mouth daily as needed (for GERD/Avid reflux).    Yes [provider]  Vitamin D, Ergocalciferol, (DRISDOL) 50000 units CAPS capsule  07/17/16  Yes [provider]  apixaban (ELIQUIS) 5 MG TABS tablet Take 1 tablet (5 mg total) by mouth 2 (two) times daily. 12/23/19   Ghimire, Werner Lean, MD  furosemide (LASIX) 20 MG tablet Take 1 tablet (20 mg total) by mouth daily as needed for edema (swelling). 08/23/16 11/21/16  Antoine Poche, MD  thyroid (ARMOUR) 30 MG tablet Take 30 mg by mouth daily before breakfast. Patient not taking: Reported on 12/11/2019    [provider]     Critical care time: 30 minutes

## 2020-01-10 NOTE — Progress Notes (Signed)
Cloth tape removed from pt face. No breakdown noted to cheeks/ upper lip/ tongue or gums. ETT hollister placed and ETT resecured at 23cm. RT will continue to monitor.

## 2020-01-10 NOTE — Progress Notes (Signed)
Pt returned to the supine position with RT x2 and RN x3. ETT secure at 23cm at the lip. RT will continue to monitor.

## 2020-01-10 NOTE — Progress Notes (Signed)
Pt pronated 01/09/2020 @ appx 2330.  1hr followup ABG 7.23;80;131;41;+12;98% on PRVC 34/410/+12/90%  FiO2 titrated to 80%  Critical CO2 reported to EICU.  Pt repositioned 01/10/2020 @0140 

## 2020-01-10 NOTE — Progress Notes (Signed)
  Echocardiogram 2D Echocardiogram has been attempted. Patient prone. Nurse stated to return after 3pm.  Linda Richmond G Linda Richmond 01/10/2020, 11:53 AM

## 2020-01-11 ENCOUNTER — Inpatient Hospital Stay (HOSPITAL_COMMUNITY): Payer: 59

## 2020-01-11 DIAGNOSIS — J80 Acute respiratory distress syndrome: Secondary | ICD-10-CM | POA: Diagnosis not present

## 2020-01-11 DIAGNOSIS — J9620 Acute and chronic respiratory failure, unspecified whether with hypoxia or hypercapnia: Secondary | ICD-10-CM | POA: Diagnosis not present

## 2020-01-11 DIAGNOSIS — E119 Type 2 diabetes mellitus without complications: Secondary | ICD-10-CM | POA: Diagnosis not present

## 2020-01-11 LAB — COMPREHENSIVE METABOLIC PANEL
ALT: 97 U/L — ABNORMAL HIGH (ref 0–44)
AST: 47 U/L — ABNORMAL HIGH (ref 15–41)
Albumin: 1.5 g/dL — ABNORMAL LOW (ref 3.5–5.0)
Alkaline Phosphatase: 88 U/L (ref 38–126)
Anion gap: 6 (ref 5–15)
BUN: 37 mg/dL — ABNORMAL HIGH (ref 6–20)
CO2: 36 mmol/L — ABNORMAL HIGH (ref 22–32)
Calcium: 7.6 mg/dL — ABNORMAL LOW (ref 8.9–10.3)
Chloride: 105 mmol/L (ref 98–111)
Creatinine, Ser: 0.44 mg/dL (ref 0.44–1.00)
GFR calc Af Amer: >60 mL/min (ref >60)
GFR calc non Af Amer: >60 mL/min (ref >60)
Glucose, Bld: 133 mg/dL — ABNORMAL HIGH (ref 70–99)
Potassium: 5.1 mmol/L (ref 3.5–5.1)
Sodium: 147 mmol/L — ABNORMAL HIGH (ref 135–145)
Total Bilirubin: 0.5 mg/dL (ref 0.3–1.2)
Total Protein: 4.5 g/dL — ABNORMAL LOW (ref 6.5–8.1)

## 2020-01-11 LAB — GLUCOSE, CAPILLARY
Glucose-Capillary: 123 mg/dL — ABNORMAL HIGH (ref 70–99)
Glucose-Capillary: 130 mg/dL — ABNORMAL HIGH (ref 70–99)
Glucose-Capillary: 131 mg/dL — ABNORMAL HIGH (ref 70–99)
Glucose-Capillary: 139 mg/dL — ABNORMAL HIGH (ref 70–99)
Glucose-Capillary: 142 mg/dL — ABNORMAL HIGH (ref 70–99)
Glucose-Capillary: 142 mg/dL — ABNORMAL HIGH (ref 70–99)
Glucose-Capillary: 148 mg/dL — ABNORMAL HIGH (ref 70–99)
Glucose-Capillary: 152 mg/dL — ABNORMAL HIGH (ref 70–99)
Glucose-Capillary: 153 mg/dL — ABNORMAL HIGH (ref 70–99)
Glucose-Capillary: 153 mg/dL — ABNORMAL HIGH (ref 70–99)
Glucose-Capillary: 158 mg/dL — ABNORMAL HIGH (ref 70–99)
Glucose-Capillary: 165 mg/dL — ABNORMAL HIGH (ref 70–99)
Glucose-Capillary: 172 mg/dL — ABNORMAL HIGH (ref 70–99)
Glucose-Capillary: 172 mg/dL — ABNORMAL HIGH (ref 70–99)
Glucose-Capillary: 175 mg/dL — ABNORMAL HIGH (ref 70–99)
Glucose-Capillary: 185 mg/dL — ABNORMAL HIGH (ref 70–99)
Glucose-Capillary: 198 mg/dL — ABNORMAL HIGH (ref 70–99)

## 2020-01-11 LAB — CBC
HCT: 25 % — ABNORMAL LOW (ref 36.0–46.0)
HCT: 27 % — ABNORMAL LOW (ref 36.0–46.0)
Hemoglobin: 7.1 g/dL — ABNORMAL LOW (ref 12.0–15.0)
Hemoglobin: 7.7 g/dL — ABNORMAL LOW (ref 12.0–15.0)
MCH: 30.1 pg (ref 26.0–34.0)
MCH: 30.3 pg (ref 26.0–34.0)
MCHC: 28.4 g/dL — ABNORMAL LOW (ref 30.0–36.0)
MCHC: 28.5 g/dL — ABNORMAL LOW (ref 30.0–36.0)
MCV: 105.9 fL — ABNORMAL HIGH (ref 80.0–100.0)
MCV: 106.3 fL — ABNORMAL HIGH (ref 80.0–100.0)
Platelets: 99 10*3/uL — ABNORMAL LOW (ref 150–400)
Platelets: 107 10*3/uL — ABNORMAL LOW (ref 150–400)
RBC: 2.36 MIL/uL — ABNORMAL LOW (ref 3.87–5.11)
RBC: 2.54 MIL/uL — ABNORMAL LOW (ref 3.87–5.11)
RDW: 15.9 % — ABNORMAL HIGH (ref 11.5–15.5)
RDW: 16 % — ABNORMAL HIGH (ref 11.5–15.5)
WBC: 8.2 10*3/uL (ref 4.0–10.5)
WBC: 8.9 10*3/uL (ref 4.0–10.5)
nRBC: 2.8 % — ABNORMAL HIGH (ref 0.0–0.2)
nRBC: 3.4 % — ABNORMAL HIGH (ref 0.0–0.2)

## 2020-01-11 LAB — POCT I-STAT 7, (LYTES, BLD GAS, ICA,H+H)
Acid-Base Excess: 12 mmol/L — ABNORMAL HIGH (ref 0.0–2.0)
Bicarbonate: 39.6 mmol/L — ABNORMAL HIGH (ref 20.0–28.0)
Calcium, Ion: 1.14 mmol/L — ABNORMAL LOW (ref 1.15–1.40)
HCT: 23 % — ABNORMAL LOW (ref 36.0–46.0)
Hemoglobin: 7.8 g/dL — ABNORMAL LOW (ref 12.0–15.0)
O2 Saturation: 88 %
Patient temperature: 98
Potassium: 5.4 mmol/L — ABNORMAL HIGH (ref 3.5–5.1)
Sodium: 147 mmol/L — ABNORMAL HIGH (ref 135–145)
TCO2: 42 mmol/L — ABNORMAL HIGH (ref 22–32)
pCO2 arterial: 79.1 mmHg (ref 32.0–48.0)
pH, Arterial: 7.306 — ABNORMAL LOW (ref 7.350–7.450)
pO2, Arterial: 62 mmHg — ABNORMAL LOW (ref 83.0–108.0)

## 2020-01-11 LAB — PATHOLOGIST SMEAR REVIEW

## 2020-01-11 LAB — ABO/RH: ABO/RH(D): O POS

## 2020-01-11 MED ORDER — VECURONIUM BROMIDE 10 MG IV SOLR
10.0000 mg | INTRAVENOUS | Status: DC | PRN
  Administered 2020-01-11 – 2020-01-14 (×10): 10 mg via INTRAVENOUS
  Filled 2020-01-11 (×10): qty 10

## 2020-01-11 MED ORDER — CLONAZEPAM 1 MG PO TABS
1.0000 mg | ORAL_TABLET | Freq: Two times a day (BID) | ORAL | Status: DC
  Administered 2020-01-11 – 2020-01-14 (×7): 1 mg
  Filled 2020-01-11 (×7): qty 1

## 2020-01-11 MED ORDER — HYDROMORPHONE BOLUS VIA INFUSION
0.5000 mg | INTRAVENOUS | Status: DC | PRN
  Administered 2020-01-11: 0.5 mg via INTRAVENOUS
  Filled 2020-01-11: qty 1

## 2020-01-11 MED ORDER — SODIUM CHLORIDE 0.9 % IV BOLUS
500.0000 mL | Freq: Once | INTRAVENOUS | Status: AC
  Administered 2020-01-11: 500 mL via INTRAVENOUS

## 2020-01-11 MED ORDER — WITCH HAZEL-GLYCERIN EX PADS
MEDICATED_PAD | CUTANEOUS | Status: DC | PRN
  Filled 2020-01-11: qty 100

## 2020-01-11 MED ORDER — OXYCODONE HCL 5 MG/5ML PO SOLN
5.0000 mg | Freq: Four times a day (QID) | ORAL | Status: DC
  Administered 2020-01-11 – 2020-01-14 (×12): 5 mg
  Filled 2020-01-11 (×12): qty 5

## 2020-01-11 MED ORDER — SODIUM CHLORIDE 0.9 % IV SOLN
0.5000 mg/h | INTRAVENOUS | Status: DC
  Administered 2020-01-11: 1 mg/h via INTRAVENOUS
  Administered 2020-01-12: 2 mg/h via INTRAVENOUS
  Administered 2020-01-13: 1.5 mg/h via INTRAVENOUS
  Filled 2020-01-11 (×5): qty 5

## 2020-01-11 MED ORDER — SODIUM CHLORIDE 0.9 % IV BOLUS
1000.0000 mL | Freq: Once | INTRAVENOUS | Status: AC
  Administered 2020-01-11: 1000 mL via INTRAVENOUS

## 2020-01-11 MED ORDER — HYDROMORPHONE HCL 1 MG/ML IJ SOLN: 1.0000 mg | Freq: Once | INTRAMUSCULAR | Status: AC

## 2020-01-11 NOTE — Progress Notes (Signed)
Notified provider patient's blood pressure has been declining despite decreasing patient's sedation medications, currently 95/45 with MAP 59. Provider ordered 500 mL bolus NS be given. Will continue to monitor.

## 2020-01-11 NOTE — Plan of Care (Signed)
  Problem: Education: Goal: Knowledge of General Education information will improve Description: Including pain rating scale, medication(s)/side effects and non-pharmacologic comfort measures Outcome: Progressing   Problem: Health Behavior/Discharge Planning: Goal: Ability to manage health-related needs will improve Outcome: Progressing   Problem: Clinical Measurements: Goal: Ability to maintain clinical measurements within normal limits will improve Outcome: Progressing Goal: Will remain free from infection Outcome: Progressing Goal: Diagnostic test results will improve Outcome: Progressing Goal: Respiratory complications will improve Outcome: Progressing Goal: Cardiovascular complication will be avoided Outcome: Progressing   Problem: Activity: Goal: Risk for activity intolerance will decrease Outcome: Progressing   Problem: Nutrition: Goal: Adequate nutrition will be maintained Outcome: Progressing   Problem: Coping: Goal: Level of anxiety will decrease Outcome: Progressing   Problem: Coping: Goal: Psychosocial and spiritual needs will be supported Outcome: Progressing   Problem: Respiratory: Goal: Will maintain a patent airway Outcome: Progressing Goal: Complications related to the disease process, condition or treatment will be avoided or minimized Outcome: Progressing   Problem: Education: Goal: Knowledge of disease or condition will improve Outcome: Progressing Goal: Understanding of medication regimen will improve Outcome: Progressing Goal: Individualized Educational Video(s) Outcome: Progressing   Problem: Activity: Goal: Ability to tolerate increased activity will improve Outcome: Progressing   Problem: Cardiac: Goal: Ability to achieve and maintain adequate cardiopulmonary perfusion will improve Outcome: Progressing   Problem: Health Behavior/Discharge Planning: Goal: Ability to safely manage health-related needs after discharge will  improve Outcome: Progressing   Problem: Activity: Goal: Ability to tolerate increased activity will improve Outcome: Progressing   Problem: Respiratory: Goal: Ability to maintain a clear airway and adequate ventilation will improve Outcome: Progressing   Problem: Role Relationship: Goal: Method of communication will improve Outcome: Progressing   Problem: Activity: Goal: Ability to tolerate increased activity will improve Outcome: Progressing   Problem: Respiratory: Goal: Ability to maintain a clear airway and adequate ventilation will improve Outcome: Progressing   Problem: Role Relationship: Goal: Method of communication will improve Outcome: Progressing

## 2020-01-11 NOTE — Progress Notes (Signed)
Pt proned at this time after taping ETT with cloth tape.  3xRN, 1x tech and 2x RT present. No complications to note.  Rt will continue to monitor.

## 2020-01-11 NOTE — Progress Notes (Signed)
Attending:     Subjective: No acute events overnight Prone again around 0200 Afebrile HR, BP OK Bowel movement yesterday Na up slightly Renal function stable, AST/ALT up slightly Hgb down slightly  Objective: Vitals:   01/11/20 0800 01/11/20 0900 01/11/20 0915 01/11/20 0925  BP: (!) 116/56 (!) 104/52 (!) 95/52 (!) 100/53  Pulse: 72 80 81 78  Resp: (!) 34 (!) 34 (!) 34 (!) 34  Temp:      TempSrc:      SpO2: 96% 97% 98% 98%  Weight:      Height:       Vent Mode: PRVC FiO2 (%):  [80 %] 80 % Set Rate:  [34 bmp] 34 bmp Vt Set:  [410 mL] 410 mL PEEP:  [12 cmH20] 12 cmH20 Plateau Pressure:  [30 cmH20-35 cmH20] 35 cmH20  Intake/Output Summary (Last 24 hours) at 01/11/2020 0951 Last data filed at 01/11/2020 0800 Gross per 24 hour  Intake 2796.78 ml  Output 1350 ml  Net 1446.78 ml    General:  In bed on vent, prone position HENT: NCAT ETT in place PULM: Coarse crackles bilaterally B, vent supported breathing CV: difficult to examine due to prone position GI: BS+, soft, nontender MSK: normal bulk and tone Neuro: sedated on vent    CBC    Component Value Date/Time   WBC 8.2 01/11/2020 0747   RBC 2.36 (L) 01/11/2020 0747   HGB 7.1 (L) 01/11/2020 0747   HCT 25.0 (L) 01/11/2020 0747   PLT 99 (L) 01/11/2020 0747   MCV 105.9 (H) 01/11/2020 0747   MCH 30.1 01/11/2020 0747   MCHC 28.4 (L) 01/11/2020 0747   RDW 15.9 (H) 01/11/2020 0747   LYMPHSABS 1.2 01/10/2020 0557   MONOABS 0.4 01/10/2020 0557   EOSABS 0.1 01/10/2020 0557   BASOSABS 0.0 01/10/2020 0557    BMET    Component Value Date/Time   NA 147 (H) 01/11/2020 0414   K 5.1 01/11/2020 0414   CL 105 01/11/2020 0414   CO2 36 (H) 01/11/2020 0414   GLUCOSE 133 (H) 01/11/2020 0414   BUN 37 (H) 01/11/2020 0414   CREATININE 0.44 01/11/2020 0414   CALCIUM 7.6 (L) 01/11/2020 0414   GFRNONAA >60 01/11/2020 0414   GFRAA >60 01/11/2020 0414    CXR images 10/4 personally reviewed> RML infiltrate new, bilateral  interstitial opacification  Impression/Plan: ARDS due to COVID 19 pneumonia> continue prone positioning, ARDS protocol, stop steroids New infiltrate on CXR> continue unasyn for now, check with  Hyperglycemia> lantus to continue, insulin infusion to continue, stop steroids Poor dentition> consider dental consult Need for sedation for mechanical ventilation> change fentanyl to dilaudid, change nimbex infusion to prn vecuronium, add oral clonazepam and oxycodone  My cc time 35 minutes  Heber Bajadero, MD [facility] Pager: [phone] Cell: [phone] After 3pm or if no response, call [phone]

## 2020-01-11 NOTE — Progress Notes (Signed)
Pt returned to the supine position with RT x2, NT x1 and RN x3. Breakdown to the cheeks noted and swelling to bilateral eyes. Dr. Kendrick Fries notified and will come to see pt. Pt tolerated well. RT will continue to monitor.

## 2020-01-11 NOTE — Progress Notes (Addendum)
NAME:  Linda Richmond, MRN:  [id number], DOB:  [birth year], LOS: 32 ADMISSION DATE:  12/17/2019, CONSULTATION DATE:  12/28/2019 REFERRING MD:  Dr. Jerral Ralph, CHIEF COMPLAINT:  Hypoxic respiratory failure 2/2 COVID-19  Brief History   54yo female with T2DM, HTN, seronegative RA, anxiety, and depression who presented to [facility] 9/3 with 1 week history of body aches, headache, nasal congestion, and shortness of breath. In ED, saturations in mid-80's on room air, CXR with multifocal opacities, COVID-19 positive. Of note, patient is unvaccinated. Subsequently admitted to [facility] then trasferred to [facility] due to increasing oxygen requirements. Patient's hospital course complicated with Afib with RVR, worsening respiratory failure. Unable to tolerate HHFNC beyond 50L and NRB with saturations in 70's-80's. PCCM consulted for ICU transfer  Past Medical History  T2DM HTN Seronegative RA Anxiety Depression  Significant Hospital Events   9/3 Admitted to [facility] 9/6 Transferred to [facility] given increased oxygen requirements 9/8 Afib with RVR 9/19 Leukocytosis worsening, infiltrates persistent > cefepime addede 9/21 HHFNC and NRB required, PCCM consulted 9/24 Tolerated day off BiPAP and night on BiPAP. Tolerated clear liquids. Failed cortrax with desat, epistaxis 9/25 Intubated  Consults:  Cardiology PCCM  Procedures:  9/16 Single-lumen PICC placement 9/26 Intubation  Significant Diagnostic Tests:  9/3 CXR, bilateral mid-lower lung opacities 9/9 TTE: EF 60-65% 9/16 CXR, airspace opacity throughout mid/lower lungs 9/16 :CXR, interval development of pneumomediastinum 9/17 CXR pneumomediatinum, tiny left apical pneumothorax 9/18 CXR stable, extensive patchy hazy opacities, no pneumonthorax 9/19 CXR persistent airspace opacities over mid-lower lungs bilaterally 9/20 CXR persistent streaky infiltrates to bilateral lungs 9/24 CXR > stable low lung volumes, some interval improvement to bilateral opacities 9/30 CXR with  worsening diffuse bilateral infiltrates 10/3 CXR improved aeration, persistent hazy infiltrates bilaterally 10/4 CXR worsening diffuse bilateral infiltrates  Micro Data:  9/3 COVID SARS2 > positive 9/3 BCx 2 > staph epi 1/2 9/13 MRSA PCR > negative  Antimicrobials:  Unasyn 10/3-  Cefepime 9/19-9/23 Baricitinib 9/5-9/18 Decadron 9/3-9/4 Solumedrol 9/5-9/15, 9/20- Remdesivir 9/3-9/7  Interim history/subjective:  No acute events overnight.   Objective   Blood pressure (!) 116/56, pulse 72, temperature 98.1 F (36.7 C), temperature source Oral, resp. rate (!) 34, height 5\' 2"  (1.575 m), weight 118 kg, last menstrual period 04/18/2018, SpO2 96 %.    Vent Mode: PRVC FiO2 (%):  [80 %] 80 % Set Rate:  [34 bmp] 34 bmp Vt Set:  [410 mL] 410 mL PEEP:  [12 cmH20] 12 cmH20 Plateau Pressure:  [30 cmH20-35 cmH20] 35 cmH20   Intake/Output Summary (Last 24 hours) at 01/11/2020 0740 Last data filed at 01/11/2020 0700 Gross per 24 hour  Intake 3040.63 ml  Output 1425 ml  Net 1615.63 ml   Filed Weights   01/09/20 0415 01/10/20 0500 01/11/20 0328  Weight: 114.9 kg 115.7 kg 118 kg    Examination: General: Intubated, proned HENT: Normocephalic, atraumatic Pulm: Bilateral rales present Cardiovascular: Regular rate, rhythm. No m/r/g Abdomen: Unable to assess due to proned position  Extremities: Warm, dry Neuro: Sedated  Resolved Hospital Problem list   Acute Kidney Injury  Assessment & Plan:  [name] is 54yo female with history of diabetes, hypertension, anxiety, depression presented 9/3 with COVID-19 hypoxic respiratory failure, complicated by Afib w/ RVR, currently mechanically ventilated with hypercapnic respiratory acidosis.  #Acute hypoxic respiratory failure d/t ARDS 2/2 COVID-19 PNA #Hx OSA, non-compliant w/ CPAP -ARDSnet ventilation strategy, including proning -Latest ABG 7.31 / 83 / 42 -CXR w/ likely RML infiltrate, bilateral opacification -D/c salumedrol -Afebrile,  no leukocytosis present. Patient already on unasyn, will hold off further abx  #Hyperglycemia on steroids #Hx of T2DM -Insulin gtt, Lantus 20 BID, linagliptin 5mg  qd -D/c steroids -CBG q4  #Paroxysmal Afib -C/w enteral Lopressor, prophylactic dose Lovenox -Tele  #Hypernatremia 147 < 145 -C/w free water q6 -Monitor CMP  #Anxiety #Sleep disturbance -Switch from Fentanyl to Dilaudid -Intermittent Nimbex (from continuous) -po oxycodone, clonazepam -Okay for RASS score between 0, -3 -TOF while paralyzed  #Dislodged teeth -S/p dislodged teeth on 10/2 -C/w unasyn (day 4)  #Macrocytic Anemia Hgb 7.1 < 8.4 -R/p CBC today -Transfuse Hgb <7  #Transaminitis AST/ALT 52/118 (47/97 prior) -Continue monitor CMP  #Hypothyroidism -C/w armour daily  #Vitamin D supplementation -Weekly 50,000U given 10/2  Best practice:  Diet: NPO, TF Pain/Anxiety/Delirium protocol (if indicated): PAD protocol VAP protocol (if indicated): n/a DVT prophylaxis: Lovenox GI prophylaxis: Famotidine Glucose control: Insulin gtt, Lantus, linagliptin, SSI Mobility: bed rest Code Status: Full 12/2 9/25 with son, daughter. Son asked about prognosis, was explained high risk for mortality in ICU and if survives likely looking at LTAC/Trach and attendant chronic critical illness. He expressed patient was never a fan of DNR, wants everything done and fighting to the end.) Family Communication: Discussed with uncle in the room yesterday. Will call family today. Disposition:   Labs   CBC: Recent Labs  Lab 01/06/20 0320 01/06/20 0519 01/07/20 0421 01/07/20 1635 01/08/20 0315 01/08/20 0900 01/09/20 0409 01/09/20 1632 01/10/20 0040 01/10/20 0557 01/10/20 1930  WBC 5.7  --  7.6  --  7.7  --  8.3  --   --  9.1  --   NEUTROABS 4.5  --  5.3  --  5.4  --  6.3  --   --  6.8  --   HGB 9.3*   < > 10.2*   < > 9.3*   < > 7.6* 9.9* 13.3 8.4* 9.5*  HCT 32.3*   < > 34.9*   < > 32.4*   < > 26.5*  29.0* 39.0 28.4* 28.0*  MCV 102.9*  --  102.6*  --  103.2*  --  104.7*  --   --  105.2*  --   PLT 85*  --  110*  --  103*  --  86*  --   --  100*  --    < > = values in this interval not displayed.    Basic Metabolic Panel: Recent Labs  Lab 01/05/20 0215 01/05/20 0509 01/06/20 0320 01/06/20 0519 01/07/20 0421 01/07/20 1635 01/08/20 0315 01/08/20 0900 01/09/20 0320 01/09/20 1632 01/10/20 0040 01/10/20 1930 01/11/20 0414  NA 152*   < > 147*   < > 145   < > 145   < > 144 144 145 147* 147*  K 4.7   < > 5.8*   < > 5.2*   < > 4.9   < > 5.5* 5.6* 5.2* 5.6* 5.1  CL 114*  --  109  --  107  --  106  --   --   --   --   --  105  CO2 30  --  34*  --  33*  --  33*  --   --   --   --   --  36*  GLUCOSE 292*  --  150*  --  167*  --  176*  --   --   --   --   --  133*  BUN 71*  --  60*  --  47*  --  41*  --   --   --   --   --  37*  CREATININE 0.92  --  0.75  --  0.59  --  0.55  --   --   --   --   --  0.44  CALCIUM 8.7*  --  8.2*  --  8.3*  --  7.7*  --   --   --   --   --  7.6*  MG  --   --   --   --   --   --  2.1  --   --   --   --   --   --    < > = values in this interval not displayed.   GFR: Estimated Creatinine Clearance: 95.8 mL/min (by C-G formula based on SCr of 0.44 mg/dL). Recent Labs  Lab 01/07/20 0421 01/08/20 0315 01/08/20 1249 01/09/20 0409 01/10/20 0557  PROCALCITON  --   --  0.28  --   --   WBC 7.6 7.7  --  8.3 9.1    Liver Function Tests: Recent Labs  Lab 01/06/20 0320 01/07/20 0421 01/07/20 1722 01/08/20 0315 01/11/20 0414  AST 66* 53* 71* 52* 47*  ALT 131* 123* 135* 118* 97*  ALKPHOS 94 88 97 82 88  BILITOT 0.4 0.5 0.6 0.5 0.5  PROT 4.4* 4.6* 4.4* 4.1* 4.5*  ALBUMIN 1.6* 1.6* 1.6* 1.4* 1.5*   No results for input(s): LIPASE, AMYLASE in the last 168 hours. No results for input(s): AMMONIA in the last 168 hours.  ABG    Component Value Date/Time   PHART 7.317 (L) 01/10/2020 1930   PCO2ART 83.1 (HH) 01/10/2020 1930   PO2ART 52 (L) 01/10/2020  1930   HCO3 42.5 (H) 01/10/2020 1930   TCO2 45 (H) 01/10/2020 1930   O2SAT 81.0 01/10/2020 1930      Coagulation Profile: No results for input(s): INR, PROTIME in the last 168 hours.  Cardiac Enzymes: No results for input(s): CKTOTAL, CKMB, CKMBINDEX, TROPONINI in the last 168 hours.  HbA1C: Hgb A1c MFr Bld  Date/Time Value Ref Range Status  12/27/2019 03:44 PM 10.8 (H) 4.8 - 5.6 % Final    Comment:    (NOTE) Pre diabetes:          5.7%-6.4%  Diabetes:              >6.4%  Glycemic control for   <7.0% adults with diabetes     CBG: Recent Labs  Lab 01/11/20 0208 01/11/20 0409 01/11/20 0511 01/11/20 0624 01/11/20 0656  GLUCAP 165* 139* 131* 130* 153*    Review of Systems:   Unable to assess given sedated, intubated.  Past Medical History  She,  has a past medical history of Anxiety, Depression, Diabetes mellitus without complication (HCC), GERD (gastroesophageal reflux disease), Hypertension, Hypothyroidism, Palpitations, and Rheumatoid arthritis (HCC).   Surgical History    Past Surgical History:  Procedure Laterality Date  . BLADDER SURGERY     X's 3 for interstitial cystitis  . CHOLECYSTECTOMY       Social History   reports that she has never smoked. She has never used smokeless tobacco. She reports that she does not drink alcohol and does not use drugs.   Family History   Her family history includes High blood pressure in her mother; Pancreatic cancer in her father.   Allergies Allergies  Allergen Reactions  . Ancef [Cefazolin] Hives  .  Aspirin Other (See Comments)    GI Bleed  . Doxycycline Diarrhea and Nausea And Vomiting  . Sulfa Antibiotics Hives    Possible loss of consciousness  . Ciprofloxacin Rash    "feels like bugs are crawling in my head"  . Lesinurad-Allopurinol Nausea And Vomiting  . Penicillins Rash     Home Medications  Prior to Admission medications   Medication Sig Start Date End Date Taking? Authorizing Provider    ALPRAZolam Prudy Feeler) 0.5 MG tablet Take 0.25-0.5 mg by mouth daily as needed for anxiety.    Yes [provider]  busPIRone (BUSPAR) 15 MG tablet Take 1 tablet by mouth in the morning, at noon, and at bedtime. 07/19/19  Yes [provider]  escitalopram (LEXAPRO) 5 MG tablet Take 5 mg by mouth daily. 07/19/19  Yes [provider]  levothyroxine (SYNTHROID) 25 MCG tablet Take 25 mcg by mouth daily. 10/04/19  Yes [provider]  olmesartan (BENICAR) 20 MG tablet Take 20 mg by mouth 2 (two) times daily.   Yes [provider]  pantoprazole (PROTONIX) 40 MG tablet Take 40 mg by mouth daily as needed (for GERD/Avid reflux).    Yes [provider]  Vitamin D, Ergocalciferol, (DRISDOL) 50000 units CAPS capsule  07/17/16  Yes [provider]  apixaban (ELIQUIS) 5 MG TABS tablet Take 1 tablet (5 mg total) by mouth 2 (two) times daily. 12/23/19   Ghimire, Werner Lean, MD  furosemide (LASIX) 20 MG tablet Take 1 tablet (20 mg total) by mouth daily as needed for edema (swelling). 08/23/16 11/21/16  Antoine Poche, MD  thyroid (ARMOUR) 30 MG tablet Take 30 mg by mouth daily before breakfast. Patient not taking: Reported on 12/11/2019    [provider]     Critical care time: 20 minutes

## 2020-01-12 ENCOUNTER — Inpatient Hospital Stay (HOSPITAL_COMMUNITY): Payer: 59

## 2020-01-12 DIAGNOSIS — J80 Acute respiratory distress syndrome: Secondary | ICD-10-CM | POA: Diagnosis not present

## 2020-01-12 DIAGNOSIS — J9620 Acute and chronic respiratory failure, unspecified whether with hypoxia or hypercapnia: Secondary | ICD-10-CM | POA: Diagnosis not present

## 2020-01-12 LAB — POCT I-STAT 7, (LYTES, BLD GAS, ICA,H+H)
Acid-Base Excess: 11 mmol/L — ABNORMAL HIGH (ref 0.0–2.0)
Bicarbonate: 39.2 mmol/L — ABNORMAL HIGH (ref 20.0–28.0)
Calcium, Ion: 1.12 mmol/L — ABNORMAL LOW (ref 1.15–1.40)
HCT: 25 % — ABNORMAL LOW (ref 36.0–46.0)
Hemoglobin: 8.5 g/dL — ABNORMAL LOW (ref 12.0–15.0)
O2 Saturation: 84 %
Patient temperature: 98.1
Potassium: 4.3 mmol/L (ref 3.5–5.1)
Sodium: 147 mmol/L — ABNORMAL HIGH (ref 135–145)
TCO2: 42 mmol/L — ABNORMAL HIGH (ref 22–32)
pCO2 arterial: 79.2 mmHg (ref 32.0–48.0)
pH, Arterial: 7.301 — ABNORMAL LOW (ref 7.350–7.450)
pO2, Arterial: 56 mmHg — ABNORMAL LOW (ref 83.0–108.0)

## 2020-01-12 LAB — COMPREHENSIVE METABOLIC PANEL
ALT: 114 U/L — ABNORMAL HIGH (ref 0–44)
AST: 71 U/L — ABNORMAL HIGH (ref 15–41)
Albumin: 1.5 g/dL — ABNORMAL LOW (ref 3.5–5.0)
Alkaline Phosphatase: 96 U/L (ref 38–126)
Anion gap: 4 — ABNORMAL LOW (ref 5–15)
BUN: 35 mg/dL — ABNORMAL HIGH (ref 6–20)
CO2: 36 mmol/L — ABNORMAL HIGH (ref 22–32)
Calcium: 7.4 mg/dL — ABNORMAL LOW (ref 8.9–10.3)
Chloride: 107 mmol/L (ref 98–111)
Creatinine, Ser: 0.49 mg/dL (ref 0.44–1.00)
GFR calc non Af Amer: >60 mL/min (ref >60)
Glucose, Bld: 142 mg/dL — ABNORMAL HIGH (ref 70–99)
Potassium: 5.1 mmol/L (ref 3.5–5.1)
Sodium: 147 mmol/L — ABNORMAL HIGH (ref 135–145)
Total Bilirubin: 0.6 mg/dL (ref 0.3–1.2)
Total Protein: 4.4 g/dL — ABNORMAL LOW (ref 6.5–8.1)

## 2020-01-12 LAB — GLUCOSE, CAPILLARY
Glucose-Capillary: 116 mg/dL — ABNORMAL HIGH (ref 70–99)
Glucose-Capillary: 119 mg/dL — ABNORMAL HIGH (ref 70–99)
Glucose-Capillary: 121 mg/dL — ABNORMAL HIGH (ref 70–99)
Glucose-Capillary: 121 mg/dL — ABNORMAL HIGH (ref 70–99)
Glucose-Capillary: 125 mg/dL — ABNORMAL HIGH (ref 70–99)
Glucose-Capillary: 138 mg/dL — ABNORMAL HIGH (ref 70–99)
Glucose-Capillary: 140 mg/dL — ABNORMAL HIGH (ref 70–99)
Glucose-Capillary: 143 mg/dL — ABNORMAL HIGH (ref 70–99)
Glucose-Capillary: 146 mg/dL — ABNORMAL HIGH (ref 70–99)
Glucose-Capillary: 150 mg/dL — ABNORMAL HIGH (ref 70–99)
Glucose-Capillary: 151 mg/dL — ABNORMAL HIGH (ref 70–99)
Glucose-Capillary: 152 mg/dL — ABNORMAL HIGH (ref 70–99)

## 2020-01-12 LAB — CBC
HCT: 24.3 % — ABNORMAL LOW (ref 36.0–46.0)
HCT: 24 % — ABNORMAL LOW (ref 36.0–46.0)
Hemoglobin: 7 g/dL — ABNORMAL LOW (ref 12.0–15.0)
Hemoglobin: 6.8 g/dL — CL (ref 12.0–15.0)
MCH: 30.6 pg (ref 26.0–34.0)
MCH: 30 pg (ref 26.0–34.0)
MCHC: 28.8 g/dL — ABNORMAL LOW (ref 30.0–36.0)
MCHC: 28.3 g/dL — ABNORMAL LOW (ref 30.0–36.0)
MCV: 106.1 fL — ABNORMAL HIGH (ref 80.0–100.0)
MCV: 105.7 fL — ABNORMAL HIGH (ref 80.0–100.0)
Platelets: 105 10*3/uL — ABNORMAL LOW (ref 150–400)
Platelets: 102 10*3/uL — ABNORMAL LOW (ref 150–400)
RBC: 2.29 MIL/uL — ABNORMAL LOW (ref 3.87–5.11)
RBC: 2.27 MIL/uL — ABNORMAL LOW (ref 3.87–5.11)
RDW: 16.3 % — ABNORMAL HIGH (ref 11.5–15.5)
RDW: 16.4 % — ABNORMAL HIGH (ref 11.5–15.5)
WBC: 8.3 10*3/uL (ref 4.0–10.5)
WBC: 6.7 10*3/uL (ref 4.0–10.5)
nRBC: 4.7 % — ABNORMAL HIGH (ref 0.0–0.2)
nRBC: 5.5 % — ABNORMAL HIGH (ref 0.0–0.2)

## 2020-01-12 LAB — PREPARE RBC (CROSSMATCH)

## 2020-01-12 MED ORDER — FREE WATER: 200.0000 mL | Freq: Four times a day (QID) | Status: DC

## 2020-01-12 MED ORDER — INSULIN ASPART 100 UNIT/ML ~~LOC~~ SOLN
10.0000 [IU] | SUBCUTANEOUS | Status: DC
  Administered 2020-01-12 – 2020-01-14 (×11): 10 [IU] via SUBCUTANEOUS

## 2020-01-12 MED ORDER — SODIUM CHLORIDE 0.9% IV SOLUTION: Freq: Once | INTRAVENOUS | Status: DC

## 2020-01-12 MED ORDER — INSULIN ASPART 100 UNIT/ML ~~LOC~~ SOLN
0.0000 [IU] | SUBCUTANEOUS | Status: DC
  Administered 2020-01-12 – 2020-01-13 (×6): 3 [IU] via SUBCUTANEOUS
  Administered 2020-01-14: 4 [IU] via SUBCUTANEOUS
  Administered 2020-01-14: 3 [IU] via SUBCUTANEOUS
  Administered 2020-01-14: 4 [IU] via SUBCUTANEOUS

## 2020-01-12 MED ORDER — FREE WATER: 200.0000 mL | Status: DC

## 2020-01-12 MED ORDER — FUROSEMIDE 10 MG/ML IJ SOLN
20.0000 mg | Freq: Four times a day (QID) | INTRAMUSCULAR | Status: AC
  Administered 2020-01-12 (×2): 20 mg via INTRAVENOUS
  Filled 2020-01-12 (×2): qty 2

## 2020-01-12 MED ORDER — INSULIN GLARGINE 100 UNIT/ML ~~LOC~~ SOLN
30.0000 [IU] | Freq: Two times a day (BID) | SUBCUTANEOUS | Status: DC
  Administered 2020-01-12 – 2020-01-14 (×5): 30 [IU] via SUBCUTANEOUS
  Filled 2020-01-12 (×7): qty 0.3

## 2020-01-12 MED ORDER — FREE WATER
200.0000 mL | Status: DC
  Administered 2020-01-12 – 2020-01-13 (×6): 200 mL

## 2020-01-12 NOTE — Progress Notes (Signed)
Patients ETT tape and facial barrier removed.   Replaced with new tape and barrier to prevent breakdown.  ETT is now 23@lip  on left side.  Patient proned at 0220 with her head resting on her right side.  Proned with 2 RT's and 3RN's  During exercise.  RT will continue to monitor.

## 2020-01-12 NOTE — Progress Notes (Addendum)
NAME:  Linda Richmond, MRN:  [id number], DOB:  July 18, 1965, LOS: 33 ADMISSION DATE:  12-18-19, CONSULTATION DATE:  12/28/2019 REFERRING MD:  Dr. Jerral Ralph, CHIEF COMPLAINT:  Hypoxic respiratory failure 2/2 COVID-19  Brief History   54yo female with T2DM, HTN, seronegative RA, anxiety, and depression who presented to [facility] 9/3 with 1 week history of body aches, headache, nasal congestion, and shortness of breath. In ED, saturations in mid-80's on room air, CXR with multifocal opacities, COVID-19 positive. Of note, patient is unvaccinated. Subsequently admitted to [facility] then trasferred to [facility] due to increasing oxygen requirements. Patient's hospital course complicated with Afib with RVR, worsening respiratory failure. Unable to tolerate HHFNC beyond 50L and NRB with saturations in 70's-80's. PCCM consulted for ICU transfer  Past Medical History  T2DM HTN Seronegative RA Anxiety Depression  Significant Hospital Events   9/3 Admitted to [facility] 9/6 Transferred to [facility] given increased oxygen requirements 9/8 Afib with RVR 9/19 Leukocytosis worsening, infiltrates persistent > cefepime addede 9/21 HHFNC and NRB required, PCCM consulted 9/24 Tolerated day off BiPAP and night on BiPAP. Tolerated clear liquids. Failed cortrax with desat, epistaxis 9/25 Intubated  Consults:  Cardiology PCCM  Procedures:  9/16 Single-lumen PICC placement 9/26 Intubation  Significant Diagnostic Tests:  9/3 CXR, bilateral mid-lower lung opacities 9/9 TTE: EF 60-65% 9/16 CXR, airspace opacity throughout mid/lower lungs 9/16 :CXR, interval development of pneumomediastinum 9/17 CXR pneumomediatinum, tiny left apical pneumothorax 9/18 CXR stable, extensive patchy hazy opacities, no pneumonthorax 9/19 CXR persistent airspace opacities over mid-lower lungs bilaterally 9/20 CXR persistent streaky infiltrates to bilateral lungs 9/24 CXR > stable low lung volumes, some interval improvement to bilateral opacities 9/30 CXR with  worsening diffuse bilateral infiltrates 10/3 CXR improved aeration, persistent hazy infiltrates bilaterally 10/4 CXR worsening diffuse bilateral infiltrates  Micro Data:  9/3 COVID SARS2 > positive 9/3 BCx 2 > staph epi 1/2 9/13 MRSA PCR > negative  Antimicrobials:  Unasyn 10/3-  Cefepime 9/19-9/23 Baricitinib 9/5-9/18 Decadron 9/3-9/4 Solumedrol 9/5-9/15, 9/20- Remdesivir 9/3-9/7  Interim history/subjective:  No acute events overnight.   Objective   Blood pressure (!) 106/51, pulse 72, temperature 98.5 F (36.9 C), temperature source Oral, resp. rate (!) 34, height 5\' 2"  (1.575 m), weight 123.5 kg, last menstrual period 04/18/2018, SpO2 97 %.    Vent Mode: PRVC FiO2 (%):  [80 %] 80 % Set Rate:  [34 bmp] 34 bmp Vt Set:  [410 mL] 410 mL PEEP:  [12 cmH20] 12 cmH20 Plateau Pressure:  [28 cmH20-35 cmH20] 29 cmH20   Intake/Output Summary (Last 24 hours) at 01/12/2020 0729 Last data filed at 01/12/2020 0600 Gross per 24 hour  Intake 3944.87 ml  Output 1875 ml  Net 2069.87 ml   Filed Weights   01/10/20 0500 01/11/20 0328 01/12/20 0432  Weight: 115.7 kg 118 kg 123.5 kg    Examination: General: Intubated, proned HENT: Normocephalic, atraumatic Pulm: Bilateral rales present. Cardiovascular: Regular rate, rhythm. No m/r/g Abdomen: Unable to assess due to proned position  Extremities: Warm, dry, no edema bilaterally Neuro: Sedated  Resolved Hospital Problem list   Acute Kidney Injury  Assessment & Plan:  Linda Richmond is 54yo female with history of diabetes, hypertension, anxiety, depression presented 9/3 with COVID-19 hypoxic respiratory failure, complicated by Afib w/ RVR, currently mechanically ventilated with hypercapnic respiratory acidosis.  #Acute hypoxic respiratory failure d/t ARDS 2/2 COVID-19 PNA #Hx OSA, non-compliant w/ CPAP -ARDSnet ventilation strategy, including proning -Latest ABG 7.31 / 79 / 39 -CXR continued bilateral opacities -Afebrile, no  leukocytosis present. Day 5 unasyn  #Hyperglycemia on steroids #Hx of T2DM -CBGs appropriate s/p cessation of steroids -Wean off insulin gtt today -C/w Lantus 20 BID, linagliptin 5mg  qd -CBG q4  #Paroxysmal Afib -C/w enteral Lopressor, prophylactic dose Lovenox -Tele  #Hypernatremia 147 < 147 -C/w free water q6 -Monitor CMP  #Anxiety #Sleep disturbance -Switch from Fentanyl to Dilaudid -Intermittent Norcuron (from continuous) -po oxycodone, clonazepam -Okay for RASS score between 0, -3 -TOF while paralyzed  #Dislodged teeth -S/p dislodged teeth on 10/2 -C/w unasyn (day 5)  #Macrocytic Anemia Hgb 7.0 < 7.7 -R/p CBC today -Transfuse Hgb <7  #Transaminitis AST/ALT 71/114 (52/118 prior) -Continue monitor CMP  #Hypothyroidism -C/w armour daily  #Vitamin D supplementation -Weekly 50,000U given 10/2  #Goals of care -Long hospitalization for patient, unlikely meaningful recovery given high vent settings and extended period of time under mechanical ventilation under critical care management. Continued discussions with family regarding goals.  -Can consider palliative consult for further assistance in code status, long term goals  Best practice:  Diet: NPO, TF Pain/Anxiety/Delirium protocol (if indicated): PAD protocol VAP protocol (if indicated): n/a DVT prophylaxis: Lovenox GI prophylaxis: Famotidine Glucose control: Insulin gtt, Lantus, linagliptin, SSI Mobility: bed rest Code Status: Full [name] 9/25 with son, daughter. Son asked about prognosis, was explained high risk for mortality in ICU and if survives likely looking at LTAC/Trach and attendant chronic critical illness. He expressed patient was never a fan of DNR, wants everything done and fighting to the end.) Family Communication: Called son yesterday, will update again today, especially regarding GOC Disposition:   Labs   CBC: Recent Labs  Lab 01/06/20 0320 01/06/20 0519 01/07/20 0421  01/07/20 1635 01/08/20 0315 01/08/20 0900 01/09/20 0409 01/09/20 1632 01/10/20 0557 01/10/20 0557 01/10/20 1930 01/11/20 0747 01/11/20 1156 01/11/20 2037 01/12/20 0451  WBC 5.7   < > 7.6  --  7.7  --  8.3  --  9.1  --   --  8.2 8.9  --  8.3  NEUTROABS 4.5  --  5.3  --  5.4  --  6.3  --  6.8  --   --   --   --   --   --   HGB 9.3*   < > 10.2*   < > 9.3*   < > 7.6*   < > 8.4*   < > 9.5* 7.1* 7.7* 7.8* 7.0*  HCT 32.3*   < > 34.9*   < > 32.4*   < > 26.5*   < > 28.4*   < > 28.0* 25.0* 27.0* 23.0* 24.3*  MCV 102.9*   < > 102.6*  --  103.2*  --  104.7*  --  105.2*  --   --  105.9* 106.3*  --  106.1*  PLT 85*   < > 110*  --  103*  --  86*  --  100*  --   --  99* 107*  --  105*   < > = values in this interval not displayed.    Basic Metabolic Panel: Recent Labs  Lab 01/06/20 0320 01/06/20 0519 01/07/20 0421 01/07/20 1635 01/08/20 0315 01/08/20 0900 01/10/20 0040 01/10/20 1930 01/11/20 0414 01/11/20 2037 01/12/20 0451  NA 147*   < > 145   < > 145   < > 145 147* 147* 147* 147*  K 5.8*   < > 5.2*   < > 4.9   < > 5.2* 5.6* 5.1 5.4* 5.1  CL 109  --  107  --  106  --   --   --  105  --  107  CO2 34*  --  33*  --  33*  --   --   --  36*  --  36*  GLUCOSE 150*  --  167*  --  176*  --   --   --  133*  --  142*  BUN 60*  --  47*  --  41*  --   --   --  37*  --  35*  CREATININE 0.75  --  0.59  --  0.55  --   --   --  0.44  --  0.49  CALCIUM 8.2*  --  8.3*  --  7.7*  --   --   --  7.6*  --  7.4*  MG  --   --   --   --  2.1  --   --   --   --   --   --    < > = values in this interval not displayed.   GFR: Estimated Creatinine Clearance: 98.5 mL/min (by C-G formula based on SCr of 0.49 mg/dL). Recent Labs  Lab 01/08/20 1249 01/09/20 0409 01/10/20 0557 01/11/20 0747 01/11/20 1156 01/12/20 0451  PROCALCITON 0.28  --   --   --   --   --   WBC  --    < > 9.1 8.2 8.9 8.3   < > = values in this interval not displayed.    Liver Function Tests: Recent Labs  Lab 01/07/20 0421  01/07/20 1722 01/08/20 0315 01/11/20 0414 01/12/20 0451  AST 53* 71* 52* 47* 71*  ALT 123* 135* 118* 97* 114*  ALKPHOS 88 97 82 88 96  BILITOT 0.5 0.6 0.5 0.5 0.6  PROT 4.6* 4.4* 4.1* 4.5* 4.4*  ALBUMIN 1.6* 1.6* 1.4* 1.5* 1.5*   No results for input(s): LIPASE, AMYLASE in the last 168 hours. No results for input(s): AMMONIA in the last 168 hours.  ABG    Component Value Date/Time   PHART 7.306 (L) 01/11/2020 2037   PCO2ART 79.1 (HH) 01/11/2020 2037   PO2ART 62 (L) 01/11/2020 2037   HCO3 39.6 (H) 01/11/2020 2037   TCO2 42 (H) 01/11/2020 2037   O2SAT 88.0 01/11/2020 2037      Coagulation Profile: No results for input(s): INR, PROTIME in the last 168 hours.  Cardiac Enzymes: No results for input(s): CKTOTAL, CKMB, CKMBINDEX, TROPONINI in the last 168 hours.  HbA1C: Hgb A1c MFr Bld  Date/Time Value Ref Range Status  12/09/2019 03:44 PM 10.8 (H) 4.8 - 5.6 % Final    Comment:    (NOTE) Pre diabetes:          5.7%-6.4%  Diabetes:              >6.4%  Glycemic control for   <7.0% adults with diabetes     CBG: Recent Labs  Lab 01/12/20 0009 01/12/20 0234 01/12/20 0427 01/12/20 0527 01/12/20 0648  GLUCAP 143* 125* 121* 116* 140*    Review of Systems:   Unable to assess given sedated, intubated.  Past Medical History  She,  has a past medical history of Anxiety, Depression, Diabetes mellitus without complication (HCC), GERD (gastroesophageal reflux disease), Hypertension, Hypothyroidism, Palpitations, and Rheumatoid arthritis (HCC).   Surgical History    Past Surgical History:  Procedure Laterality Date  . BLADDER SURGERY     X's 3 for interstitial cystitis  .  CHOLECYSTECTOMY       Social History   reports that she has never smoked. She has never used smokeless tobacco. She reports that she does not drink alcohol and does not use drugs.   Family History   Her family history includes High blood pressure in her mother; Pancreatic cancer in her father.     Allergies Allergies  Allergen Reactions  . Ancef [Cefazolin] Hives  . Aspirin Other (See Comments)    GI Bleed  . Doxycycline Diarrhea and Nausea And Vomiting  . Sulfa Antibiotics Hives    Possible loss of consciousness  . Ciprofloxacin Rash    "feels like bugs are crawling in my head"  . Lesinurad-Allopurinol Nausea And Vomiting  . Penicillins Rash     Home Medications  Prior to Admission medications   Medication Sig Start Date End Date Taking? Authorizing Provider  ALPRAZolam Prudy Feeler) 0.5 MG tablet Take 0.25-0.5 mg by mouth daily as needed for anxiety.    Yes [provider]  busPIRone (BUSPAR) 15 MG tablet Take 1 tablet by mouth in the morning, at noon, and at bedtime. 07/19/19  Yes [provider]  escitalopram (LEXAPRO) 5 MG tablet Take 5 mg by mouth daily. 07/19/19  Yes [provider]  levothyroxine (SYNTHROID) 25 MCG tablet Take 25 mcg by mouth daily. 10/04/19  Yes [provider]  olmesartan (BENICAR) 20 MG tablet Take 20 mg by mouth 2 (two) times daily.   Yes [provider]  pantoprazole (PROTONIX) 40 MG tablet Take 40 mg by mouth daily as needed (for GERD/Avid reflux).    Yes [provider]  Vitamin D, Ergocalciferol, (DRISDOL) 50000 units CAPS capsule  07/17/16  Yes [provider]  apixaban (ELIQUIS) 5 MG TABS tablet Take 1 tablet (5 mg total) by mouth 2 (two) times daily. 12/23/19   Ghimire, Werner Lean, MD  furosemide (LASIX) 20 MG tablet Take 1 tablet (20 mg total) by mouth daily as needed for edema (swelling). 08/23/16 11/21/16  Antoine Poche, MD  thyroid (ARMOUR) 30 MG tablet Take 30 mg by mouth daily before breakfast. Patient not taking: Reported on 12/11/2019    [provider]     Critical care time: 20 minutes

## 2020-01-12 NOTE — Progress Notes (Signed)
Pt returned to the supine position with RT and RN x4. Cloth tape and protective barriers removed. Small cut to pts left cheek noted and swelling to the bilateral eyes. Per Dr. Kendrick Fries pt to stay supine overnight and will re-evaluate tomorrow. ETT resecured with a hollister tube holder at 23 cm with protective barriers on cheeks. RT will continue to monitor.

## 2020-01-13 ENCOUNTER — Inpatient Hospital Stay (HOSPITAL_COMMUNITY): Payer: 59

## 2020-01-13 ENCOUNTER — Other Ambulatory Visit (HOSPITAL_COMMUNITY): Payer: 59

## 2020-01-13 DIAGNOSIS — J9621 Acute and chronic respiratory failure with hypoxia: Secondary | ICD-10-CM | POA: Diagnosis not present

## 2020-01-13 DIAGNOSIS — J9622 Acute and chronic respiratory failure with hypercapnia: Secondary | ICD-10-CM

## 2020-01-13 DIAGNOSIS — Z7189 Other specified counseling: Secondary | ICD-10-CM

## 2020-01-13 DIAGNOSIS — J80 Acute respiratory distress syndrome: Secondary | ICD-10-CM | POA: Diagnosis not present

## 2020-01-13 DIAGNOSIS — Z66 Do not resuscitate: Secondary | ICD-10-CM

## 2020-01-13 DIAGNOSIS — E119 Type 2 diabetes mellitus without complications: Secondary | ICD-10-CM | POA: Diagnosis not present

## 2020-01-13 DIAGNOSIS — J9620 Acute and chronic respiratory failure, unspecified whether with hypoxia or hypercapnia: Secondary | ICD-10-CM | POA: Diagnosis not present

## 2020-01-13 DIAGNOSIS — Z978 Presence of other specified devices: Secondary | ICD-10-CM | POA: Diagnosis not present

## 2020-01-13 LAB — BPAM RBC
Blood Product Expiration Date: 202111012359
ISSUE DATE / TIME: 202110062059
Unit Type and Rh: 5100

## 2020-01-13 LAB — COMPREHENSIVE METABOLIC PANEL
ALT: 174 U/L — ABNORMAL HIGH (ref 0–44)
AST: 132 U/L — ABNORMAL HIGH (ref 15–41)
Albumin: 1.7 g/dL — ABNORMAL LOW (ref 3.5–5.0)
Alkaline Phosphatase: 133 U/L — ABNORMAL HIGH (ref 38–126)
Anion gap: 7 (ref 5–15)
BUN: 31 mg/dL — ABNORMAL HIGH (ref 6–20)
CO2: 39 mmol/L — ABNORMAL HIGH (ref 22–32)
Calcium: 7.7 mg/dL — ABNORMAL LOW (ref 8.9–10.3)
Chloride: 103 mmol/L (ref 98–111)
Creatinine, Ser: 0.52 mg/dL (ref 0.44–1.00)
GFR calc non Af Amer: >60 mL/min (ref >60)
Glucose, Bld: 124 mg/dL — ABNORMAL HIGH (ref 70–99)
Potassium: 4.7 mmol/L (ref 3.5–5.1)
Sodium: 149 mmol/L — ABNORMAL HIGH (ref 135–145)
Total Bilirubin: 0.7 mg/dL (ref 0.3–1.2)
Total Protein: 4.8 g/dL — ABNORMAL LOW (ref 6.5–8.1)

## 2020-01-13 LAB — GLUCOSE, CAPILLARY
Glucose-Capillary: 97 mg/dL (ref 70–99)
Glucose-Capillary: 138 mg/dL — ABNORMAL HIGH (ref 70–99)
Glucose-Capillary: 144 mg/dL — ABNORMAL HIGH (ref 70–99)
Glucose-Capillary: 130 mg/dL — ABNORMAL HIGH (ref 70–99)
Glucose-Capillary: 105 mg/dL — ABNORMAL HIGH (ref 70–99)

## 2020-01-13 LAB — TYPE AND SCREEN
ABO/RH(D): O POS
Antibody Screen: NEGATIVE
Unit division: 0

## 2020-01-13 LAB — HEPATITIS PANEL, ACUTE
HCV Ab: NONREACTIVE
Hep A IgM: NONREACTIVE
Hep B C IgM: NONREACTIVE
Hepatitis B Surface Ag: NONREACTIVE

## 2020-01-13 LAB — CBC
HCT: 28.6 % — ABNORMAL LOW (ref 36.0–46.0)
Hemoglobin: 8.4 g/dL — ABNORMAL LOW (ref 12.0–15.0)
MCH: 30 pg (ref 26.0–34.0)
MCHC: 29.4 g/dL — ABNORMAL LOW (ref 30.0–36.0)
MCV: 102.1 fL — ABNORMAL HIGH (ref 80.0–100.0)
Platelets: 108 10*3/uL — ABNORMAL LOW (ref 150–400)
RBC: 2.8 MIL/uL — ABNORMAL LOW (ref 3.87–5.11)
RDW: 16.9 % — ABNORMAL HIGH (ref 11.5–15.5)
WBC: 7.9 10*3/uL (ref 4.0–10.5)
nRBC: 6.6 % — ABNORMAL HIGH (ref 0.0–0.2)

## 2020-01-13 MED ORDER — PROSOURCE TF PO LIQD
45.0000 mL | Freq: Two times a day (BID) | ORAL | Status: DC
  Administered 2020-01-13 – 2020-01-14 (×2): 45 mL
  Filled 2020-01-13 (×2): qty 45

## 2020-01-13 MED ORDER — VITAL 1.5 CAL PO LIQD
1000.0000 mL | ORAL | Status: DC
  Administered 2020-01-13 – 2020-01-14 (×2): 1000 mL

## 2020-01-13 MED ORDER — ENOXAPARIN SODIUM 60 MG/0.6ML ~~LOC~~ SOLN
60.0000 mg | SUBCUTANEOUS | Status: DC
  Administered 2020-01-14: 60 mg via SUBCUTANEOUS
  Filled 2020-01-13: qty 0.6

## 2020-01-13 MED ORDER — FREE WATER
200.0000 mL | Status: DC
  Administered 2020-01-13 – 2020-01-14 (×15): 200 mL

## 2020-01-13 NOTE — Consult Note (Signed)
Consultation Note Date: 01/13/2020   Patient Name: Linda Richmond  DOB: 06/29/1965  MRN: [id number]  Age / Sex: 54 y.o., female  PCP: Glenda Chroman, MD Referring Physician: Juanito Doom, MD  Reason for Consultation: Establishing goals of care  HPI/Patient Profile: 54 y.o. female  with past medical history of DM2, HTN, RA, admitted on 12/18/2019 with 1 week history of COVID symptoms. Workup revealed COVID positivity and chest xray positive for COVID pneumonia. Recovery complicated by a-fib with RVR, ongoing respiratory failure, requiring bipap and eventually intubated on 9/25. Transaminases have been trending up.  CO2 elevated, and acidotic. Hgb down to 6.8- required transfusion. Chances of overall survival are very low, and chances of functional recovery are very poor. Palliative consulted for assistance with goals of care.   Clinical Assessment and Goals of Care:  I have reviewed medical records including EPIC notes, labs and imaging, and then spoke with patient's son- Niyana Chesbro by phone. Gwyndolyn Saxon and his sister are patient's living children and primary decision makers- they are making decisions with the support of patient's living parents and siblings.    Gwyndolyn Saxon describes Shaka as a caretaker. She would rather care for others than be taken care of. She loves her Grandchildren. She is a woman of christian faith. She is beloved by her family.   We discussed her current illness and what it means in the larger context of her on-going co-morbidities.     Goals of care important to the patient were elicited- Hennesy would not want to live in a disabled state where she had to be dependent on others for care. Gwyndolyn Saxon is very clear in this. Her goals of care would be to return to a fully functional state of living.   The difference between aggressive medical intervention (continuing current care, medications,  artificial feeding, ventilation and eventual tracheostomy) and comfort care (stopping life prolonging interventions, giving medications for comfort, allowing for peaceful dying process) were presented and discussed including the outcomes of both paths and including the context of patient's preferred goals of care.   Code status was discussed. Gwyndolyn Saxon agreed that CPR would be more traumatic than helpful for patient and and therefore agreed to DNR order in case of cardiac arrest.  Questions and concerns were addressed.  The family was encouraged to call with questions or concerns.   Primary Decision Maker NEXT OF KIN- patient's children- Lana Fish and Marliss Czar    SUMMARY OF RECOMMENDATIONS -DNR -Gwyndolyn Saxon to discuss transition to full comfort with family- he requested follow-up from Palliative provider on Friday    Code Status/Advance Care Planning:  DNR    Psycho-social/Spiritual:   Desire for further Chaplaincy support:yes  Prognosis:    Unable to determine- will likely be hours to days if transitioned to full comfort measures only  Discharge Planning: To Be Determined- would anticipate hospital death if transitioned to full comfort only  Primary Diagnoses: Present on Admission:  GERD (gastroesophageal reflux disease)  Rheumatoid arthritis ([place])   I have reviewed the  medical record, interviewed the patient and family, and examined the patient. The following aspects are pertinent.  Past Medical History:  Diagnosis Date   Anxiety    Depression    Diabetes mellitus without complication (HCC)    GERD (gastroesophageal reflux disease)    Hypertension    Hypothyroidism    Palpitations    Rheumatoid arthritis ([place])    Social History   Socioeconomic History   Marital status: Single    Spouse name: Not on file   Number of children: Not on file   Years of education: Not on file   Highest education level: Not on file  Occupational History   Not on  file  Tobacco Use   Smoking status: Never Smoker   Smokeless tobacco: Never Used  Substance and Sexual Activity   Alcohol use: No   Drug use: No   Sexual activity: Not on file  Other Topics Concern   Not on file  Social History Narrative   Not on file   Social Determinants of Health   Financial Resource Strain:    Difficulty of Paying Living Expenses: Not on file  Food Insecurity:    Worried About [place] in the Last Year: Not on file   Ran Out of Food in the Last Year: Not on file  Transportation Needs:    Lack of Transportation (Medical): Not on file   Lack of Transportation (Non-Medical): Not on file  Physical Activity:    Days of Exercise per Week: Not on file   Minutes of Exercise per Session: Not on file  Stress:    Feeling of Stress : Not on file  Social Connections:    Frequency of Communication with Friends and Family: Not on file   Frequency of Social Gatherings with Friends and Family: Not on file   Attends Religious Services: Not on file   Active Member of Clubs or Organizations: Not on file   Attends Archivist Meetings: Not on file   Marital Status: Not on file   Scheduled Meds:  sodium chloride   Intravenous Once   artificial tears  1 application Both Eyes C1Y   busPIRone  15 mg Per Tube TID   chlorhexidine gluconate (MEDLINE KIT)  15 mL Mouth Rinse BID   Chlorhexidine Gluconate Cloth  6 each Topical Daily   clonazePAM  1 mg Per Tube Q12H   docusate  100 mg Per Tube BID   [START ON 2020-02-07] enoxaparin (LOVENOX) injection  60 mg Subcutaneous Q24H   escitalopram  5 mg Per Tube Daily   famotidine  20 mg Per Tube BID   feeding supplement (PROSource TF)  45 mL Per Tube BID   fluticasone  2 spray Each Nare Daily   free water  200 mL Per Tube Q2H   insulin aspart  0-20 Units Subcutaneous Q4H   insulin aspart  10 Units Subcutaneous Q4H   insulin glargine  30 Units Subcutaneous BID   linagliptin   5 mg Per Tube Daily   mouth rinse  15 mL Mouth Rinse 10 times per day   metoprolol tartrate  50 mg Per Tube BID   oxyCODONE  5 mg Per Tube Q6H   polyethylene glycol  17 g Per Tube Daily   senna  1 tablet Per Tube Daily   sodium chloride flush  10-40 mL Intracatheter Q12H   thyroid  30 mg Per Tube QAC breakfast   Vitamin D (Ergocalciferol)  50,000 Units Oral Q7  days   Continuous Infusions:  sodium chloride Stopped (01/05/20 0215)   ampicillin-sulbactam (UNASYN) IV Stopped (01/13/20 1228)   feeding supplement (VITAL 1.5 CAL) 1,000 mL (01/13/20 1156)   HYDROmorphone 1.5 mg/hr (01/13/20 1300)   midazolam 9 mg/hr (01/13/20 1300)   PRN Meds:.sodium chloride, bisacodyl, dextrose, HYDROmorphone, ipratropium-albuterol, lip balm, metoprolol tartrate, midazolam, oxymetazoline, saline, sodium chloride flush, vecuronium, witch hazel-glycerin Medications Prior to Admission:  Prior to Admission medications   Medication Sig Start Date End Date Taking? Authorizing Provider  ALPRAZolam Duanne Moron) 0.5 MG tablet Take 0.25-0.5 mg by mouth daily as needed for anxiety.    Yes [provider]  busPIRone (BUSPAR) 15 MG tablet Take 1 tablet by mouth in the morning, at noon, and at bedtime. 07/19/19  Yes [provider]  escitalopram (LEXAPRO) 5 MG tablet Take 5 mg by mouth daily. 07/19/19  Yes [provider]  levothyroxine (SYNTHROID) 25 MCG tablet Take 25 mcg by mouth daily. 10/04/19  Yes [provider]  olmesartan (BENICAR) 20 MG tablet Take 20 mg by mouth 2 (two) times daily.   Yes [provider]  pantoprazole (PROTONIX) 40 MG tablet Take 40 mg by mouth daily as needed (for GERD/Avid reflux).    Yes [provider]  Vitamin D, Ergocalciferol, (DRISDOL) 50000 units CAPS capsule  07/17/16  Yes [provider]  apixaban (ELIQUIS) 5 MG TABS tablet Take 1 tablet (5 mg total) by mouth 2 (two) times daily. 12/23/19   Ghimire, Henreitta Leber, MD    furosemide (LASIX) 20 MG tablet Take 1 tablet (20 mg total) by mouth daily as needed for edema (swelling). 08/23/16 11/21/16  Arnoldo Lenis, MD  thyroid (ARMOUR) 30 MG tablet Take 30 mg by mouth daily before breakfast. Patient not taking: Reported on 12/11/2019    [provider]   Allergies  Allergen Reactions   Ancef [Cefazolin] Hives    Tolerated unasyn 01/2020   Aspirin Other (See Comments)    GI Bleed   Doxycycline Diarrhea and Nausea And Vomiting   Sulfa Antibiotics Hives    Possible loss of consciousness   Ciprofloxacin Rash    "feels like bugs are crawling in my head"   Lesinurad-Allopurinol Nausea And Vomiting     Vital Signs: BP 104/63    Pulse 81    Temp 99 F (37.2 C) (Axillary)    Resp (!) 27    Ht 5' 2"  (1.575 m)    Wt 123.5 kg    LMP 04/18/2018    SpO2 99%    BMI 49.80 kg/m  Pain Scale: CPOT POSS *See Group Information*: 1-Acceptable,Awake and alert Pain Score: 0-No pain   SpO2: SpO2: 99 % O2 Device:SpO2: 99 % O2 Flow Rate: .O2 Flow Rate (L/min): 90 L/min  IO: Intake/output summary:   Intake/Output Summary (Last 24 hours) at 01/13/2020 1545 Last data filed at 01/13/2020 1300 Gross per 24 hour  Intake 3930.09 ml  Output 1835 ml  Net 2095.09 ml    LBM: Last BM Date: 01/13/20 Baseline Weight: Weight: 118.8 kg Most recent weight: Weight: 123.5 kg     Palliative Assessment/Data: PPS: 10%     Thank you for this consult. Palliative medicine will continue to follow and assist as needed.   Time Total: 83 mins Greater than 50%  of this time was spent counseling and coordinating care related to the above assessment and plan.  Signed by: Mariana Kaufman, AGNP-C Palliative Medicine    Please contact Palliative Medicine Team phone at [phone]  for questions and concerns.  For individual provider: See Shea Evans

## 2020-01-13 NOTE — Progress Notes (Signed)
NAME:  Linda Richmond, MRN:  [id number], DOB:  08/16/1965, LOS: 34 ADMISSION DATE:  01-Jan-2020, CONSULTATION DATE:  12/28/2019 REFERRING MD:  Dr. Jerral Ralph, CHIEF COMPLAINT:  Hypoxic respiratory failure 2/2 COVID-19  Brief History   54yo female with T2DM, HTN, seronegative RA, anxiety, and depression who presented to [facility] 9/3 with 1 week history of body aches, headache, nasal congestion, and shortness of breath. In ED, saturations in mid-80's on room air, CXR with multifocal opacities, COVID-19 positive. Of note, patient is unvaccinated. Subsequently admitted to [facility] then trasferred to [facility] due to increasing oxygen requirements. Patient's hospital course complicated with Afib with RVR, worsening respiratory failure. Unable to tolerate HHFNC beyond 50L and NRB with saturations in 70's-80's. PCCM consulted for ICU transfer  Past Medical History  T2DM HTN Seronegative RA Anxiety Depression  Significant Hospital Events   9/3 Admitted to [facility] 9/6 Transferred to [facility] given increased oxygen requirements 9/8 Afib with RVR 9/19 Leukocytosis worsening, infiltrates persistent > cefepime addede 9/21 HHFNC and NRB required, PCCM consulted 9/24 Tolerated day off BiPAP and night on BiPAP. Tolerated clear liquids. Failed cortrax with desat, epistaxis 9/25 Intubated 10/6 Transfuse 1u pRBC  Consults:  Cardiology PCCM Palliative  Procedures:  9/16 Single-lumen PICC placement 9/26 Intubation  Significant Diagnostic Tests:  9/3 CXR, bilateral mid-lower lung opacities 9/9 TTE: EF 60-65% 9/16 CXR, airspace opacity throughout mid/lower lungs 9/16 :CXR, interval development of pneumomediastinum 9/17 CXR pneumomediatinum, tiny left apical pneumothorax 9/18 CXR stable, extensive patchy hazy opacities, no pneumonthorax 9/19 CXR persistent airspace opacities over mid-lower lungs bilaterally 9/20 CXR persistent streaky infiltrates to bilateral lungs 9/24 CXR > stable low lung volumes, some interval improvement  to bilateral opacities 9/30 CXR with worsening diffuse bilateral infiltrates 10/3 CXR improved aeration, persistent hazy infiltrates bilaterally 10/4 CXR worsening diffuse bilateral infiltrates 10/5 CXR stable bilateral infiltrates 10/6 CXR stable bilateral infiltrates  Micro Data:  9/3 COVID SARS2 > positive 9/3 BCx 2 > staph epi 1/2 9/13 MRSA PCR > negative  Antimicrobials:  Unasyn 10/3-  Cefepime 9/19-9/23 Baricitinib 9/5-9/18 Decadron 9/3-9/4 Solumedrol 9/5-9/15, 9/20- Remdesivir 9/3-9/7  Interim history/subjective:  No acute events overnight.   Objective   Blood pressure (!) 105/55, pulse 79, temperature 99.3 F (37.4 C), temperature source Axillary, resp. rate (!) 27, height 5\' 2"  (1.575 m), weight 123.5 kg, last menstrual period 04/18/2018, SpO2 96 %.    Vent Mode: PRVC FiO2 (%):  [80 %-100 %] 90 % Set Rate:  [34 bmp] 34 bmp Vt Set:  [410 mL] 410 mL PEEP:  [12 cmH20-14 cmH20] 14 cmH20 Plateau Pressure:  [28 cmH20-34 cmH20] 28 cmH20   Intake/Output Summary (Last 24 hours) at 01/13/2020 0714 Last data filed at 01/13/2020 0600 Gross per 24 hour  Intake 1990.48 ml  Output 2335 ml  Net -344.52 ml   Filed Weights   01/10/20 0500 01/11/20 0328 01/12/20 0432  Weight: 115.7 kg 118 kg 123.5 kg    Examination: General: Intubated, proned HENT: Normocephalic, atraumatic Pulm: Bilateral rales present. Cardiovascular: Regular rate, rhythm. No m/r/g Abdomen: Soft, non-tender, non-distended Extremities: Warm, dry, no edema bilaterally Neuro: Sedated  Resolved Hospital Problem list   Acute Kidney Injury  Assessment & Plan:  [name] is 54yo female with history of diabetes, hypertension, anxiety, depression presented 9/3 with COVID-19 hypoxic respiratory failure, complicated by Afib w/ RVR, currently mechanically ventilated with hypercapnic respiratory acidosis.  #Acute hypoxic respiratory failure d/t ARDS 2/2 COVID-19 PNA #Hx OSA, non-compliant w/ CPAP -ARDSnet  ventilation strategy, including proning -Latest  ABG 7.30 / 79 / 39 -CXR continued bilateral opacities -Afebrile, no leukocytosis present. Day 4 unasyn  #Hyperglycemia on steroids #Hx of T2DM -CBGs appropriate, 120-150 -C/w Lantus 30 BID, Novolog 10 q4, linagliptin 5mg  qd -CBG q4  #Paroxysmal Afib -C/w enteral Lopressor, prophylactic dose Lovenox -Tele  #Hypernatremia 149 < 147 -Increase free water q2 -Monitor CMP  #Anxiety #Sleep disturbance -C/w dilaudid gtt -Intermittent Norcuron (from continuous) -po oxycodone, clonazepam -Okay for RASS score between 0, -3 -TOF while paralyzed  #Dislodged teeth -S/p dislodged teeth on 10/2 -C/w unasyn (day 4)  #Macrocytic Anemia Hgb 8.4 < 6.8 s/p 1u pRBCs 10/6 -R/p CBC today -Transfuse Hgb <7  #Transaminitis AST/ALT 71/114 (52/118 prior) -Continue monitor CMP  #Hypothyroidism -C/w armour daily  #Vitamin D supplementation -Weekly 50,000U given 10/2  #Goals of care -Long hospitalization for patient, unlikely meaningful recovery given high vent settings and extended period of time under mechanical ventilation under critical care management. Continued discussions with family regarding goals.  -Palliative consulted  Best practice:  Diet: NPO, TF Pain/Anxiety/Delirium protocol (if indicated): PAD protocol VAP protocol (if indicated): n/a DVT prophylaxis: Lovenox GI prophylaxis: Famotidine Glucose control: Lantus, linagliptin, SSI Mobility: bed rest Code Status: Full [name] 9/25 with son, daughter. Son asked about prognosis, was explained high risk for mortality in ICU and if survives likely looking at LTAC/Trach and attendant chronic critical illness. He expressed patient was never a fan of DNR, wants everything done and fighting to the end.) Family Communication: Discussed with son, Chrissie Noa, yesterday. Open to palliative talks. Family plans to have discussion on Saturday regarding goals with patient's parents and  children.  Disposition:   Labs   CBC: Recent Labs  Lab 01/07/20 0421 01/07/20 1635 01/08/20 0315 01/08/20 0900 01/09/20 0409 01/09/20 1632 01/10/20 0557 01/10/20 1930 01/11/20 0747 01/11/20 0747 01/11/20 1156 01/11/20 1156 01/11/20 2037 01/12/20 0451 01/12/20 1522 01/12/20 2220 01/13/20 0450  WBC 7.6  --  7.7  --  8.3  --  9.1  --  8.2  --  8.9  --   --  8.3 6.7  --  7.9  NEUTROABS 5.3  --  5.4  --  6.3  --  6.8  --   --   --   --   --   --   --   --   --   --   HGB 10.2*   < > 9.3*   < > 7.6*   < > 8.4*   < > 7.1*   < > 7.7*   < > 7.8* 7.0* 6.8* 8.5* 8.4*  HCT 34.9*   < > 32.4*   < > 26.5*   < > 28.4*   < > 25.0*   < > 27.0*   < > 23.0* 24.3* 24.0* 25.0* 28.6*  MCV 102.6*  --  103.2*  --  104.7*  --  105.2*  --  105.9*  --  106.3*  --   --  106.1* 105.7*  --  102.1*  PLT 110*  --  103*  --  86*  --  100*  --  99*  --  107*  --   --  105* 102*  --  108*   < > = values in this interval not displayed.    Basic Metabolic Panel: Recent Labs  Lab 01/07/20 0421 01/07/20 1635 01/08/20 0315 01/08/20 0900 01/11/20 0414 01/11/20 2037 01/12/20 0451 01/12/20 2220 01/13/20 0450  NA 145   < > 145   < > 147*  147* 147* 147* 149*  K 5.2*   < > 4.9   < > 5.1 5.4* 5.1 4.3 4.7  CL 107  --  106  --  105  --  107  --  103  CO2 33*  --  33*  --  36*  --  36*  --  39*  GLUCOSE 167*  --  176*  --  133*  --  142*  --  124*  BUN 47*  --  41*  --  37*  --  35*  --  31*  CREATININE 0.59  --  0.55  --  0.44  --  0.49  --  0.52  CALCIUM 8.3*  --  7.7*  --  7.6*  --  7.4*  --  7.7*  MG  --   --  2.1  --   --   --   --   --   --    < > = values in this interval not displayed.   GFR: Estimated Creatinine Clearance: 98.5 mL/min (by C-G formula based on SCr of 0.52 mg/dL). Recent Labs  Lab 01/08/20 1249 01/09/20 0409 01/11/20 1156 01/12/20 0451 01/12/20 1522 01/13/20 0450  PROCALCITON 0.28  --   --   --   --   --   WBC  --    < > 8.9 8.3 6.7 7.9   < > = values in this interval not  displayed.    Liver Function Tests: Recent Labs  Lab 01/07/20 1722 01/08/20 0315 01/11/20 0414 01/12/20 0451 01/13/20 0450  AST 71* 52* 47* 71* 132*  ALT 135* 118* 97* 114* 174*  ALKPHOS 97 82 88 96 133*  BILITOT 0.6 0.5 0.5 0.6 0.7  PROT 4.4* 4.1* 4.5* 4.4* 4.8*  ALBUMIN 1.6* 1.4* 1.5* 1.5* 1.7*   No results for input(s): LIPASE, AMYLASE in the last 168 hours. No results for input(s): AMMONIA in the last 168 hours.  ABG    Component Value Date/Time   PHART 7.301 (L) 01/12/2020 2220   PCO2ART 79.2 (HH) 01/12/2020 2220   PO2ART 56 (L) 01/12/2020 2220   HCO3 39.2 (H) 01/12/2020 2220   TCO2 42 (H) 01/12/2020 2220   O2SAT 84.0 01/12/2020 2220      Coagulation Profile: No results for input(s): INR, PROTIME in the last 168 hours.  Cardiac Enzymes: No results for input(s): CKTOTAL, CKMB, CKMBINDEX, TROPONINI in the last 168 hours.  HbA1C: Hgb A1c MFr Bld  Date/Time Value Ref Range Status  12/24/19 03:44 PM 10.8 (H) 4.8 - 5.6 % Final    Comment:    (NOTE) Pre diabetes:          5.7%-6.4%  Diabetes:              >6.4%  Glycemic control for   <7.0% adults with diabetes     CBG: Recent Labs  Lab 01/12/20 1256 01/12/20 1711 01/12/20 2005 01/12/20 2341 01/13/20 0333  GLUCAP 152* 119* 121* 146* 97    Review of Systems:   Unable to assess given sedated, intubated.  Past Medical History  She,  has a past medical history of Anxiety, Depression, Diabetes mellitus without complication (HCC), GERD (gastroesophageal reflux disease), Hypertension, Hypothyroidism, Palpitations, and Rheumatoid arthritis (HCC).   Surgical History    Past Surgical History:  Procedure Laterality Date  . BLADDER SURGERY     X's 3 for interstitial cystitis  . CHOLECYSTECTOMY       Social History   reports that she  has never smoked. She has never used smokeless tobacco. She reports that she does not drink alcohol and does not use drugs.   Family History   Her family history  includes High blood pressure in her mother; Pancreatic cancer in her father.   Allergies Allergies  Allergen Reactions  . Ancef [Cefazolin] Hives    Tolerated unasyn 01/2020  . Aspirin Other (See Comments)    GI Bleed  . Doxycycline Diarrhea and Nausea And Vomiting  . Sulfa Antibiotics Hives    Possible loss of consciousness  . Ciprofloxacin Rash    "feels like bugs are crawling in my head"  . Lesinurad-Allopurinol Nausea And Vomiting     Home Medications  Prior to Admission medications   Medication Sig Start Date End Date Taking? Authorizing Provider  ALPRAZolam Prudy Feeler) 0.5 MG tablet Take 0.25-0.5 mg by mouth daily as needed for anxiety.    Yes [provider]  busPIRone (BUSPAR) 15 MG tablet Take 1 tablet by mouth in the morning, at noon, and at bedtime. 07/19/19  Yes [provider]  escitalopram (LEXAPRO) 5 MG tablet Take 5 mg by mouth daily. 07/19/19  Yes [provider]  levothyroxine (SYNTHROID) 25 MCG tablet Take 25 mcg by mouth daily. 10/04/19  Yes [provider]  olmesartan (BENICAR) 20 MG tablet Take 20 mg by mouth 2 (two) times daily.   Yes [provider]  pantoprazole (PROTONIX) 40 MG tablet Take 40 mg by mouth daily as needed (for GERD/Avid reflux).    Yes [provider]  Vitamin D, Ergocalciferol, (DRISDOL) 50000 units CAPS capsule  07/17/16  Yes [provider]  apixaban (ELIQUIS) 5 MG TABS tablet Take 1 tablet (5 mg total) by mouth 2 (two) times daily. 12/23/19   Ghimire, Werner Lean, MD  furosemide (LASIX) 20 MG tablet Take 1 tablet (20 mg total) by mouth daily as needed for edema (swelling). 08/23/16 11/21/16  Antoine Poche, MD  thyroid (ARMOUR) 30 MG tablet Take 30 mg by mouth daily before breakfast. Patient not taking: Reported on 12/11/2019    [provider]     Critical care time: 20 minutes

## 2020-01-13 NOTE — Progress Notes (Signed)
Nutrition Follow-up  DOCUMENTATION CODES:   Morbid obesity  INTERVENTION:   Continue tube feeding via Cortrak:  -Vital 1.5 at 60 ml/hr (1440 ml per day) -ProSource 45 ml BID -Free water flushes 200 ml Q2  Provides 2240 kcal, 119 gm protein, 1100 ml free water daily (3500 ml with free water)  NUTRITION DIAGNOSIS:   Increased nutrient needs related to acute illness (COVID-19) as evidenced by estimated needs.  Ongoing  GOAL:   Patient will meet greater than or equal to 90% of their needs  Addressed via TF  MONITOR:   PO intake, Supplement acceptance, Labs, Skin  REASON FOR ASSESSMENT:   Consult Enteral/tube feeding initiation and management  ASSESSMENT:   Linda Richmond  is a 54 y.o. female, with past medical history of anxiety, depression, diabetes, GERD, hypertension, vitamin D deficiency, negative rheumatoid arthritis, she presents to ED secondary to shortness of breath, reported began yesterday, patient reports for last week she started to have Covid symptoms including generalized body ache, headache, runny nose, nasal congestion, she was tested positive for COVID-19 on 8/27, prescribed Z-Pak and prednisone, she has finished both without much improvement, reports she started to have dyspnea yesterday, her son is admitted to [facility] due to COVID-19 as well, patient is unvaccinated, patient denies any chest pain, hemoptysis.  9/21 - NPO 9/22 - transferred to ICU, BiPAP 9/24 - Cortrak placement unsuccessful 9/25 - intubated, OG tube place 10/1- Cortrak placed  CXR shows stable bilateral infiltrates. Off paralytic and continues to prone daily. Steroids d/c along with insulin drip. CBGs look to be closer to ICU range. Hypernatremia persists, free water increased per CCM. Palliative to meet with family Saturday to discuss GOC.   Admission weight: 111.7 kg  Current weight: 123.5 kg (up 5 kg from yesterday)  Patient remains intubated on ventilator support MV:  13.1 L/min Temp (24hrs), Avg:98.3 F (36.8 C), Min:97.4 F (36.3 C), Max:99.3 F (37.4 C)  UOP: 2335 ml x 24 hrs   Medications: colace, SS novolog, lantus, tradjenta, miralax, senokot, Vit D Labs: Na 149 (H) LFTs elevated CBG 97- 185  Diet Order:   Diet Order    None      EDUCATION NEEDS:   No education needs have been identified at this time  Skin:  Skin Assessment: Skin Integrity Issues: Skin Integrity Issues:: Other (Comment), Stage II Stage II: buttocks Other: multiple bruises abdomen  Last BM:  10/7 via rectal tube  Height:   Ht Readings from Last 1 Encounters:  12-18-19 5\' 2"  (1.575 m)    Weight:   Wt Readings from Last 1 Encounters:  01/12/20 123.5 kg    Ideal Body Weight:  50 kg   Adjusted Body Weight: 73.4 kg   BMI:  Body mass index is 49.8 kg/m.  Estimated Nutritional Needs:   Kcal:  2114-2628 kcal  Protein:  110-125 grams  Fluid:  >/= 2 L/day  08-02-1983 RD, LDN Clinical Nutrition Pager listed in [facility]

## 2020-01-14 ENCOUNTER — Ambulatory Visit: Payer: 59 | Admitting: Family Medicine

## 2020-01-14 DIAGNOSIS — Z66 Do not resuscitate: Secondary | ICD-10-CM

## 2020-01-14 DIAGNOSIS — E119 Type 2 diabetes mellitus without complications: Secondary | ICD-10-CM | POA: Diagnosis not present

## 2020-01-14 DIAGNOSIS — J9621 Acute and chronic respiratory failure with hypoxia: Secondary | ICD-10-CM | POA: Diagnosis not present

## 2020-01-14 DIAGNOSIS — Z7189 Other specified counseling: Secondary | ICD-10-CM | POA: Diagnosis not present

## 2020-01-14 DIAGNOSIS — Z515 Encounter for palliative care: Secondary | ICD-10-CM

## 2020-01-14 DIAGNOSIS — J80 Acute respiratory distress syndrome: Secondary | ICD-10-CM | POA: Diagnosis not present

## 2020-01-14 LAB — CBC
HCT: 26.2 % — ABNORMAL LOW (ref 36.0–46.0)
Hemoglobin: 7.7 g/dL — ABNORMAL LOW (ref 12.0–15.0)
MCH: 30.8 pg (ref 26.0–34.0)
MCHC: 29.4 g/dL — ABNORMAL LOW (ref 30.0–36.0)
MCV: 104.8 fL — ABNORMAL HIGH (ref 80.0–100.0)
Platelets: 104 10*3/uL — ABNORMAL LOW (ref 150–400)
RBC: 2.5 MIL/uL — ABNORMAL LOW (ref 3.87–5.11)
RDW: 16.8 % — ABNORMAL HIGH (ref 11.5–15.5)
WBC: 6.3 10*3/uL (ref 4.0–10.5)
nRBC: 4.9 % — ABNORMAL HIGH (ref 0.0–0.2)

## 2020-01-14 LAB — COMPREHENSIVE METABOLIC PANEL
ALT: 202 U/L — ABNORMAL HIGH (ref 0–44)
AST: 163 U/L — ABNORMAL HIGH (ref 15–41)
Albumin: 1.4 g/dL — ABNORMAL LOW (ref 3.5–5.0)
Alkaline Phosphatase: 175 U/L — ABNORMAL HIGH (ref 38–126)
Anion gap: 6 (ref 5–15)
BUN: 29 mg/dL — ABNORMAL HIGH (ref 6–20)
CO2: 37 mmol/L — ABNORMAL HIGH (ref 22–32)
Calcium: 7.2 mg/dL — ABNORMAL LOW (ref 8.9–10.3)
Chloride: 102 mmol/L (ref 98–111)
Creatinine, Ser: 0.56 mg/dL (ref 0.44–1.00)
GFR calc non Af Amer: >60 mL/min (ref >60)
Glucose, Bld: 134 mg/dL — ABNORMAL HIGH (ref 70–99)
Potassium: 4.5 mmol/L (ref 3.5–5.1)
Sodium: 145 mmol/L (ref 135–145)
Total Bilirubin: 0.7 mg/dL (ref 0.3–1.2)
Total Protein: 4.4 g/dL — ABNORMAL LOW (ref 6.5–8.1)

## 2020-01-14 LAB — GLUCOSE, CAPILLARY
Glucose-Capillary: 115 mg/dL — ABNORMAL HIGH (ref 70–99)
Glucose-Capillary: 115 mg/dL — ABNORMAL HIGH (ref 70–99)
Glucose-Capillary: 130 mg/dL — ABNORMAL HIGH (ref 70–99)
Glucose-Capillary: 174 mg/dL — ABNORMAL HIGH (ref 70–99)
Glucose-Capillary: 189 mg/dL — ABNORMAL HIGH (ref 70–99)

## 2020-01-14 MED ORDER — POLYVINYL ALCOHOL 1.4 % OP SOLN
1.0000 [drp] | Freq: Four times a day (QID) | OPHTHALMIC | Status: DC | PRN
  Filled 2020-01-14: qty 15

## 2020-01-14 MED ORDER — IBUPROFEN 100 MG/5ML PO SUSP
400.0000 mg | Freq: Once | ORAL | Status: AC
  Administered 2020-01-14: 400 mg
  Filled 2020-01-14: qty 20

## 2020-01-14 MED ORDER — ACETAMINOPHEN 325 MG PO TABS: 650.0000 mg | ORAL_TABLET | Freq: Four times a day (QID) | ORAL | Status: DC | PRN

## 2020-01-14 MED ORDER — DIPHENHYDRAMINE HCL 50 MG/ML IJ SOLN: 25.0000 mg | INTRAMUSCULAR | Status: DC | PRN

## 2020-01-14 MED ORDER — GLYCOPYRROLATE 1 MG PO TABS: 1.0000 mg | ORAL_TABLET | ORAL | Status: DC | PRN

## 2020-01-14 MED ORDER — LACTATED RINGERS IV SOLN: INTRAVENOUS | Status: DC

## 2020-01-14 MED ORDER — DEXTROSE 5 % IV SOLN: INTRAVENOUS | Status: DC

## 2020-01-14 MED ORDER — GLYCOPYRROLATE 0.2 MG/ML IJ SOLN: 0.2000 mg | INTRAMUSCULAR | Status: DC | PRN

## 2020-01-14 MED ORDER — ACETAMINOPHEN 650 MG RE SUPP: 650.0000 mg | Freq: Four times a day (QID) | RECTAL | Status: DC | PRN

## 2020-01-15 NOTE — Progress Notes (Signed)
Wasted 5cc of Versed and 30cc of Dilaudid and witnessed by Natasha Bence RN

## 2020-01-16 NOTE — Progress Notes (Signed)
Waste 50 mg hydromorphone.  Tycho Cheramie/Egan

## 2020-01-24 ENCOUNTER — Ambulatory Visit: Payer: 59 | Admitting: Family Medicine

## 2020-01-31 ENCOUNTER — Ambulatory Visit: Payer: 59 | Admitting: Gastroenterology

## 2020-02-07 NOTE — Procedures (Signed)
Extubation Procedure Note  Patient Details:   Name: Linda Richmond DOB: December 03, 1965 MRN: [id number]          Airway Documentation:    Vent end date: 02/05/2020 Vent end time: 2145   Evaluation  O2 sats: transiently fell during during procedure Complications: No apparent complications Patient did not tolerate procedure well. Bilateral Breath Sounds: Diminished   No   Patient terminally extubated per MD order.   Enrica Corliss R Parish Dubose 01/17/2020, 9:47 PM

## 2020-02-07 NOTE — Progress Notes (Signed)
Family still at bedside and are switching out by two's.

## 2020-02-07 NOTE — Death Summary Note (Signed)
DEATH SUMMARY   Patient Details  Name: Linda Richmond MRN: [id number] DOB: 1966/01/12  Admission/Discharge Information   Admit Date:  2019-12-30  Date of Death: Date of Death: Feb 03, 2020  Time of Death: Time of Death: Aug 12, 2203  Length of Stay: 08-12-34  Referring Physician: Ignatius Specking, MD   Reason(s) for Hospitalization  COVID 19  Diagnoses  Preliminary cause of death:  Secondary Diagnoses (including complications and co-morbidities):  Active Problems:   Pneumonia due to COVID-19 virus   Essential hypertension   Hypothyroid   Diabetes mellitus without complication (HCC)   GERD (gastroesophageal reflux disease)   Rheumatoid arthritis (HCC)   Acute and chronic respiratory failure (acute-on-chronic) (HCC)   Endotracheal tube present   Pressure injury of skin   ARDS (adult respiratory distress syndrome) (HCC)   Advanced care planning/counseling discussion   Goals of care, counseling/discussion   DNR (do not resuscitate)   Palliative care by specialist   Brief Hospital Course (including significant findings, care, treatment, and services provided and events leading to death)  Linda Richmond is a 54 y.o. year old female with T2DM, HTN, seronegative RA, anxiety, and depression who presented to [facility] 12-30-22 with 1 week history of body aches, headache, nasal congestion, and shortness of breath. In ED, saturations in mid-80's on room air, CXR with multifocal opacities, COVID-19 positive. Of note, patient is unvaccinated. Subsequently admitted to [facility] then trasferred to [facility] due to increasing oxygen requirements. Patient's hospital course complicated with Afib with RVR, worsening respiratory failure. Unable to tolerate HHFNC beyond 50L and NRB with saturations in 70's-80's. PCCM consulted for ICU transfer. She was admitted to the ICU on 12/29/19. She was intubated on 01/01/20. She under went prone protocol for ARDS secondary to covid 19 pneumonia. She continued to require increasing amounts of ventilator  support. Family discussions were held and palliative care was involved in the patients care. Ultimately it was decided to transition to comfort care and life supportive measures were withdrawn on 02-03-20. The patient passed away at Aug 12, 2203.       Pertinent Labs and Studies  Significant Diagnostic Studies CT ANGIO CHEST PE W OR WO CONTRAST  Result Date: 01/03/2020 CLINICAL DATA:  54 year old female positive COVID-19.  Intubated. EXAM: CT ANGIOGRAPHY CHEST WITH CONTRAST TECHNIQUE: Multidetector CT imaging of the chest was performed using the standard protocol during bolus administration of intravenous contrast. Multiplanar CT image reconstructions and MIPs were obtained to evaluate the vascular anatomy. CONTRAST:  OMNIPAQUE IOHEXOL 350 MG/ML SOLN COMPARISON:  Portable chest radiographs 0931 hours today and earlier. Report of prior Chest CTA 07/01/2007 (no images available). FINDINGS: Cardiovascular: Adequate contrast bolus timing in the pulmonary arterial tree. Respiratory motion. No convincing pulmonary artery filling defect. Evidence of calcified coronary artery atherosclerosis on series 8, image 125. Cardiac size within normal limits. No pericardial effusion. Negative visible aorta. Mediastinum/Nodes: Pneumomediastinum and pneumopericardium, small volume overall. No gas tracking into the visible lower neck or below the diaphragm at this time. No superimposed mediastinal hematoma or lymphadenopathy. Enteric tube courses through the esophagus. Lungs/Pleura: Endotracheal tube tip terminates about 14 mm above the carina. Major airways are patent. Diffuse bilateral pulmonary ground-glass opacity with scattered areas of confluence in the perihilar lungs and both lower lobes. Some lower lobe areas are trending toward consolidation. Pneumomediastinum associated gas along the medial upper pleura. No convincing pneumothorax. No pleural effusion. Upper Abdomen: Hepatic steatosis. Absent gallbladder. Negative visible  spleen, pancreas, adrenal glands, kidneys. Enteric tube terminates in the distal gastric body.  Negative visible other bowel loops. No free air or free fluid in the visible peritoneal cavity. Musculoskeletal: No acute osseous abnormality identified. Review of the MIP images confirms the above findings. IMPRESSION: 1. Negative for pulmonary embolus. 2. Positive for pneumomediastinum and pneumopericardium, small volume overall. No gas tracking into the visible lower neck or below the diaphragm. No convincing pneumothorax. 3. Diffuse bilateral pulmonary ground-glass opacity compatible with COVID-19 Pneumonia. Some lower lobe opacity trending toward consolidation. No pleural effusion. 4. Satisfactory placement of ET tube and enteric tube. 5. Hepatic steatosis. Electronically Signed   By: Odessa Fleming M.D.   On: 01/03/2020 11:50   DG CHEST PORT 1 VIEW  Result Date: 01/12/2020 CLINICAL DATA:  COVID-19 positivity with ARDS EXAM: PORTABLE CHEST 1 VIEW COMPARISON:  01/11/2020 FINDINGS: Endotracheal tube, feeding catheter and right-sided PICC line are again noted and stable. Cardiac shadow is stable. Bibasilar airspace opacities are noted consistent with the given clinical history. The overall appearance is stable from the prior study. No acute bony abnormality is noted. IMPRESSION: No change from the previous day. Electronically Signed   By: Alcide Clever M.D.   On: 01/12/2020 20:46   DG CHEST PORT 1 VIEW  Result Date: 01/11/2020 CLINICAL DATA:  ARDS EXAM: PORTABLE CHEST 1 VIEW COMPARISON:  01/10/2020, 01/09/2020, 01/06/2020 FINDINGS: Endotracheal tube tip is about 2 cm superior to the carina. Right upper extremity central venous catheter tip over the cavoatrial region. Esophageal tube tip below the diaphragm but incompletely visualized. Slightly improved aeration at the left lung base. Heterogeneous airspace opacities in the mid to lower lungs are otherwise unchanged. No pneumothorax. IMPRESSION: Slightly improved aeration  at the left lung base. Heterogeneous airspace opacities in the mid to lower lungs bilaterally are otherwise unchanged. Electronically Signed   By: Jasmine Pang M.D.   On: 01/11/2020 19:08   DG CHEST PORT 1 VIEW  Result Date: 01/10/2020 CLINICAL DATA:  ARDS. EXAM: PORTABLE CHEST 1 VIEW COMPARISON:  01/09/2020 FINDINGS: The endotracheal tube terminates above the carina by approximately 2 cm. The right-sided PICC line is well position. The enteric tube extends below the left hemidiaphragm. Diffuse bilateral hazy airspace opacities are again noted with a lower lobe predominance. These opacities appear to have slightly worsened since the prior exam. There is no pneumothorax. There are probable trace to small bilateral pleural effusions. IMPRESSION: 1. Lines and tubes as above. 2. Persistent diffuse bilateral airspace opacities, slightly worsened since prior study. 3. Probable trace to small bilateral pleural effusions. Electronically Signed   By: Katherine Mantle M.D.   On: 01/10/2020 19:18   DG CHEST PORT 1 VIEW  Result Date: 01/09/2020 CLINICAL DATA:  Respiratory failure. Covid + on 9.3.21 Pt intubated. EXAM: PORTABLE CHEST 1 VIEW COMPARISON:  Chest radiograph 01/06/2020 FINDINGS: Stable cardiomediastinal contours. Unchanged endotracheal tube and right upper extremity PICC. Interval placement of an orogastric tube with distal aspect below the diaphragm out of field of view. Improved aeration in the bilateral lungs with persistent hazy infiltrates diffusely. No pneumothorax or large pleural effusion. IMPRESSION: Improved aeration in the bilateral lungs with persistent hazy infiltrates diffusely. Electronically Signed   By: Emmaline Kluver M.D.   On: 01/09/2020 17:22   DG Chest Port 1 View  Result Date: 01/06/2020 CLINICAL DATA:  COVID positive with respiratory failure. EXAM: PORTABLE CHEST 1 VIEW COMPARISON:  January 03, 2020 FINDINGS: There is stable endotracheal tube, nasogastric tube and  right-sided PICC line positioning. Moderate severity diffuse bilateral infiltrates are seen. This is increased in severity  when compared to the prior study. There is no evidence of a pleural effusion or pneumothorax. The cardiac silhouette is mildly enlarged and unchanged in size. Radiopaque surgical clips are seen overlying the right upper quadrant. The visualized skeletal structures are unremarkable. IMPRESSION: Moderate severity diffuse bilateral infiltrates, increased in severity when compared to the prior study. Electronically Signed   By: Aram Candela M.D.   On: 01/06/2020 20:05   DG CHEST PORT 1 VIEW  Result Date: 01/03/2020 CLINICAL DATA:  Shortness of breath and hypoxia EXAM: PORTABLE CHEST 1 VIEW COMPARISON:  01/03/2020 FINDINGS: Endotracheal tube, right PICC line, and enteric tube are unchanged in position. Cardiac enlargement. Bilateral perihilar and basilar infiltrates. Infiltrates appear more dense than on previous study suggesting mild progression. Changes could be due to multifocal pneumonia or edema. No pneumothorax. No visible pneumomediastinum. IMPRESSION: Bilateral perihilar and basilar infiltrates with mild progression since previous study. Electronically Signed   By: Burman Nieves M.D.   On: 01/03/2020 22:06   DG Chest Port 1 View  Result Date: 01/03/2020 CLINICAL DATA:  Pneumothorax. EXAM: PORTABLE CHEST 1 VIEW COMPARISON:  01/02/2020. FINDINGS: Endotracheal tube and NG tube in stable position. PICC line in unchanged position with tip over right atrium. Heart size stable. Interim improvement of bilateral pulmonary infiltrates with mild to moderate residual. No pleural effusion or pneumothorax. Degenerative changes scoliosis thoracic spine. Surgical clips right upper quadrant. IMPRESSION: 1. Endotracheal tube and NG tube in stable position. PICC line in unchanged position with tip over right atrium. 2. Interim improvement of bilateral pulmonary infiltrates with mild to moderate  residual. Electronically Signed   By: Maisie Fus  Register   On: 01/03/2020 09:45   DG CHEST PORT 1 VIEW  Result Date: 01/03/2020 CLINICAL DATA:  Endotracheal tube follow-up EXAM: PORTABLE CHEST 1 VIEW COMPARISON:  01/02/2020 FINDINGS: Endotracheal tube, enteric tube, and right PICC line are unchanged in position. Shallow inspiration. Cardiac enlargement. Bilateral perihilar and basilar pulmonary infiltrates, likely multifocal pneumonia or edema. Progression since previous study. No pleural effusions. Minimal right apical pneumothorax and pneumomediastinum, similar prior study. IMPRESSION: Progression of bilateral perihilar and basilar pulmonary infiltrates, likely multifocal pneumonia or edema. Minimal right apical pneumothorax and pneumomediastinum. These results will be called to the ordering clinician or representative by the Radiologist Assistant, and communication documented in the PACS or [employer]. Electronically Signed   By: Burman Nieves M.D.   On: 01/03/2020 05:10   DG CHEST PORT 1 VIEW  Result Date: 01/02/2020 CLINICAL DATA:  ETT EXAM: PORTABLE CHEST 1 VIEW COMPARISON:  Chest radiograph January 01, 2020 FINDINGS: ET tube terminates in the mid trachea. Right upper extremity PICC line tip projects over the right atrium. Enteric tube courses inferior to the diaphragm. Monitoring leads overlie the patient. Stable cardiac and mediastinal contours. Possible lucency around the are suggestive of pneumomediastinum. Similar-appearing bilateral mid lower lung airspace opacities. No pneumothorax. IMPRESSION: 1. Possible small volume lucency around the heart suggestive of pneumomediastinum. 2. Similar-appearing bilateral mid lower lung airspace opacities. 3. Right upper extremity PICC line tip projects over the right atrium. 4. These results will be called to the ordering clinician or representative by the Radiologist Assistant, and communication documented in the PACS or [employer].  Electronically Signed   By: Annia Belt M.D.   On: 01/02/2020 13:07   DG CHEST PORT 1 VIEW  Result Date: 01/01/2020 CLINICAL DATA:  Check endotracheal tube placement EXAM: PORTABLE CHEST 1 VIEW COMPARISON:  Film from the previous day. FINDINGS: New endotracheal tube is  noted with the tip in the left mainstem bronchus. This should be withdrawn at least 3.5 cm. Gastric catheter is noted within the stomach. Right-sided PICC line is seen in satisfactory position. Bibasilar infiltrates are again seen. IMPRESSION: Endotracheal tube in the left mainstem bronchus. This should be withdrawn as described above. Electronically Signed   By: Alcide Clever M.D.   On: 01/01/2020 12:35   DG CHEST PORT 1 VIEW  Result Date: 01/01/2020 CLINICAL DATA:  Check endotracheal tube placement EXAM: PORTABLE CHEST 1 VIEW COMPARISON:  01/01/20 FINDINGS: Cardiac shadow is stable. Gastric catheter is noted in the distal stomach. Endotracheal tube is noted at the level of the carina directed towards the left mainstem bronchus and should be withdrawn 2-3 cm. Right-sided PICC line is noted in satisfactory position. IMPRESSION: Endotracheal tube at the level of the carina extending into the left mainstem bronchus. This should be withdrawn somewhat. Electronically Signed   By: Alcide Clever M.D.   On: 01/01/2020 12:34   DG CHEST PORT 1 VIEW  Result Date: 12/31/2019 CLINICAL DATA:  Acute on chronic respiratory failure EXAM: PORTABLE CHEST 1 VIEW COMPARISON:  12/28/2018 FINDINGS: Lung volumes are small, but are symmetric and are stable since prior examination. Superimposed mid and lower lung zone airspace infiltrate has improved slightly since prior examination. No pneumothorax or pleural effusion. Right upper extremity PICC line tip seen within the right atrium. Cardiac size within normal limits. Pulmonary vascularity is normal. IMPRESSION: Stable pulmonary hypoinflation. Improving bibasilar pulmonary infiltrates. Moderate infiltrate persists.  Electronically Signed   By: Helyn Numbers MD   On: 12/31/2019 06:53   DG Chest Port 1 View  Result Date: 12/28/2019 CLINICAL DATA:  Shortness of breath. EXAM: PORTABLE CHEST 1 VIEW COMPARISON:  12/27/2019. FINDINGS: PICC line noted stable position. Heart size stable. Low lung volumes. Diffuse severe bilateral pulmonary interstitial infiltrates are again noted. No pleural effusion or pneumothorax. Heart size normal. IMPRESSION: 1.  PICC line stable position. 2. Low lung volumes. Persistent diffuse severe bilateral interstitial infiltrates. Electronically Signed   By: Maisie Fus  Register   On: 12/28/2019 12:11   DG Chest Port 1 View  Result Date: 12/26/2019 CLINICAL DATA:  Shortness of breath. EXAM: PORTABLE CHEST 1 VIEW COMPARISON:  12/25/2019 FINDINGS: Right-sided PICC line has tip over the SVC. Lungs are hypoinflated demonstrate persistent hazy airspace process over the mid to lower lungs bilaterally without significant change. No effusion. Cardiomediastinal silhouette and remainder of the exam is unchanged. IMPRESSION: Persistent hazy airspace process over the mid to lower lungs bilaterally without significant change compatible with multifocal viral pneumonia in this COVID-19 positive patient. Electronically Signed   By: Elberta Fortis M.D.   On: 12/26/2019 09:01   DG Chest Port 1 View  Result Date: 12/25/2019 CLINICAL DATA:  COVID, dyspnea EXAM: PORTABLE CHEST 1 VIEW COMPARISON:  Chest radiograph from one day prior. FINDINGS: Low lung volumes. Right PICC terminates over the cavoatrial junction. Stable cardiomediastinal silhouette with normal heart size. No pneumothorax. No pleural effusion. Extensive patchy hazy opacities throughout both lungs, most prominent in the lower lungs, unchanged. IMPRESSION: Stable extensive patchy hazy opacities throughout both lungs, most prominent in the lower lungs, compatible with COVID-19 pneumonia. Electronically Signed   By: Delbert Phenix M.D.   On: 12/25/2019 11:19    DG CHEST PORT 1 VIEW  Result Date: 12/23/2019 CLINICAL DATA:  PICC line placement EXAM: PORTABLE CHEST 1 VIEW COMPARISON:  12/23/2019 FINDINGS: Right upper extremity PICC line has been placed with its tip at the  expected superior cavoatrial junction. Lung volumes are small and superimposed asymmetric airspace infiltrates are again identified, more severe within the left lung base, likely infectious or inflammatory in nature. There has, however, developed linear lucencies within the mediastinum and left hilum likely representing at least mild pneumomediastinum. Additionally, lucencies have developed at the lung apices bilaterally either representing tiny bilateral pneumothoraces, or, more likely, gas tracking within the subpleural space at the thoracic inlet given associated pneumomediastinum. Cardiac size within normal limits.  No acute bone abnormality. IMPRESSION: 1. Right upper extremity PICC line with its tip at the expected superior cavoatrial junction. 2. Interval development of pneumomediastinum. 3. Lucencies at the lung apices bilaterally either representing tiny bilateral pneumothoraces, or, more likely,, gas tracking within the subpleural space at the thoracic inlet. This could be confirmed with CT imaging or reassessed with short-term follow-up radiographs. Electronically Signed   By: Helyn Numbers MD   On: 12/23/2019 18:39   DG Chest Port 1 View  Result Date: 12/23/2019 CLINICAL DATA:  Shortness of breath EXAM: PORTABLE CHEST 1 VIEW COMPARISON:  December 14, 2019 FINDINGS: There is airspace opacity throughout both mid and lower lung regions, similar to prior study. No consolidation. Heart is borderline enlarged with pulmonary vascularity normal. No adenopathy. No bone lesions. IMPRESSION: Airspace opacity throughout both mid and lower lung zones, similar to prior study, likely due to atypical organism pneumonia. No consolidation. Stable cardiac silhouette. No evident adenopathy. Electronically  Signed   By: Bretta Bang III M.D.   On: 12/23/2019 08:21   DG Chest Port 1V same Day  Result Date: 12/27/2019 CLINICAL DATA:  Shortness of breath. EXAM: PORTABLE CHEST 1 VIEW COMPARISON:  Chest radiograph 12/26/2019 FINDINGS: Stable cardiomediastinal contours. Redemonstrated right upper extremity PICC. Low lung volumes. Persistent streaky infiltrates in the bilateral mid to lower lungs, similar to prior. No new focal consolidation. No evidence of pneumothorax or large pleural effusion. IMPRESSION: Persistent streaky infiltrates in the bilateral mid to lower lungs, similar to prior. Electronically Signed   By: Emmaline Kluver M.D.   On: 12/27/2019 08:22   DG Chest Port 1V same Day  Result Date: 12/24/2019 CLINICAL DATA:  COVID-19 positive. Short of breath. Follow-up exam. EXAM: PORTABLE CHEST 1 VIEW COMPARISON:  12/23/2019. FINDINGS: Small left apical pneumothorax is similar to the previous day's exam. No visualized right pneumothorax on the current study. Pneumo mediastinum is stable. Hazy bilateral airspace lung opacities are unchanged. No new lung abnormalities. Right PICC has its tip in the lower superior vena cava, also unchanged IMPRESSION: 1. Tiny left apical pneumothorax appears similar to the previous day's exam. 2. No current evidence of a right pneumothorax. 3. Pneumo mediastinum 4. Stable well-positioned right PICC. 5. No change in bilateral hazy airspace lung opacities. Electronically Signed   By: Amie Portland M.D.   On: 12/24/2019 16:31   DG Abd Portable 1V  Result Date: 01/01/2020 CLINICAL DATA:  Check gastric catheter placement EXAM: PORTABLE ABDOMEN - 1 VIEW COMPARISON:  None. FINDINGS: Gastric catheter is noted with the tip in the distal stomach. Scattered large and small bowel gas is noted. IMPRESSION: Gastric catheter within the stomach. Electronically Signed   By: Alcide Clever M.D.   On: 01/01/2020 12:31   [place] EKG SITE RITE  Result Date: 01/01/2020 If Site Rite image not  attached, placement could not be confirmed due to current cardiac rhythm.  [place] EKG Site Rite  Result Date: 12/23/2019 If Site Rite image not attached, placement could not be confirmed due to  current cardiac rhythm.  US Abdomen Limited RUQ  Result Date: 01/13/2020 CLINICAL DATA:  54 year old female with abnormal LFTs. COVID-19. Intubated. History of cholecystectomy. EXAM: ULTRASOUND ABDOMEN LIMITED RIGHT UPPER QUADRANT COMPARISON:  CTA chest 01/03/2020. CT Abdomen and Pelvis report 12/29/2015 (no images available). FINDINGS: Gallbladder: Surgically absent Common bile duct: Diameter: 9 mm, within normal limits for the post cholecystectomy state. Liver: Echogenic liver (image 21). No intrahepatic biliary ductal dilatation. No discrete liver lesion. Portal vein is patent on color Doppler imaging with normal direction of blood flow towards the liver. Other: Negative visible right kidney. IMPRESSION: Hepatic steatosis.  No acute finding by ultrasound. Electronically Signed   By: Odessa Fleming M.D.   On: 01/13/2020 16:52    Microbiology No results found for this or any previous visit (from the past 240 hour(s)).  Lab Basic Metabolic Panel: Recent Labs  Lab 01/11/20 0414 01/11/20 0414 01/11/20 2037 01/12/20 0451 01/12/20 2220 01/13/20 0450 01/27/2020 0356  NA 147*   < > 147* 147* 147* 149* 145  K 5.1   < > 5.4* 5.1 4.3 4.7 4.5  CL 105  --   --  107  --  103 102  CO2 36*  --   --  36*  --  39* 37*  GLUCOSE 133*  --   --  142*  --  124* 134*  BUN 37*  --   --  35*  --  31* 29*  CREATININE 0.44  --   --  0.49  --  0.52 0.56  CALCIUM 7.6*  --   --  7.4*  --  7.7* 7.2*   < > = values in this interval not displayed.   Liver Function Tests: Recent Labs  Lab 01/11/20 0414 01/12/20 0451 01/13/20 0450 01/24/2020 0356  AST 47* 71* 132* 163*  ALT 97* 114* 174* 202*  ALKPHOS 88 96 133* 175*  BILITOT 0.5 0.6 0.7 0.7  PROT 4.5* 4.4* 4.8* 4.4*  ALBUMIN 1.5* 1.5* 1.7* 1.4*   No results for input(s):  LIPASE, AMYLASE in the last 168 hours. No results for input(s): AMMONIA in the last 168 hours. CBC: Recent Labs  Lab 01/11/20 1156 01/11/20 2037 01/12/20 0451 01/12/20 1522 01/12/20 2220 01/13/20 0450 01/16/2020 0356  WBC 8.9  --  8.3 6.7  --  7.9 6.3  HGB 7.7*   < > 7.0* 6.8* 8.5* 8.4* 7.7*  HCT 27.0*   < > 24.3* 24.0* 25.0* 28.6* 26.2*  MCV 106.3*  --  106.1* 105.7*  --  102.1* 104.8*  PLT 107*  --  105* 102*  --  108* 104*   < > = values in this interval not displayed.   Cardiac Enzymes: No results for input(s): CKTOTAL, CKMB, CKMBINDEX, TROPONINI in the last 168 hours. Sepsis Labs: Recent Labs  Lab 01/12/20 0451 01/12/20 1522 01/13/20 0450 01/13/2020 0356  WBC 8.3 6.7 7.9 6.3    Procedures/Operations  9/16 Single-lumen PICC placement 9/26 Intubation   Martina Sinner 01/17/2020, 3:40 PM

## 2020-02-07 NOTE — Progress Notes (Signed)
   Palliative Medicine Inpatient Follow Up Note  Reason for Consultation: Establishing goals of care  HPI/Patient Profile: 54 y.o. female  with past medical history of DM2, HTN, RA, admitted on 2019/12/28 with 1 week history of COVID symptoms. Workup revealed COVID positivity and chest xray positive for COVID pneumonia. Recovery complicated by a-fib with RVR, ongoing respiratory failure, requiring bipap and eventually intubated on 9/25. Transaminases have been trending up.  CO2 elevated, and acidotic. Hgb down to 6.8- required transfusion. Chances of overall survival are very low, and chances of functional recovery are very poor. Palliative consulted for assistance with goals of care.   Today's Discussion (01/27/2020): Chart reviewed. I spoke with Dr. Marijo Conception who shares that patient has required increased ventilation settings and remains to be saturating poorly. He called patient son Linda Richmond this afternoon to share the poor prognosis with him. I was able to follow up with Linda Richmond. Per our conversation he has some additional family members he plans on meeting with this late afternoon. He and his sister, Linda Richmond are both leaning towards making Linda Richmond comfortable. He would like though to give his other family members the opportunity to provide their input.  We reviewed what comfort measures in house look like inclusive of medications to control pain, dyspnea, agitation, nausea, itching, and hiccups.  We discussed stopping all uneccessary measures such as blood draws, needle sticks, and frequent vital signs. We discussed extubating her which would likely result in a rapid decline.   Mayford Knife shares that Linda Richmond has two grandchildren who would like to be able to see her. We reviewed the visitation policy which is 47 years old and above. Offered that for anyone else we are happy to set up a facetime or E-Link visit.   Offered additional spiritual support though Linda Richmond's pastor plans on coming in.  Discussed the  importance of continued conversation with family and their  medical providers regarding overall plan of care and treatment options, ensuring decisions are within the context of the patients values and GOCs.  Questions and concerns addressed   SUMMARY OF RECOMMENDATIONS - DNR -  Family to come in this early evening to decide on next steps in terms of liberation from ventilatory support and transition of focus to comfort oriented care.  Time Spent: 35 Greater than 50% of the time was spent in counseling and coordination of care ______________________________________________________________________________________ Lamarr Lulas [facility] Palliative Medicine Team Team Cell Phone: [phone] Please utilize secure chat with additional questions, if there is no response within 30 minutes please call the above phone number  Palliative Medicine Team providers are available by phone from 7am to 7pm daily and can be reached through the team cell phone.  Should this patient require assistance outside of these hours, please call the patient's attending physician.

## 2020-02-07 NOTE — Progress Notes (Signed)
eLink Physician-Brief Progress Note Patient Name: Linda Richmond DOB: November 11, 1965 MRN: [id number]   Date of Service  01/24/20  HPI/Events of Note  Fever with abnormal LFT's. Creatinine is normal.  eICU Interventions  Ibuprofen 400 mg via NG tube x 1        Luisa Louk U Najee Cowens 01-24-2020, 12:48 AM

## 2020-02-07 NOTE — Progress Notes (Signed)
Called [facility] to report TOD Pt suitable for Tissue donation Spoke with [employer]

## 2020-02-07 NOTE — Progress Notes (Addendum)
Pt's time of death: 2205 on 01/10/2020   Auscultated heart and lung sound x1 min along w/Alma Trisha Mangle RN ... No lungs or heart sounds auscultated.    No visible chest rise seen   Printed asystole strip and placed in chart  Notified eLink and spoke w/Cameron RN

## 2020-02-07 NOTE — Progress Notes (Addendum)
NAME:  Linda Richmond, MRN:  [id number], DOB:  [birth year], LOS: 71 ADMISSION DATE:  12/17/2019, CONSULTATION DATE:  12/28/2019 REFERRING MD:  Dr. Sloan Leiter, CHIEF COMPLAINT:  Hypoxic respiratory failure 2/2 COVID-19  Brief History   54yo female with T2DM, HTN, seronegative RA, anxiety, and depression who presented to [facility] 9/3 with 1 week history of body aches, headache, nasal congestion, and shortness of breath. In ED, saturations in mid-80's on room air, CXR with multifocal opacities, COVID-19 positive. Of note, patient is unvaccinated. Subsequently admitted to [facility] then trasferred to [facility] due to increasing oxygen requirements. Patient's hospital course complicated with Afib with RVR, worsening respiratory failure. Unable to tolerate HHFNC beyond 50L and NRB with saturations in 70's-80's. PCCM consulted for ICU transfer  Past Medical History  T2DM HTN Seronegative RA Anxiety Depression  Significant Hospital Events   9/3 Admitted to [facility] 9/6 Transferred to [facility] given increased oxygen requirements 9/8 Afib with RVR 9/19 Leukocytosis worsening, infiltrates persistent > cefepime addede 9/21 HHFNC and NRB required, PCCM consulted 9/24 Tolerated day off BiPAP and night on BiPAP. Tolerated clear liquids. Failed cortrax with desat, epistaxis 9/25 Intubated 10/6 Transfuse 1u pRBC  Consults:  Cardiology PCCM Palliative  Procedures:  9/16 Single-lumen PICC placement 9/26 Intubation  Significant Diagnostic Tests:  9/3 CXR, bilateral mid-lower lung opacities 9/9 TTE: EF 60-65% 9/16 CXR, airspace opacity throughout mid/lower lungs 9/16 :CXR, interval development of pneumomediastinum 9/17 CXR pneumomediatinum, tiny left apical pneumothorax 9/18 CXR stable, extensive patchy hazy opacities, no pneumonthorax 9/19 CXR persistent airspace opacities over mid-lower lungs bilaterally 9/20 CXR persistent streaky infiltrates to bilateral lungs 9/24 CXR > stable low lung volumes, some interval improvement  to bilateral opacities 9/30 CXR with worsening diffuse bilateral infiltrates 10/3 CXR improved aeration, persistent hazy infiltrates bilaterally 10/4 CXR worsening diffuse bilateral infiltrates 10/5 CXR stable bilateral infiltrates 10/6 CXR stable bilateral infiltrates 10/7 RUQ u/s hepatic steatosis, no duct dilation or lesions  Micro Data:  9/3 COVID SARS2 > positive 9/3 BCx 2 > staph epi 1/2 9/13 MRSA PCR > negative  Antimicrobials:  Unasyn 10/3-  Cefepime 9/19-9/23 Baricitinib 9/5-9/18 Decadron 9/3-9/4 Solumedrol 9/5-9/15, 9/20- Remdesivir 9/3-9/7  Interim history/subjective:  No acute events overnight.   Objective   Blood pressure 120/69, pulse 80, temperature 98.7 F (37.1 C), temperature source Oral, resp. rate (!) 34, height 5' 2"  (1.575 m), weight 122.9 kg, last menstrual period 04/18/2018, SpO2 95 %.    Vent Mode: PRVC FiO2 (%):  [90 %-100 %] 90 % Set Rate:  [34 bmp] 34 bmp Vt Set:  [410 mL] 410 mL PEEP:  [14 cmH20] 14 cmH20 Plateau Pressure:  [32 cmH20-36 cmH20] 32 cmH20   Intake/Output Summary (Last 24 hours) at 01/18/2020 0755 Last data filed at 01/18/2020 0700 Gross per 24 hour  Intake 3278.03 ml  Output 1090 ml  Net 2188.03 ml   Filed Weights   01/11/20 0328 01/12/20 0432 01/17/2020 0400  Weight: 118 kg 123.5 kg 122.9 kg    Examination: General: Intubated, proned HENT: Normocephalic, atraumatic Pulm: Bilateral rales present. Cardiovascular: Regular rate, rhythm. No m/r/g Abdomen: Soft, non-tender, non-distended Extremities: Warm, dry, no edema bilaterally Neuro: [facility] Problem list   Acute Kidney Injury  Assessment & Plan:  [name] is 54yo female with history of diabetes, hypertension, anxiety, depression presented 9/3 with COVID-19 hypoxic respiratory failure, complicated by Afib w/ RVR, currently mechanically ventilated with hypercapnic respiratory acidosis.  #Acute hypoxic respiratory failure d/t ARDS 2/2 COVID-19  PNA #Hx OSA, non-compliant  w/ CPAP -ARDSnet ventilation strategy, d/c proning 2/2 facial skin breakdown -CXR unchanged over last few days -Afebrile, no leukocytosis present. Day 6/7 unasyn -Working with RT to decrease plateau pressure  #Hyperglycemia on steroids #Hx of T2DM -CBGs appropriate, 120-150 -C/w Lantus 30 BID, Novolog 10 q4, linagliptin 26m qd -CBG q4  #Transaminitis -AST/ALT 163/202 < 71/114 (52/118 prior) -Alk Phos 175 < 133 -Hepatitis panel neg -RUQ u/s w/ hepatic steatosis, no duct dilation or lesions -Acute liver injury without clear etiology, will give IVF for support  #Paroxysmal Afib -C/w enteral Lopressor, prophylactic dose Lovenox -Tele  #Hypernatremia 145 < 149 -C/w free water q2 -Monitor CMP  #Anxiety #Sleep disturbance -C/w dilaudid gtt -Intermittent Norcuron (from continuous) -po oxycodone, clonazepam -Okay for RASS score between 0, -3 -TOF while paralyzed  #Dislodged teeth -S/p dislodged teeth on 10/2 -C/w unasyn (day 6/7)  #Macrocytic Anemia Hgb 7.7 < 8.4  -Monitor CBC -Transfuse Hgb <7  #Hypothyroidism -C/w armour daily  #Vitamin D supplementation -Weekly 50,000U given 10/2  #Goals of care -Long hospitalization for patient, unlikely meaningful recovery given high vent settings and extended period of time under mechanical ventilation under critical care management. Continued discussions with family regarding goals.  -Palliative consulted -Patient DNR  Best practice:  Diet: NPO, TF Pain/Anxiety/Delirium protocol (if indicated): PAD protocol VAP protocol (if indicated): n/a DVT prophylaxis: Lovenox GI prophylaxis: Famotidine Glucose control: Lantus, linagliptin, SSI Mobility: bed rest Code Status: DNR Disposition:   Labs   CBC: Recent Labs  Lab 01/08/20 0315 01/08/20 0900 01/09/20 0409 01/09/20 1632 01/10/20 0557 01/10/20 1930 01/11/20 1156 01/11/20 2037 01/12/20 0451 01/12/20 1522 01/12/20 2220 01/13/20 0450  [id number]  WBC 7.7  --  8.3  --  9.1   < > 8.9  --  8.3 6.7  --  7.9 6.3  NEUTROABS 5.4  --  6.3  --  6.8  --   --   --   --   --   --   --   --   HGB 9.3*   < > 7.6*   < > 8.4*   < > 7.7*   < > 7.0* 6.8* 8.5* 8.4* 7.7*  HCT 32.4*   < > 26.5*   < > 28.4*   < > 27.0*   < > 24.3* 24.0* 25.0* 28.6* 26.2*  MCV 103.2*  --  104.7*  --  105.2*   < > 106.3*  --  106.1* 105.7*  --  102.1* 104.8*  PLT 103*  --  86*  --  100*   < > 107*  --  105* 102*  --  108* 104*   < > = values in this interval not displayed.    Basic Metabolic Panel: Recent Labs  Lab 01/08/20 0315 01/08/20 0900 01/11/20 0414 01/11/20 0414 01/11/20 2037 01/12/20 0451 01/12/20 2220 01/13/20 0450 [id number]  NA 145   < > 147*   < > 147* 147* 147* 149* 145  K 4.9   < > 5.1   < > 5.4* 5.1 4.3 4.7 4.5  CL 106  --  105  --   --  107  --  103 102  CO2 33*  --  36*  --   --  36*  --  39* 37*  GLUCOSE 176*  --  133*  --   --  142*  --  124* 134*  BUN 41*  --  37*  --   --  35*  --  31* 29*  CREATININE 0.55  --  0.44  --   --  0.49  --  0.52 0.56  CALCIUM 7.7*  --  7.6*  --   --  7.4*  --  7.7* 7.2*  MG 2.1  --   --   --   --   --   --   --   --    < > = values in this interval not displayed.   GFR: Estimated Creatinine Clearance: 98.2 mL/min (by C-G formula based on SCr of 0.56 mg/dL). Recent Labs  Lab 01/08/20 1249 01/09/20 0409 01/12/20 0451 01/12/20 1522 01/13/20 0450 02-08-20 0356  PROCALCITON 0.28  --   --   --   --   --   WBC  --    < > 8.3 6.7 7.9 6.3   < > = values in this interval not displayed.    Liver Function Tests: Recent Labs  Lab 01/08/20 0315 01/11/20 0414 01/12/20 0451 01/13/20 0450 2020-02-08 0356  AST 52* 47* 71* 132* 163*  ALT 118* 97* 114* 174* 202*  ALKPHOS 82 88 96 133* 175*  BILITOT 0.5 0.5 0.6 0.7 0.7  PROT 4.1* 4.5* 4.4* 4.8* 4.4*  ALBUMIN 1.4* 1.5* 1.5* 1.7* 1.4*   No results for input(s): LIPASE, AMYLASE in the last 168 hours. No results for input(s): AMMONIA in the  last 168 hours.  ABG    Component Value Date/Time   PHART 7.301 (L) 01/12/2020 2220   PCO2ART 79.2 (HH) 01/12/2020 2220   PO2ART 56 (L) 01/12/2020 2220   HCO3 39.2 (H) 01/12/2020 2220   TCO2 42 (H) 01/12/2020 2220   O2SAT 84.0 01/12/2020 2220      Coagulation Profile: No results for input(s): INR, PROTIME in the last 168 hours.  Cardiac Enzymes: No results for input(s): CKTOTAL, CKMB, CKMBINDEX, TROPONINI in the last 168 hours.  HbA1C: Hgb A1c MFr Bld  Date/Time Value Ref Range Status  12/23/2019 03:44 PM 10.8 (H) 4.8 - 5.6 % Final    Comment:    (NOTE) Pre diabetes:          5.7%-6.4%  Diabetes:              >6.4%  Glycemic control for   <7.0% adults with diabetes     CBG: Recent Labs  Lab 01/13/20 1138 01/13/20 1455 01/13/20 1955 02-08-2020 0010 2020/02/08 0342  GLUCAP 144* 130* 105* 130* 115*    Review of Systems:   Unable to assess given sedated, intubated.  Past Medical History  She,  has a past medical history of Anxiety, Depression, Diabetes mellitus without complication ([place]), GERD (gastroesophageal reflux disease), Hypertension, Hypothyroidism, Palpitations, and Rheumatoid arthritis ([place]).   Surgical History    Past Surgical History:  Procedure Laterality Date  . BLADDER SURGERY     X's 3 for interstitial cystitis  . CHOLECYSTECTOMY       Social History   reports that she has never smoked. She has never used smokeless tobacco. She reports that she does not drink alcohol and does not use drugs.   Family History   Her family history includes High blood pressure in her mother; Pancreatic cancer in her father.   Allergies Allergies  Allergen Reactions  . Ancef [Cefazolin] Hives    Tolerated unasyn 01/2020  . Aspirin Other (See Comments)    GI Bleed  . Doxycycline Diarrhea and Nausea And Vomiting  . Sulfa Antibiotics Hives    Possible loss of consciousness  . Ciprofloxacin Rash    "  feels like bugs are crawling in my head"  .  Lesinurad-Allopurinol Nausea And Vomiting     Home Medications  Prior to Admission medications   Medication Sig Start Date End Date Taking? Authorizing Provider  ALPRAZolam Duanne Moron) 0.5 MG tablet Take 0.25-0.5 mg by mouth daily as needed for anxiety.    Yes [provider]  busPIRone (BUSPAR) 15 MG tablet Take 1 tablet by mouth in the morning, at noon, and at bedtime. 07/19/19  Yes [provider]  escitalopram (LEXAPRO) 5 MG tablet Take 5 mg by mouth daily. 07/19/19  Yes [provider]  levothyroxine (SYNTHROID) 25 MCG tablet Take 25 mcg by mouth daily. 10/04/19  Yes [provider]  olmesartan (BENICAR) 20 MG tablet Take 20 mg by mouth 2 (two) times daily.   Yes [provider]  pantoprazole (PROTONIX) 40 MG tablet Take 40 mg by mouth daily as needed (for GERD/Avid reflux).    Yes [provider]  Vitamin D, Ergocalciferol, (DRISDOL) 50000 units CAPS capsule  07/17/16  Yes [provider]  apixaban (ELIQUIS) 5 MG TABS tablet Take 1 tablet (5 mg total) by mouth 2 (two) times daily. 12/23/19   Ghimire, Henreitta Leber, MD  furosemide (LASIX) 20 MG tablet Take 1 tablet (20 mg total) by mouth daily as needed for edema (swelling). 08/23/16 11/21/16  Arnoldo Lenis, MD  thyroid (ARMOUR) 30 MG tablet Take 30 mg by mouth daily before breakfast. Patient not taking: Reported on 12/11/2019    [provider]     Critical care time: 25 minutes

## 2020-02-07 NOTE — Progress Notes (Signed)
[address] and spoke w/Latasha Arroyo [place]ondo. Referral has been made for possible organ donor today, 01-22-2020 at 1946.   Shortly after, rec'd call from [facility] who sts pt is not a candidate for organ donor at this time d/t COVID but to call once time of death has been pronounced for possible tissue and eye donor candidate.

## 2020-02-07 DEATH — deceased

## 2021-10-12 IMAGING — DX DG CHEST 1V PORT
1 series · 1 of 1 positions shown · non-contrast
Comparison: 12/28/2018

CLINICAL DATA: Acute on chronic respiratory failure

EXAM:
PORTABLE CHEST 1 VIEW

[chest ap]
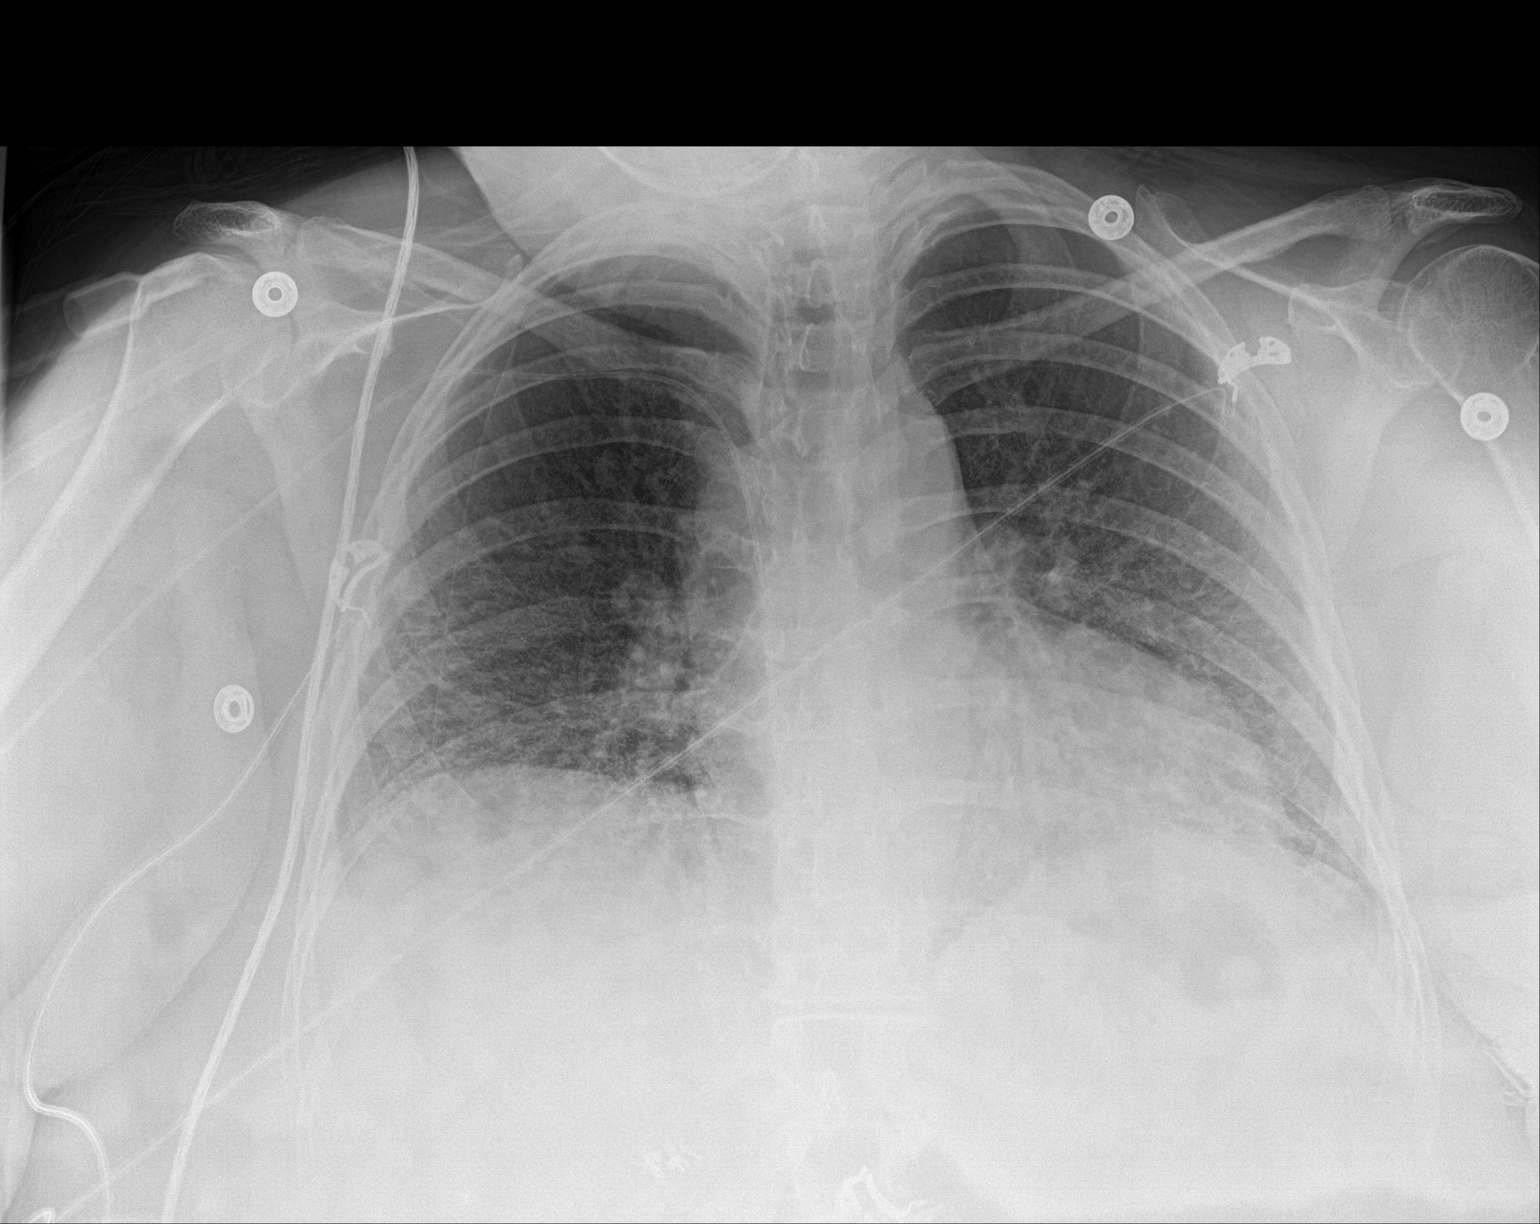

[1 of 1 positions shown; findings below may reference images not displayed]

FINDINGS: Lung volumes are small, but are symmetric and are stable since prior
examination. Superimposed mid and lower lung zone airspace
infiltrate has improved slightly since prior examination. No
pneumothorax or pleural effusion. Right upper extremity PICC line
tip seen within the right atrium. Cardiac size within normal limits.
Pulmonary vascularity is normal.
IMPRESSION: Stable pulmonary hypoinflation.

Improving bibasilar pulmonary infiltrates. Moderate infiltrate
persists.

## 2021-10-13 IMAGING — DX DG ABD PORTABLE 1V
1 series · 1 of 1 positions shown · non-contrast
Comparison: None.

CLINICAL DATA: Check gastric catheter placement

EXAM:
PORTABLE ABDOMEN - 1 VIEW

[abdomen kub]
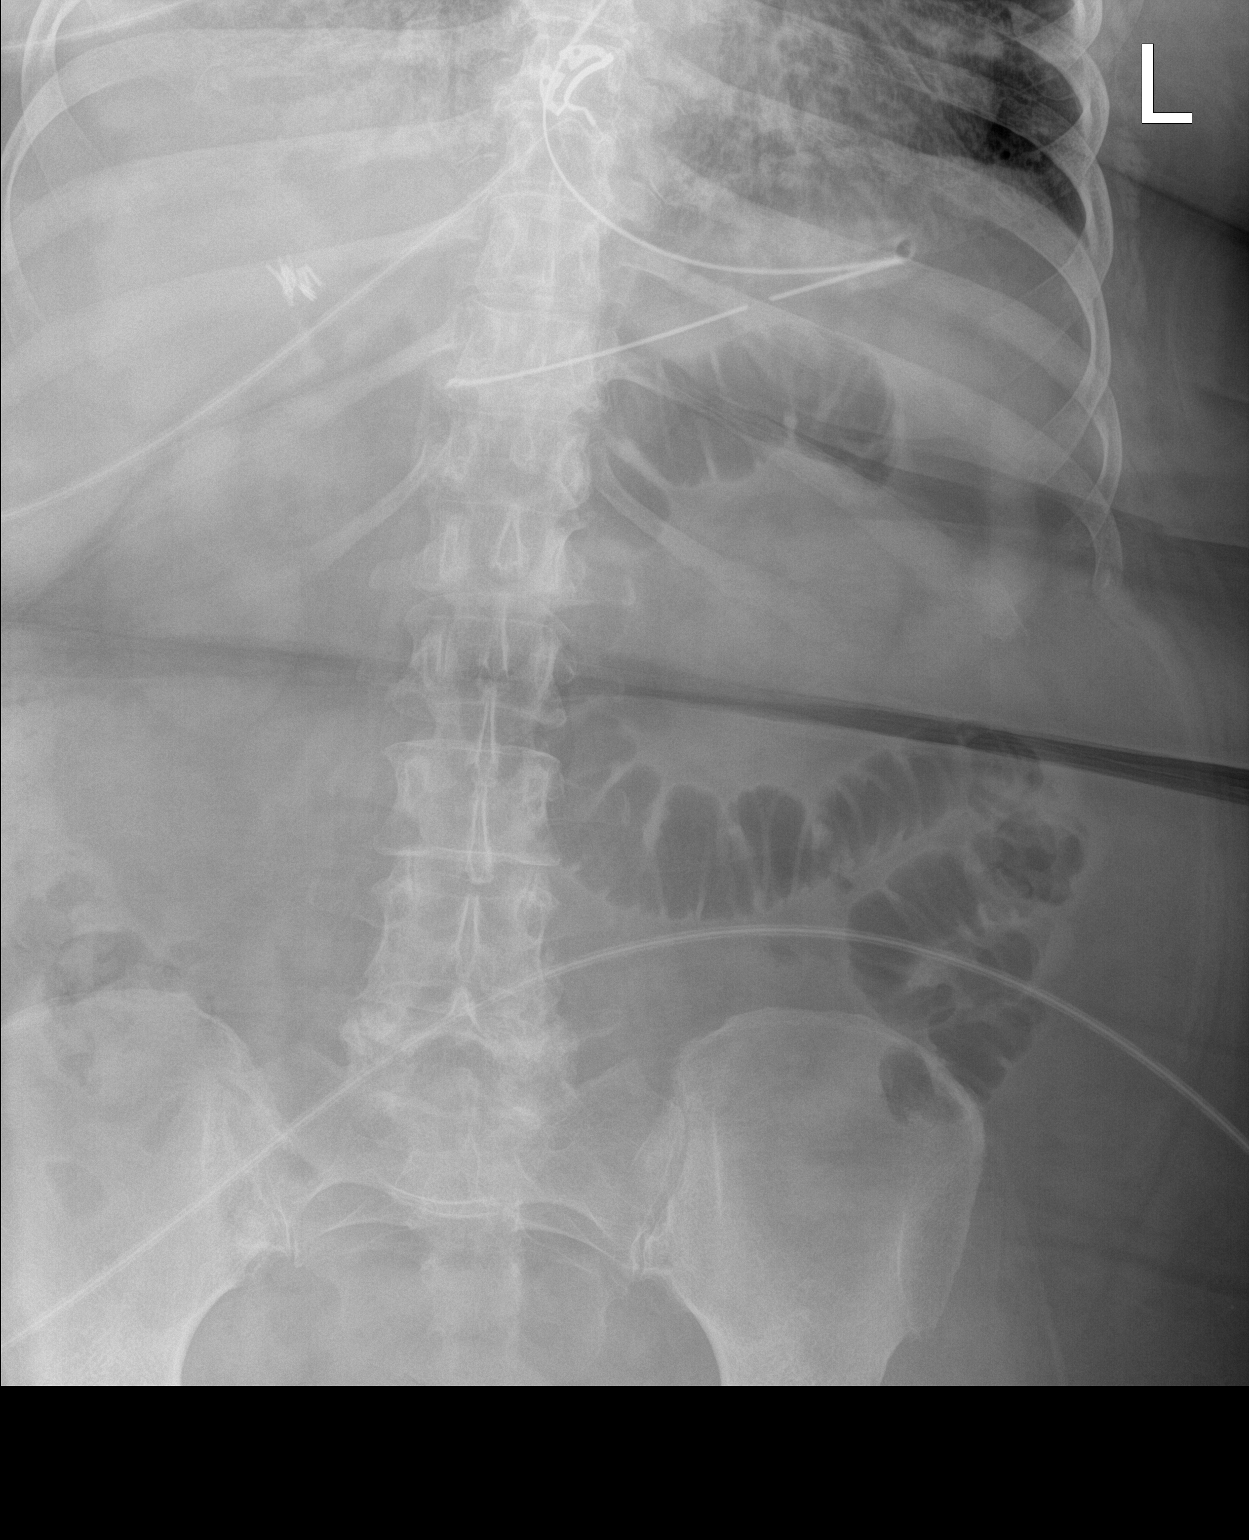

[1 of 1 positions shown; findings below may reference images not displayed]

FINDINGS: Gastric catheter is noted with the tip in the distal stomach.
Scattered large and small bowel gas is noted.
IMPRESSION: Gastric catheter within the stomach.

## 2021-10-15 IMAGING — CT CT ANGIO CHEST
2 of 6 series · 18 of 46 positions shown · IV contrast (APPLIED)
Comparison: Portable chest radiographs 4723 hours today and
earlier. Report of prior Chest CTA 07/01/2007 (no images available).

CLINICAL DATA: 54-year-old female positive 24F4O-1O.  Intubated.

EXAM:
CT ANGIOGRAPHY CHEST WITH CONTRAST
TECHNIQUE: Multidetector CT imaging of the chest was performed using the
standard protocol during bolus administration of intravenous
contrast. Multiplanar CT image reconstructions and MIPs were
obtained to evaluate the vascular anatomy.
CONTRAST:  100mL OMNIPAQUE IOHEXOL 350 MG/ML SOLN

[Series 8: thins · axial · 0.80mm/px · z∈[-441,-153]mm · 15 of 316 slices shown]
[im 14/316  lung]
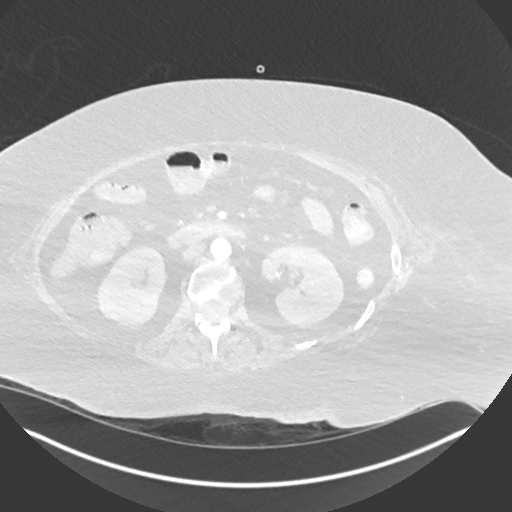
[im 42/316  soft-tissue]
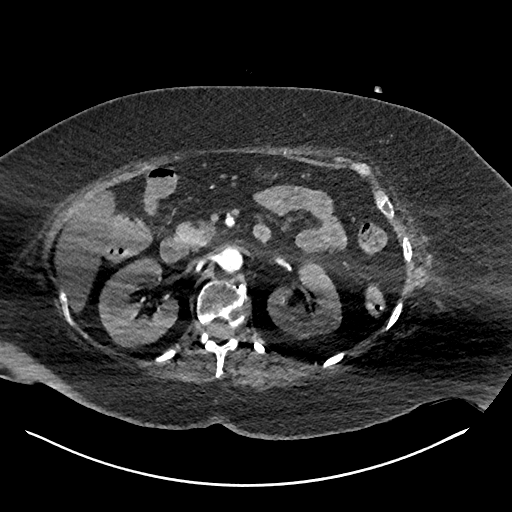
[im 55/316  lung]
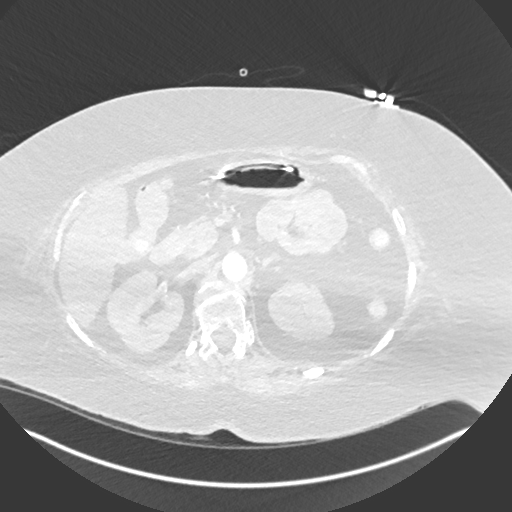
[im 83/316  soft-tissue]
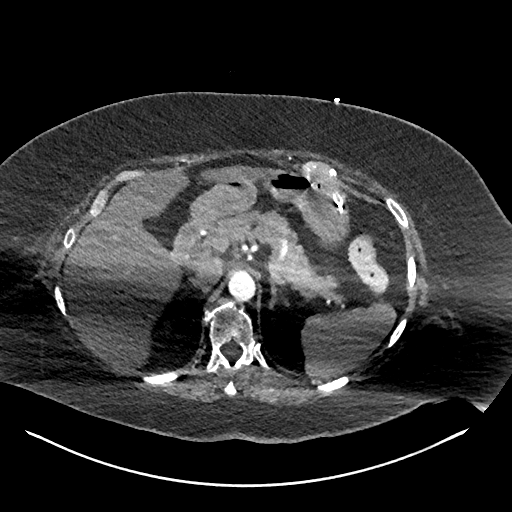
[im 96/316  lung]
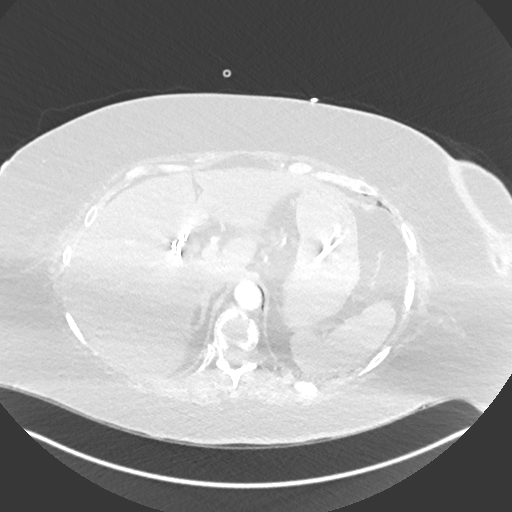
[im 124/316  soft-tissue]
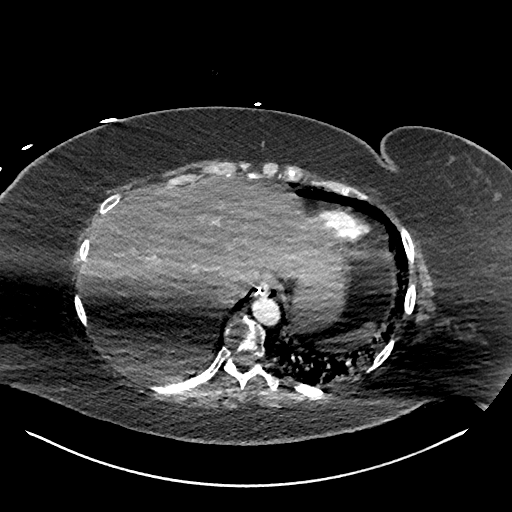
[im 137/316  lung]
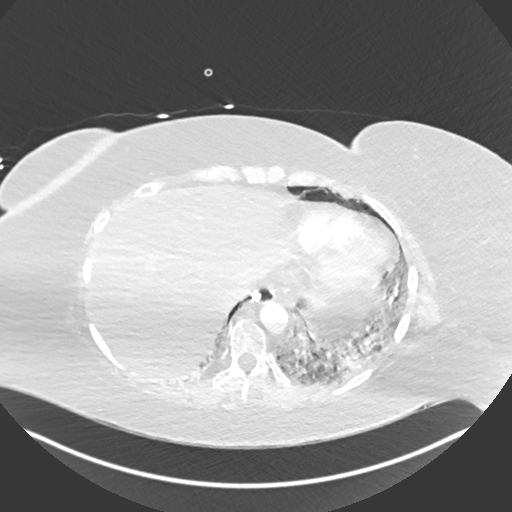
[im 165/316  soft-tissue]
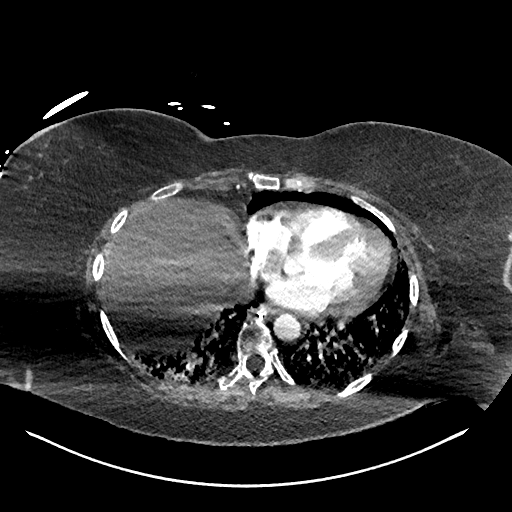
[im 179/316  lung]
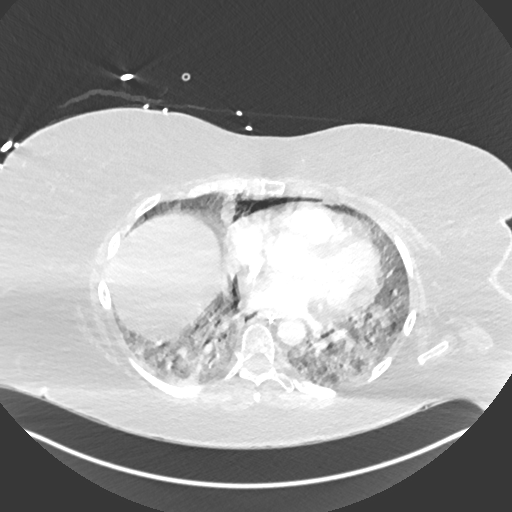
[im 192/316  soft-tissue]
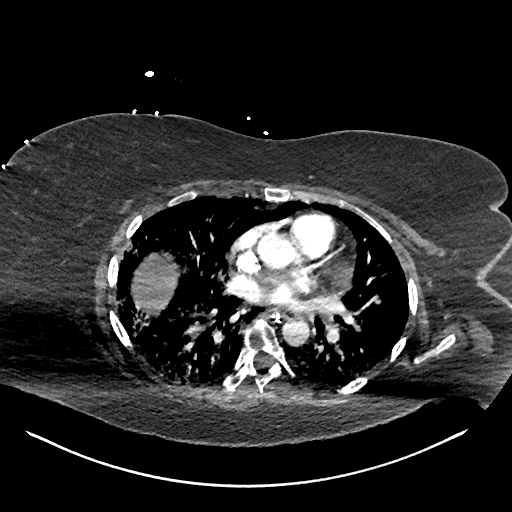
[im 220/316  lung]
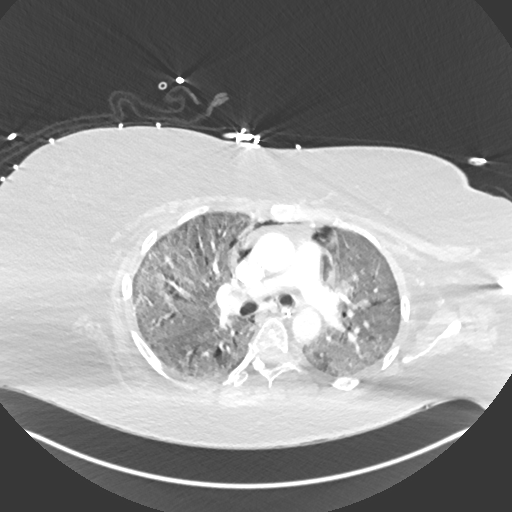
[im 233/316  soft-tissue]
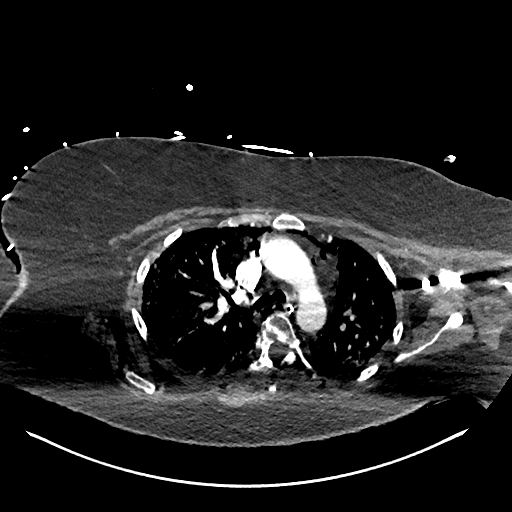
[im 261/316  lung]
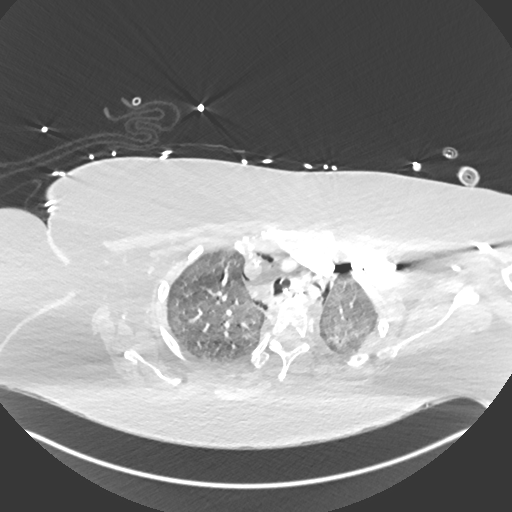
[im 274/316  soft-tissue]
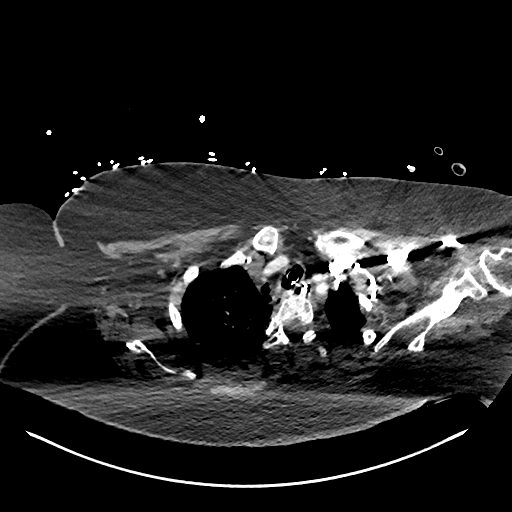
[im 302/316  lung]
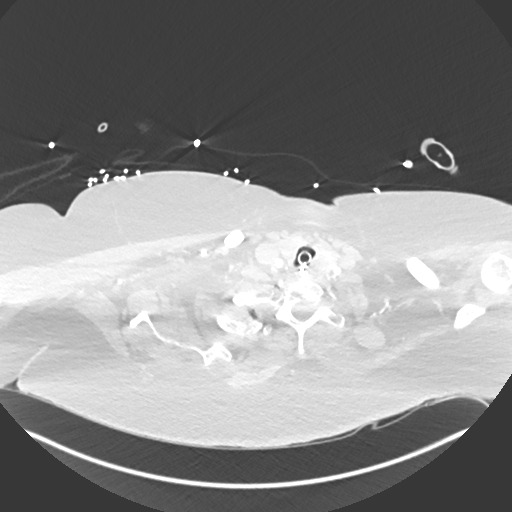

[Series 10: coronal mpr · coronal · 0.62mm/px · 3 of 107 slices shown]
[im 27/107  soft-tissue]
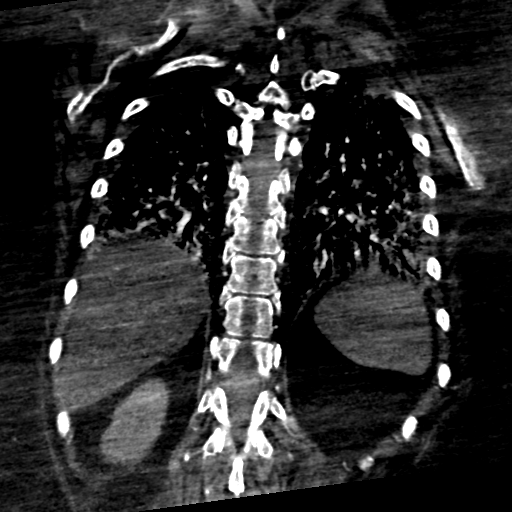
[im 54/107  soft-tissue]
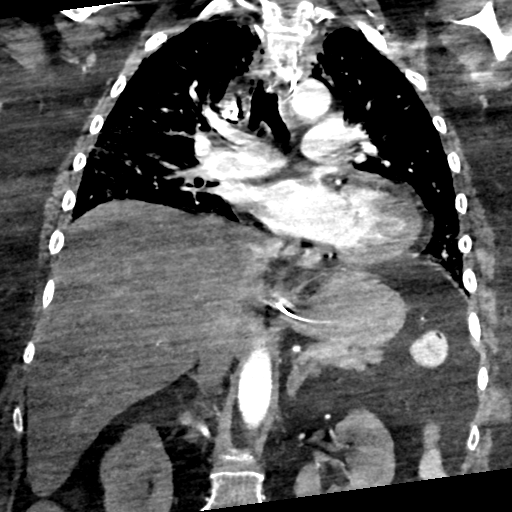
[im 80/107  soft-tissue]
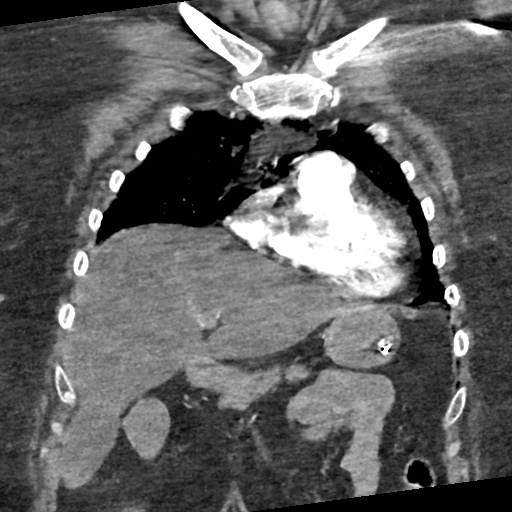

[18 of 46 positions shown; findings below may reference images not displayed]

FINDINGS: Cardiovascular: Adequate contrast bolus timing in the pulmonary
arterial tree. Respiratory motion. No convincing pulmonary artery
filling defect.

Evidence of calcified coronary artery atherosclerosis on series 8,
image 125. Cardiac size within normal limits. No pericardial
effusion. Negative visible aorta.

Mediastinum/Nodes: Pneumomediastinum and pneumopericardium, small
volume overall. No gas tracking into the visible lower neck or below
the diaphragm at this time. No superimposed mediastinal hematoma or
lymphadenopathy. Enteric tube courses through the esophagus.

Lungs/Pleura: Endotracheal tube tip terminates about 14 mm above the
carina. Major airways are patent.

Diffuse bilateral pulmonary ground-glass opacity with scattered
areas of confluence in the perihilar lungs and both lower lobes.
Some lower lobe areas are trending toward consolidation.

Pneumomediastinum associated gas along the medial upper pleura. No
convincing pneumothorax. No pleural effusion.

Upper Abdomen: Hepatic steatosis. Absent gallbladder. Negative
visible spleen, pancreas, adrenal glands, kidneys. Enteric tube
terminates in the distal gastric body. Negative visible other bowel
loops. No free air or free fluid in the visible peritoneal cavity.

Musculoskeletal: No acute osseous abnormality identified.

Review of the MIP images confirms the above findings.
IMPRESSION: 1. Negative for pulmonary embolus.

2. Positive for pneumomediastinum and pneumopericardium, small
volume overall. No gas tracking into the visible lower neck or below
the diaphragm. No convincing pneumothorax.

3. Diffuse bilateral pulmonary ground-glass opacity compatible with
24F4O-1O Pneumonia. Some lower lobe opacity trending toward
consolidation. No pleural effusion.

4. Satisfactory placement of ET tube and enteric tube.

5. Hepatic steatosis.
